# Patient Record
Sex: Female | Born: 1970
Health system: Southern US, Community
[De-identification: ages and names within clinical notes are randomized; demographics above are authoritative.]

## PROBLEM LIST (undated history)

## (undated) DIAGNOSIS — I509 Heart failure, unspecified: Secondary | ICD-10-CM

## (undated) DIAGNOSIS — I10 Essential (primary) hypertension: Secondary | ICD-10-CM

## (undated) DIAGNOSIS — G4733 Obstructive sleep apnea (adult) (pediatric): Secondary | ICD-10-CM

## (undated) DIAGNOSIS — E119 Type 2 diabetes mellitus without complications: Secondary | ICD-10-CM

## (undated) DIAGNOSIS — I2510 Atherosclerotic heart disease of native coronary artery without angina pectoris: Secondary | ICD-10-CM

## (undated) HISTORY — DX: Obstructive sleep apnea (adult) (pediatric): G47.33

---

## 2011-02-09 ENCOUNTER — Other Ambulatory Visit: Payer: Self-pay | Admitting: Orthopedic Surgery

## 2011-02-09 DIAGNOSIS — M545 Low back pain: Secondary | ICD-10-CM

## 2011-02-14 ENCOUNTER — Ambulatory Visit
Admission: RE | Admit: 2011-02-14 | Discharge: 2011-02-14 | Disposition: A | Discharge status: Home / Self Care | Payer: Worker's Compensation | Source: Ambulatory Visit | Attending: Orthopedic Surgery | Admitting: Orthopedic Surgery

## 2011-02-14 DIAGNOSIS — M545 Low back pain: Secondary | ICD-10-CM

## 2011-05-04 ENCOUNTER — Encounter (HOSPITAL_COMMUNITY)
Admission: RE | Admit: 2011-05-04 | Discharge: 2011-05-04 | Disposition: A | Discharge status: Home / Self Care | Payer: Worker's Compensation | Source: Ambulatory Visit | Attending: Orthopedic Surgery | Admitting: Orthopedic Surgery

## 2011-05-04 ENCOUNTER — Other Ambulatory Visit (HOSPITAL_COMMUNITY): Payer: Self-pay | Admitting: Orthopedic Surgery

## 2011-05-04 ENCOUNTER — Ambulatory Visit (HOSPITAL_COMMUNITY)
Admission: RE | Admit: 2011-05-04 | Discharge: 2011-05-04 | Disposition: A | Discharge status: Home / Self Care | Payer: Worker's Compensation | Source: Ambulatory Visit | Attending: Orthopedic Surgery | Admitting: Orthopedic Surgery

## 2011-05-04 DIAGNOSIS — Z01812 Encounter for preprocedural laboratory examination: Secondary | ICD-10-CM | POA: Insufficient documentation

## 2011-05-04 DIAGNOSIS — M5126 Other intervertebral disc displacement, lumbar region: Secondary | ICD-10-CM

## 2011-05-04 DIAGNOSIS — Z01818 Encounter for other preprocedural examination: Secondary | ICD-10-CM | POA: Insufficient documentation

## 2011-05-04 DIAGNOSIS — Z01810 Encounter for preprocedural cardiovascular examination: Secondary | ICD-10-CM | POA: Insufficient documentation

## 2011-05-04 LAB — DIFFERENTIAL
Basophils Relative: 0 % (ref 0–1)
Eosinophils Absolute: 0.2 10*3/uL (ref 0.0–0.7)
Eosinophils Relative: 2 % (ref 0–5)
Lymphs Abs: 2.5 10*3/uL (ref 0.7–4.0)
Monocytes Absolute: 0.6 10*3/uL (ref 0.1–1.0)
Monocytes Relative: 5 % (ref 3–12)
Neutrophils Relative %: 71 % (ref 43–77)

## 2011-05-04 LAB — CBC
MCH: 24.4 pg — ABNORMAL LOW (ref 26.0–34.0)
MCHC: 31.5 g/dL (ref 30.0–36.0)
MCV: 77.5 fL — ABNORMAL LOW (ref 78.0–100.0)
Platelets: 367 10*3/uL (ref 150–400)
RDW: 17.9 % — ABNORMAL HIGH (ref 11.5–15.5)

## 2011-05-04 LAB — URINALYSIS, ROUTINE W REFLEX MICROSCOPIC
Ketones, ur: NEGATIVE mg/dL
Leukocytes, UA: NEGATIVE
Nitrite: NEGATIVE
Protein, ur: NEGATIVE mg/dL

## 2011-05-04 LAB — COMPREHENSIVE METABOLIC PANEL
ALT: 30 U/L (ref 0–35)
CO2: 26 mEq/L (ref 19–32)
Calcium: 10 mg/dL (ref 8.4–10.5)
GFR calc Af Amer: >60 mL/min (ref >60)
GFR calc non Af Amer: >60 mL/min (ref >60)
Glucose, Bld: 264 mg/dL — ABNORMAL HIGH (ref 70–99)
Sodium: 135 mEq/L (ref 135–145)
Total Bilirubin: 0.2 mg/dL — ABNORMAL LOW (ref 0.3–1.2)

## 2011-05-04 LAB — TYPE AND SCREEN

## 2011-05-04 LAB — APTT: aPTT: 26 seconds (ref 24–37)

## 2011-05-04 LAB — ABO/RH: ABO/RH(D): A POS

## 2011-05-08 ENCOUNTER — Other Ambulatory Visit: Payer: Self-pay | Admitting: Orthopedic Surgery

## 2011-05-08 ENCOUNTER — Inpatient Hospital Stay (HOSPITAL_COMMUNITY)
Admission: RE | Admit: 2011-05-08 | Discharge: 2011-05-11 | DRG: 491 | Disposition: A | Discharge status: Home / Self Care | Payer: Worker's Compensation | Source: Ambulatory Visit | Attending: Orthopedic Surgery | Admitting: Orthopedic Surgery

## 2011-05-08 ENCOUNTER — Inpatient Hospital Stay (HOSPITAL_COMMUNITY): Payer: Worker's Compensation

## 2011-05-08 DIAGNOSIS — M5126 Other intervertebral disc displacement, lumbar region: Principal | ICD-10-CM | POA: Diagnosis present

## 2011-05-08 DIAGNOSIS — F341 Dysthymic disorder: Secondary | ICD-10-CM | POA: Diagnosis present

## 2011-05-08 DIAGNOSIS — Z794 Long term (current) use of insulin: Secondary | ICD-10-CM

## 2011-05-08 DIAGNOSIS — I209 Angina pectoris, unspecified: Secondary | ICD-10-CM | POA: Diagnosis present

## 2011-05-08 DIAGNOSIS — I10 Essential (primary) hypertension: Secondary | ICD-10-CM | POA: Diagnosis present

## 2011-05-08 DIAGNOSIS — Z7902 Long term (current) use of antithrombotics/antiplatelets: Secondary | ICD-10-CM

## 2011-05-08 DIAGNOSIS — I2510 Atherosclerotic heart disease of native coronary artery without angina pectoris: Secondary | ICD-10-CM | POA: Diagnosis present

## 2011-05-08 DIAGNOSIS — D649 Anemia, unspecified: Secondary | ICD-10-CM | POA: Diagnosis present

## 2011-05-08 DIAGNOSIS — E119 Type 2 diabetes mellitus without complications: Secondary | ICD-10-CM | POA: Diagnosis present

## 2011-05-08 DIAGNOSIS — Z7982 Long term (current) use of aspirin: Secondary | ICD-10-CM

## 2011-05-08 DIAGNOSIS — Z86718 Personal history of other venous thrombosis and embolism: Secondary | ICD-10-CM

## 2011-05-08 LAB — GLUCOSE, CAPILLARY
Glucose-Capillary: 201 mg/dL — ABNORMAL HIGH (ref 70–99)
Glucose-Capillary: 254 mg/dL — ABNORMAL HIGH (ref 70–99)

## 2011-05-09 LAB — GLUCOSE, CAPILLARY
Glucose-Capillary: 276 mg/dL — ABNORMAL HIGH (ref 70–99)
Glucose-Capillary: 252 mg/dL — ABNORMAL HIGH (ref 70–99)
Glucose-Capillary: 232 mg/dL — ABNORMAL HIGH (ref 70–99)

## 2011-05-10 DIAGNOSIS — M7989 Other specified soft tissue disorders: Secondary | ICD-10-CM

## 2011-05-10 LAB — GLUCOSE, CAPILLARY: Glucose-Capillary: 204 mg/dL — ABNORMAL HIGH (ref 70–99)

## 2011-05-11 LAB — GLUCOSE, CAPILLARY: Glucose-Capillary: 157 mg/dL — ABNORMAL HIGH (ref 70–99)

## 2011-05-16 NOTE — H&P (Signed)
  NAME:  Julie Hernandez, Julie Hernandez                ACCOUNT NO.:  [id number]  MEDICAL RECORD NO.:  LOCATION:                                 FACILITY:  PHYSICIAN:  Nelda Severe, MD      DATE OF BIRTH:  1971/01/29  DATE OF ADMISSION: DATE OF DISCHARGE:                             HISTORY & PHYSICAL   CHIEF COMPLAINT:  L5-S1 herniated disk, right side sciatic pain.  CURRENT MEDICATIONS: 1. Zetia 10 mg. 2. Robaxin 500 mg. 3. Ramipril 2.5 mg. 4. Plavix 75 mg. 5. NovoLog 100 units. 6. Nitroglycerin 0.4 p.r.n. 7. Mirapex 0.25 mg. 8. Maxzide 75 mg/50 mg. 9. Lexapro 20 mg. 10.Levemir insulin pen. 11.Glimepiride 4 mg. 12.Furosemide 20 mg. 13.Bystolic 5 mg. 14.Aspirin 325. 15.Alprazolam 0.5. 16.Aciphex 20 mg. 17.Neurontin 600 mg. 18.Norco 08/324.  MEDICAL HISTORY:  Coronary artery disease, angina, hypertension, diabetes, anemia, history of blood clots, depression, anxiety.  FAMILY HISTORY:  Coronary artery disease, hypertension, diabetes, congestive heart failure.  REVIEW OF SYSTEMS:  She reports no fever, no chills.  No shortness of breath.  No chest pain.  No hemoptysis.  No melena.  She does have migraine.  No chronic cough.  PHYSICAL EXAMINATION:  HEENT:  In general, she appears normocephalic. Pupils are equal, round, and reactive to light. NECK:  Stiff with full range of motion without pain. CHEST:  Clear to auscultation.  No wheezes were noted. HEART:  Regular rate and rhythm.  No murmur was noted. ABDOMEN:  Soft, nontender to palpation.  Positive bowel sounds were auscultated. EXTREMITIES:  She has right posterior sciatic pain with straight leg raise.  She walks with a limp on the right, and most of her pain is concentrated in the right buttock.  Skin on the lumbar area is clean, dry, and intact.  DIAGNOSIS:  L5-S1 right-sided disk herniation, compression of S1 right- sided nerve.  PLAN:  Diskectomy, L5-S1 right side by Dr. Nelda Severe.     Julie Hernandez,  P.A.   ______________________________ Nelda Severe, MD    MC/MEDQ  D:  05/04/2011  T:  05/05/2011  Job:  [id number]  Electronically Signed by Julie Hernandez P.A. on 05/09/2011 04:38:19 PM Electronically Signed by Nelda Severe MD on 05/16/2011 05:12:25 PM

## 2011-05-16 NOTE — Op Note (Signed)
NAME:  Julie Hernandez, Julie Hernandez                 ACCOUNT NO.:  [id number]  MEDICAL RECORD NO.:  [id number]  LOCATION:  3311                         FACILITY:  [facility]  PHYSICIAN:  Nelda Severe, MD      DATE OF BIRTH:  23-Mar-1971  DATE OF PROCEDURE:  05/08/2011 DATE OF DISCHARGE:                              OPERATIVE REPORT   SURGEON:  Nelda Severe, MD  ASSISTANT:  [name], PA-C  PREOPERATIVE DIAGNOSES:  Right L5-S1 disk protrusion, morbid obesity.  POSTOPERATIVE DIAGNOSES:  Right L5-S1 disk protrusion, morbid obesity.  OPERATIVE PROCEDURE:  Right L5-S1 laminotomy and disk excision.  OPERATIVE NOTE:  The patient was placed under general endotracheal anesthesia.  A gram of vancomycin was infused intravenously.  Later she was given a 400 mg dose of Cipro for a urinary tract infection.  She was positioned prone on a Jackson frame.  Care was taken to position the upper extremities so as to avoid hyperflexion and abduction of the shoulders so as to avoid hyperflexion of the elbows.  Upper extremities were padded with foam from axilla to hands.  Thighs, knees, shins, and ankles were supported on pillows.  The lumbosacral region was prepped with DuraPrep and draped in rectangular fashion.  The drapes secured with Ioban.  A time-out was held during which time usual parameters were confirmed/discussed.  A slightly right of midline incision was made what I perceived to be the lumbosacral level, although I could not palpate any spinous processes because of this woman's marked obesity.  The skin was initially scored, then the subcutaneous tissue was injected with 0.25% Marcaine/1% lidocaine with epinephrine.  Dissection was deepened down through a thick adipose layer using cutting current until the thoracolumbar fascia was encountered.  The incision in thoracolumbar fascia was made just to the right of midline and the paraspinal muscles mobilized laterally. What was perceived probably be  the spinous process of L5 was identified and a Kocher clamp placed.  Cross-table lateral radiograph confirmed the level.  Later radiograph was taken with a probe in the L5-S1 disk space to confirm that we were at the correct level and provide documentation.  Once the x-ray has been taken and the interlaminar space at L5-S1 have been cleared of soft tissue, a deep Taylor retractor was placed lateral to the apophyseal joint, right L5-S1.  A high-speed bur was used to perform a very conservative medial facetectomy and laminectomy extending into the trailing edge of the L5 lamina on the right side.  Ligamentum flavum was detached from the upper edge of the sacrum using curettes. This was the entry point for a 3-mm Kerrison rongeur was used to extend the laminectomy slightly distally into S1 and then worked our way proximally along the medial border of the superior articular process of S1, the overlying L5 inferior articular process having been removed with a high-speed bur.  We eventually exposed the proximal part of the neural foramen at L5-S1 and mobilized the dura in origins of the S1 nerve root medially off a hard knuckle of contained disk.  There were numerous dilated epidural veins which were bipolar coagulated and some controlled with Gelfoam which was removed subsequently.  Once  hemostasis had been achieved and the dura was adequately retracted using a D'Errico retractor, the disk was incised.  It was enucleated using combination of curettes and pituitary rongeurs.  The annulus was removed almost to the midline.  This resulted in good decompression of the S1 nerve root.  The disk space itself was probed and there were no loose fragments evident.  Cross-table lateral radiograph was taken with a probe in the disk space to document the correct level.  We then injected FloSeal into the disk space and into the laminectomy defect.  We then placed a 15-gauge Blake drain subfascially  brought out through the skin to the right side where it was secured with a 2-0 nylon suture.  Thoracolumbar fascia was closed using interrupted #1 Vicryl suture.  One-eighth inch Hemovac drain was placed in the subcutaneous layer and brought out through the skin to the right side and was secured with 2-0 nylon suture.  The subcutaneous layer was closed using interrupted inverted 0 Vicryl sutures.  The skin was closed using 2-0 nylon in vertical mattress fashion.  Blood loss estimated at less than 100 mL.  There was no intraoperative complications.  The sponge and needle counts were correct.  A nonadherent dressing was applied.  At the time of dictation, the patient is not yet returned to recovery room so no neurologic examination is reported here, however, there is no reason to expect that there would be any neurologic deficit and there were no intraoperative complications.     Nelda Severe, MD     MT/MEDQ  D:  05/08/2011  T:  05/09/2011  Job:  [id number]  Electronically Signed by Nelda Severe MD on 05/16/2011 05:12:29 PM

## 2011-06-21 NOTE — Discharge Summary (Signed)
  NAME:  Julie Hernandez, Julie Hernandez                 ACCOUNT NO.:  [id number]  MEDICAL RECORD NO.:  [id number]  LOCATION:                                 FACILITY:  PHYSICIAN:  Nelda Severe, MD      DATE OF BIRTH:  22-Oct-1971  DATE OF ADMISSION: DATE OF DISCHARGE:                              DISCHARGE SUMMARY   ADDENDUM  [name] was set for discharge yesterday when she suddenly had lower extremity pitting edema from the knees down.  She does have a significant cardiac history.  She has had cardiac catheterization with stent placement.  She is on multiple medications to include Plavix, which was restarted yesterday.  Due to her significant cardiac history and multiple medications, we ordered a Doppler study, which was negative, and did show a Baker cyst.  This morning, she was reexamined. She does have pitting edema anterior shin.  She is on Lasix and she has had increased fluid.  Due to her surgery, there is a possibility of a blood clot, so we chose to keep her here for close observation.  Today, her calves are nontender.  There is no erythema.  There is pitting edema anterior shins from the knees down to the ankle.  I told her to walk multiple times a day for very short distances, proper feed up when at rest, lay in the supine position.  Active range of motion of feet and ankle was encouraged.  She is going to wear a TED hose, which were placed yesterday back on and we were also recommending a hospital bed because of her stature and equipment she has at home.  She is 62 inches, weighs 226 pounds, and has a very tall bed.  She still has pain with movement.  She is postop day #2 now.  The hospital bed would significantly increase her functional ability and increase her recovery in a more fast pace due to her inability to get in her own bed at home and for comfort purposes.  This patient will be discharged in the care of her family.  She does have her other equipment, rolling walker and  a bedside toilet.  She will follow up in our office in approximately 3 weeks.  No bending, lifting, stooping is recommended.  Regular diet. Her disposition is stable at this point.  Negative Doppler studies for DVT.  Continue on her Plavix, this is day 2 with Plavix postoperatively, and sutures will come out in 3 weeks at her visit.  She can call with any questions or concerns.     [name], P.A.   ______________________________ Nelda Severe, MD    MC/MEDQ  D:  05/11/2011  T:  05/11/2011  Job:  [id number]  Electronically Signed by [name] P.A. on 05/18/2011 10:15:50 AM Electronically Signed by Nelda Severe MD on 06/21/2011 02:51:53 PM

## 2011-06-21 NOTE — Discharge Summary (Signed)
  NAME:  Julie Hernandez, Julie Hernandez                 ACCOUNT NO.:  [id number]  MEDICAL RECORD NO.:  [id number]  LOCATION:  5016                         FACILITY:  [facility]  PHYSICIAN:  Nelda Severe, MD      DATE OF BIRTH:  17-Apr-1971  DATE OF ADMISSION:  05/08/2011 DATE OF DISCHARGE:                              DISCHARGE SUMMARY   FINAL DIAGNOSIS:  L5-S1 right-sided disk herniation.  BRIEF HISTORY:  She was taken to the operating room by Dr. Nelda Severe on May 08, 2011, and underwent laminotomy and diskectomy right side at L5-S1.  Assistant, [name] PA-C.  She had less than 100 mL of blood loss.  Grossly neurovascularly motor intact.  Postoperatively, we did send her to a step-down unit overnight secondary to history of cardiac event.  She did well.  Postop day #1, afebrile.  Vital signs were stable.  No cardiac arrhythmias.  Distally neurovascularly motor intact.  She states her right leg felt significantly better.  Dressing is clean and dry.  Drains were intact.  She was transferred to 5000. Today, postop day #2, she is doing well.  She is moving about her room independently with a rolling walker.  Drains were discontinued. Incision is clean, dry, and intact.  No active drainage.  We will restart her Plavix 75 mg p.o. today.  She will take her regular medicines at home that she was taking prior to surgery.  Please see med rec list.  I am sending her home with hydrocodone 10/325 one to two q. 4 p.r.n. for pain, Robaxin 500 one to two q. 6 p.r.n. for muscle spasms.  DISPOSITION:  Stable.  DIET:  Regular.  She will follow up in our office in approximately 3 weeks for suture removal.  We encouraged walking for exercise.  No bending, stooping, or lifting.  FINAL DIAGNOSIS:  L5-S1 right-sided disk herniation.     [name], P.A.   ______________________________ Nelda Severe, MD    MC/MEDQ  D:  05/10/2011  T:  05/10/2011  Job:  [id number]  Electronically Signed by [name] P.A. on 05/18/2011 10:15:43 AM Electronically Signed by Nelda Severe MD on 06/21/2011 02:51:49 PM

## 2011-07-04 ENCOUNTER — Other Ambulatory Visit (HOSPITAL_COMMUNITY): Payer: Self-pay | Admitting: Orthopedic Surgery

## 2011-07-04 DIAGNOSIS — IMO0002 Reserved for concepts with insufficient information to code with codable children: Secondary | ICD-10-CM

## 2011-07-12 ENCOUNTER — Ambulatory Visit (HOSPITAL_COMMUNITY)
Admission: RE | Admit: 2011-07-12 | Discharge: 2011-07-12 | Disposition: A | Discharge status: Home / Self Care | Payer: Worker's Compensation | Source: Ambulatory Visit | Attending: Orthopedic Surgery | Admitting: Orthopedic Surgery

## 2011-07-12 DIAGNOSIS — M5124 Other intervertebral disc displacement, thoracic region: Secondary | ICD-10-CM | POA: Insufficient documentation

## 2011-07-12 DIAGNOSIS — M79609 Pain in unspecified limb: Secondary | ICD-10-CM | POA: Insufficient documentation

## 2011-07-12 DIAGNOSIS — IMO0002 Reserved for concepts with insufficient information to code with codable children: Secondary | ICD-10-CM

## 2011-07-12 DIAGNOSIS — M519 Unspecified thoracic, thoracolumbar and lumbosacral intervertebral disc disorder: Secondary | ICD-10-CM | POA: Insufficient documentation

## 2011-07-12 DIAGNOSIS — IMO0001 Reserved for inherently not codable concepts without codable children: Secondary | ICD-10-CM | POA: Insufficient documentation

## 2011-07-12 DIAGNOSIS — M549 Dorsalgia, unspecified: Secondary | ICD-10-CM | POA: Insufficient documentation

## 2011-07-12 DIAGNOSIS — M48061 Spinal stenosis, lumbar region without neurogenic claudication: Secondary | ICD-10-CM | POA: Insufficient documentation

## 2011-07-12 MED ORDER — IOHEXOL 180 MG/ML  SOLN
16.0000 mL | Freq: Once | INTRAMUSCULAR | Status: AC | PRN
  Administered 2011-07-12: 16 mL via INTRATHECAL

## 2012-04-30 ENCOUNTER — Other Ambulatory Visit: Payer: Self-pay | Admitting: Orthopedic Surgery

## 2012-04-30 DIAGNOSIS — M79604 Pain in right leg: Secondary | ICD-10-CM

## 2012-04-30 DIAGNOSIS — M545 Low back pain: Secondary | ICD-10-CM

## 2012-05-07 ENCOUNTER — Other Ambulatory Visit: Payer: Worker's Compensation

## 2017-03-19 DIAGNOSIS — F419 Anxiety disorder, unspecified: Secondary | ICD-10-CM | POA: Insufficient documentation

## 2017-03-19 DIAGNOSIS — F32A Depression, unspecified: Secondary | ICD-10-CM | POA: Insufficient documentation

## 2017-09-05 DIAGNOSIS — I2510 Atherosclerotic heart disease of native coronary artery without angina pectoris: Secondary | ICD-10-CM | POA: Diagnosis present

## 2017-09-17 DIAGNOSIS — I209 Angina pectoris, unspecified: Secondary | ICD-10-CM | POA: Insufficient documentation

## 2017-09-17 DIAGNOSIS — E785 Hyperlipidemia, unspecified: Secondary | ICD-10-CM | POA: Insufficient documentation

## 2018-01-08 DIAGNOSIS — Z0001 Encounter for general adult medical examination with abnormal findings: Secondary | ICD-10-CM | POA: Diagnosis not present

## 2018-01-08 DIAGNOSIS — E559 Vitamin D deficiency, unspecified: Secondary | ICD-10-CM | POA: Diagnosis not present

## 2018-01-08 DIAGNOSIS — Z1331 Encounter for screening for depression: Secondary | ICD-10-CM | POA: Diagnosis not present

## 2018-01-08 DIAGNOSIS — G894 Chronic pain syndrome: Secondary | ICD-10-CM | POA: Diagnosis not present

## 2018-01-08 DIAGNOSIS — Z1389 Encounter for screening for other disorder: Secondary | ICD-10-CM | POA: Diagnosis not present

## 2018-01-08 DIAGNOSIS — R609 Edema, unspecified: Secondary | ICD-10-CM | POA: Diagnosis not present

## 2018-01-08 DIAGNOSIS — D649 Anemia, unspecified: Secondary | ICD-10-CM | POA: Diagnosis not present

## 2018-01-08 DIAGNOSIS — I10 Essential (primary) hypertension: Secondary | ICD-10-CM | POA: Diagnosis not present

## 2018-01-08 DIAGNOSIS — F320 Major depressive disorder, single episode, mild: Secondary | ICD-10-CM | POA: Diagnosis not present

## 2018-01-08 DIAGNOSIS — E1165 Type 2 diabetes mellitus with hyperglycemia: Secondary | ICD-10-CM | POA: Diagnosis not present

## 2018-01-08 DIAGNOSIS — E782 Mixed hyperlipidemia: Secondary | ICD-10-CM | POA: Diagnosis not present

## 2018-01-08 DIAGNOSIS — K219 Gastro-esophageal reflux disease without esophagitis: Secondary | ICD-10-CM | POA: Diagnosis not present

## 2018-02-10 DIAGNOSIS — Z1231 Encounter for screening mammogram for malignant neoplasm of breast: Secondary | ICD-10-CM | POA: Diagnosis not present

## 2018-02-11 DIAGNOSIS — G43719 Chronic migraine without aura, intractable, without status migrainosus: Secondary | ICD-10-CM | POA: Diagnosis not present

## 2018-02-11 DIAGNOSIS — Z719 Counseling, unspecified: Secondary | ICD-10-CM | POA: Diagnosis not present

## 2018-05-06 DIAGNOSIS — Z719 Counseling, unspecified: Secondary | ICD-10-CM | POA: Diagnosis not present

## 2018-05-06 DIAGNOSIS — G43719 Chronic migraine without aura, intractable, without status migrainosus: Secondary | ICD-10-CM | POA: Diagnosis not present

## 2018-05-13 DIAGNOSIS — G894 Chronic pain syndrome: Secondary | ICD-10-CM | POA: Diagnosis not present

## 2018-05-13 DIAGNOSIS — E782 Mixed hyperlipidemia: Secondary | ICD-10-CM | POA: Diagnosis not present

## 2018-05-13 DIAGNOSIS — K219 Gastro-esophageal reflux disease without esophagitis: Secondary | ICD-10-CM | POA: Diagnosis not present

## 2018-05-13 DIAGNOSIS — F411 Generalized anxiety disorder: Secondary | ICD-10-CM | POA: Diagnosis not present

## 2018-05-13 DIAGNOSIS — F17290 Nicotine dependence, other tobacco product, uncomplicated: Secondary | ICD-10-CM | POA: Diagnosis not present

## 2018-05-13 DIAGNOSIS — E559 Vitamin D deficiency, unspecified: Secondary | ICD-10-CM | POA: Diagnosis not present

## 2018-05-13 DIAGNOSIS — R609 Edema, unspecified: Secondary | ICD-10-CM | POA: Diagnosis not present

## 2018-05-13 DIAGNOSIS — I10 Essential (primary) hypertension: Secondary | ICD-10-CM | POA: Diagnosis not present

## 2018-05-13 DIAGNOSIS — F320 Major depressive disorder, single episode, mild: Secondary | ICD-10-CM | POA: Diagnosis not present

## 2018-05-13 DIAGNOSIS — D649 Anemia, unspecified: Secondary | ICD-10-CM | POA: Diagnosis not present

## 2018-05-13 DIAGNOSIS — E118 Type 2 diabetes mellitus with unspecified complications: Secondary | ICD-10-CM | POA: Diagnosis not present

## 2018-05-13 DIAGNOSIS — E1159 Type 2 diabetes mellitus with other circulatory complications: Secondary | ICD-10-CM | POA: Diagnosis not present

## 2018-05-14 DIAGNOSIS — D649 Anemia, unspecified: Secondary | ICD-10-CM | POA: Diagnosis not present

## 2018-05-14 DIAGNOSIS — E1159 Type 2 diabetes mellitus with other circulatory complications: Secondary | ICD-10-CM | POA: Diagnosis not present

## 2018-05-14 DIAGNOSIS — E782 Mixed hyperlipidemia: Secondary | ICD-10-CM | POA: Diagnosis not present

## 2018-05-14 DIAGNOSIS — I10 Essential (primary) hypertension: Secondary | ICD-10-CM | POA: Diagnosis not present

## 2018-05-14 DIAGNOSIS — E118 Type 2 diabetes mellitus with unspecified complications: Secondary | ICD-10-CM | POA: Diagnosis not present

## 2018-05-14 DIAGNOSIS — E559 Vitamin D deficiency, unspecified: Secondary | ICD-10-CM | POA: Diagnosis not present

## 2018-08-12 DIAGNOSIS — K219 Gastro-esophageal reflux disease without esophagitis: Secondary | ICD-10-CM | POA: Diagnosis not present

## 2018-08-12 DIAGNOSIS — I10 Essential (primary) hypertension: Secondary | ICD-10-CM | POA: Diagnosis not present

## 2018-08-12 DIAGNOSIS — R609 Edema, unspecified: Secondary | ICD-10-CM | POA: Diagnosis not present

## 2018-08-12 DIAGNOSIS — G894 Chronic pain syndrome: Secondary | ICD-10-CM | POA: Diagnosis not present

## 2018-08-12 DIAGNOSIS — E782 Mixed hyperlipidemia: Secondary | ICD-10-CM | POA: Diagnosis not present

## 2018-08-12 DIAGNOSIS — F17290 Nicotine dependence, other tobacco product, uncomplicated: Secondary | ICD-10-CM | POA: Diagnosis not present

## 2018-08-12 DIAGNOSIS — F320 Major depressive disorder, single episode, mild: Secondary | ICD-10-CM | POA: Diagnosis not present

## 2018-08-12 DIAGNOSIS — D649 Anemia, unspecified: Secondary | ICD-10-CM | POA: Diagnosis not present

## 2018-08-12 DIAGNOSIS — E559 Vitamin D deficiency, unspecified: Secondary | ICD-10-CM | POA: Diagnosis not present

## 2018-08-12 DIAGNOSIS — F411 Generalized anxiety disorder: Secondary | ICD-10-CM | POA: Diagnosis not present

## 2018-08-12 DIAGNOSIS — E1169 Type 2 diabetes mellitus with other specified complication: Secondary | ICD-10-CM | POA: Diagnosis not present

## 2018-08-12 DIAGNOSIS — M25519 Pain in unspecified shoulder: Secondary | ICD-10-CM | POA: Diagnosis not present

## 2018-10-01 DIAGNOSIS — G43901 Migraine, unspecified, not intractable, with status migrainosus: Secondary | ICD-10-CM | POA: Diagnosis not present

## 2018-10-01 DIAGNOSIS — Z719 Counseling, unspecified: Secondary | ICD-10-CM | POA: Diagnosis not present

## 2018-10-01 DIAGNOSIS — R51 Headache: Secondary | ICD-10-CM | POA: Diagnosis not present

## 2018-10-01 DIAGNOSIS — G43719 Chronic migraine without aura, intractable, without status migrainosus: Secondary | ICD-10-CM | POA: Diagnosis not present

## 2019-03-03 DIAGNOSIS — Z719 Counseling, unspecified: Secondary | ICD-10-CM | POA: Diagnosis not present

## 2019-03-03 DIAGNOSIS — G43719 Chronic migraine without aura, intractable, without status migrainosus: Secondary | ICD-10-CM | POA: Diagnosis not present

## 2019-05-13 DIAGNOSIS — Z1389 Encounter for screening for other disorder: Secondary | ICD-10-CM | POA: Diagnosis not present

## 2019-05-13 DIAGNOSIS — E782 Mixed hyperlipidemia: Secondary | ICD-10-CM | POA: Diagnosis not present

## 2019-05-13 DIAGNOSIS — E663 Overweight: Secondary | ICD-10-CM | POA: Diagnosis not present

## 2019-05-13 DIAGNOSIS — Z0001 Encounter for general adult medical examination with abnormal findings: Secondary | ICD-10-CM | POA: Diagnosis not present

## 2019-05-13 DIAGNOSIS — Z6827 Body mass index (BMI) 27.0-27.9, adult: Secondary | ICD-10-CM | POA: Diagnosis not present

## 2019-05-13 DIAGNOSIS — D649 Anemia, unspecified: Secondary | ICD-10-CM | POA: Diagnosis not present

## 2019-05-13 DIAGNOSIS — R609 Edema, unspecified: Secondary | ICD-10-CM | POA: Diagnosis not present

## 2019-05-13 DIAGNOSIS — Z1231 Encounter for screening mammogram for malignant neoplasm of breast: Secondary | ICD-10-CM | POA: Diagnosis not present

## 2019-05-13 DIAGNOSIS — Z1331 Encounter for screening for depression: Secondary | ICD-10-CM | POA: Diagnosis not present

## 2019-05-13 DIAGNOSIS — E1169 Type 2 diabetes mellitus with other specified complication: Secondary | ICD-10-CM | POA: Diagnosis not present

## 2019-05-13 DIAGNOSIS — I10 Essential (primary) hypertension: Secondary | ICD-10-CM | POA: Diagnosis not present

## 2019-05-13 DIAGNOSIS — Z124 Encounter for screening for malignant neoplasm of cervix: Secondary | ICD-10-CM | POA: Diagnosis not present

## 2019-05-13 DIAGNOSIS — R5383 Other fatigue: Secondary | ICD-10-CM | POA: Diagnosis not present

## 2019-05-13 DIAGNOSIS — Z79899 Other long term (current) drug therapy: Secondary | ICD-10-CM | POA: Diagnosis not present

## 2019-05-13 DIAGNOSIS — F17290 Nicotine dependence, other tobacco product, uncomplicated: Secondary | ICD-10-CM | POA: Diagnosis not present

## 2019-05-13 DIAGNOSIS — E559 Vitamin D deficiency, unspecified: Secondary | ICD-10-CM | POA: Diagnosis not present

## 2019-08-25 DIAGNOSIS — Z719 Counseling, unspecified: Secondary | ICD-10-CM | POA: Diagnosis not present

## 2019-08-25 DIAGNOSIS — G43719 Chronic migraine without aura, intractable, without status migrainosus: Secondary | ICD-10-CM | POA: Diagnosis not present

## 2020-01-06 DIAGNOSIS — F320 Major depressive disorder, single episode, mild: Secondary | ICD-10-CM | POA: Diagnosis not present

## 2020-01-06 DIAGNOSIS — D509 Iron deficiency anemia, unspecified: Secondary | ICD-10-CM | POA: Diagnosis not present

## 2020-01-06 DIAGNOSIS — G629 Polyneuropathy, unspecified: Secondary | ICD-10-CM | POA: Diagnosis not present

## 2020-01-06 DIAGNOSIS — R609 Edema, unspecified: Secondary | ICD-10-CM | POA: Diagnosis not present

## 2020-01-06 DIAGNOSIS — E038 Other specified hypothyroidism: Secondary | ICD-10-CM | POA: Diagnosis not present

## 2020-01-06 DIAGNOSIS — I2510 Atherosclerotic heart disease of native coronary artery without angina pectoris: Secondary | ICD-10-CM | POA: Diagnosis not present

## 2020-01-06 DIAGNOSIS — I10 Essential (primary) hypertension: Secondary | ICD-10-CM | POA: Diagnosis not present

## 2020-01-06 DIAGNOSIS — Z6830 Body mass index (BMI) 30.0-30.9, adult: Secondary | ICD-10-CM | POA: Diagnosis not present

## 2020-01-06 DIAGNOSIS — E559 Vitamin D deficiency, unspecified: Secondary | ICD-10-CM | POA: Diagnosis not present

## 2020-01-06 DIAGNOSIS — E1169 Type 2 diabetes mellitus with other specified complication: Secondary | ICD-10-CM | POA: Diagnosis not present

## 2020-01-06 DIAGNOSIS — K219 Gastro-esophageal reflux disease without esophagitis: Secondary | ICD-10-CM | POA: Diagnosis not present

## 2020-01-06 DIAGNOSIS — G43909 Migraine, unspecified, not intractable, without status migrainosus: Secondary | ICD-10-CM | POA: Diagnosis not present

## 2020-01-06 DIAGNOSIS — E782 Mixed hyperlipidemia: Secondary | ICD-10-CM | POA: Diagnosis not present

## 2020-02-05 DIAGNOSIS — U071 COVID-19: Secondary | ICD-10-CM | POA: Diagnosis not present

## 2020-02-05 DIAGNOSIS — Z20828 Contact with and (suspected) exposure to other viral communicable diseases: Secondary | ICD-10-CM | POA: Diagnosis not present

## 2020-02-06 DIAGNOSIS — R0602 Shortness of breath: Secondary | ICD-10-CM | POA: Diagnosis not present

## 2020-02-06 DIAGNOSIS — F17200 Nicotine dependence, unspecified, uncomplicated: Secondary | ICD-10-CM | POA: Diagnosis not present

## 2020-02-06 DIAGNOSIS — I10 Essential (primary) hypertension: Secondary | ICD-10-CM | POA: Diagnosis not present

## 2020-02-06 DIAGNOSIS — I509 Heart failure, unspecified: Secondary | ICD-10-CM | POA: Diagnosis not present

## 2020-02-06 DIAGNOSIS — R079 Chest pain, unspecified: Secondary | ICD-10-CM | POA: Diagnosis not present

## 2020-02-06 DIAGNOSIS — I2510 Atherosclerotic heart disease of native coronary artery without angina pectoris: Secondary | ICD-10-CM | POA: Diagnosis not present

## 2020-02-06 DIAGNOSIS — G894 Chronic pain syndrome: Secondary | ICD-10-CM | POA: Diagnosis not present

## 2020-02-06 DIAGNOSIS — K219 Gastro-esophageal reflux disease without esophagitis: Secondary | ICD-10-CM | POA: Diagnosis not present

## 2020-02-06 DIAGNOSIS — F17210 Nicotine dependence, cigarettes, uncomplicated: Secondary | ICD-10-CM | POA: Diagnosis not present

## 2020-02-06 DIAGNOSIS — Z720 Tobacco use: Secondary | ICD-10-CM | POA: Diagnosis not present

## 2020-02-06 DIAGNOSIS — R5383 Other fatigue: Secondary | ICD-10-CM | POA: Diagnosis not present

## 2020-02-06 DIAGNOSIS — G2581 Restless legs syndrome: Secondary | ICD-10-CM | POA: Diagnosis not present

## 2020-02-06 DIAGNOSIS — Z20822 Contact with and (suspected) exposure to covid-19: Secondary | ICD-10-CM | POA: Diagnosis not present

## 2020-02-06 DIAGNOSIS — T82855A Stenosis of coronary artery stent, initial encounter: Secondary | ICD-10-CM | POA: Diagnosis not present

## 2020-02-06 DIAGNOSIS — Z8639 Personal history of other endocrine, nutritional and metabolic disease: Secondary | ICD-10-CM | POA: Diagnosis not present

## 2020-02-06 DIAGNOSIS — Z79899 Other long term (current) drug therapy: Secondary | ICD-10-CM | POA: Diagnosis not present

## 2020-02-06 DIAGNOSIS — I5189 Other ill-defined heart diseases: Secondary | ICD-10-CM | POA: Diagnosis not present

## 2020-02-06 DIAGNOSIS — E785 Hyperlipidemia, unspecified: Secondary | ICD-10-CM | POA: Diagnosis not present

## 2020-02-06 DIAGNOSIS — I110 Hypertensive heart disease with heart failure: Secondary | ICD-10-CM | POA: Diagnosis not present

## 2020-02-06 DIAGNOSIS — Z955 Presence of coronary angioplasty implant and graft: Secondary | ICD-10-CM | POA: Diagnosis not present

## 2020-02-06 DIAGNOSIS — R9431 Abnormal electrocardiogram [ECG] [EKG]: Secondary | ICD-10-CM | POA: Diagnosis not present

## 2020-02-06 DIAGNOSIS — I214 Non-ST elevation (NSTEMI) myocardial infarction: Secondary | ICD-10-CM | POA: Diagnosis not present

## 2020-02-06 DIAGNOSIS — I501 Left ventricular failure: Secondary | ICD-10-CM | POA: Diagnosis not present

## 2020-02-06 DIAGNOSIS — I252 Old myocardial infarction: Secondary | ICD-10-CM | POA: Diagnosis not present

## 2020-02-06 DIAGNOSIS — E119 Type 2 diabetes mellitus without complications: Secondary | ICD-10-CM | POA: Diagnosis not present

## 2020-02-06 DIAGNOSIS — Z7902 Long term (current) use of antithrombotics/antiplatelets: Secondary | ICD-10-CM | POA: Diagnosis not present

## 2020-02-09 DIAGNOSIS — I5189 Other ill-defined heart diseases: Secondary | ICD-10-CM | POA: Insufficient documentation

## 2020-03-09 DIAGNOSIS — I214 Non-ST elevation (NSTEMI) myocardial infarction: Secondary | ICD-10-CM | POA: Diagnosis not present

## 2020-03-16 DIAGNOSIS — I214 Non-ST elevation (NSTEMI) myocardial infarction: Secondary | ICD-10-CM | POA: Diagnosis not present

## 2020-03-22 ENCOUNTER — Ambulatory Visit
Admission: RE | Admit: 2020-03-22 | Discharge: 2020-03-22 | Disposition: A | Discharge status: Home / Self Care | Payer: Self-pay | Source: Ambulatory Visit | Attending: Internal Medicine | Admitting: Internal Medicine

## 2020-03-22 ENCOUNTER — Encounter (HOSPITAL_COMMUNITY): Payer: Self-pay | Admitting: *Deleted

## 2020-03-22 ENCOUNTER — Telehealth (HOSPITAL_COMMUNITY): Payer: Self-pay | Admitting: *Deleted

## 2020-03-22 ENCOUNTER — Other Ambulatory Visit: Payer: Self-pay

## 2020-03-22 ENCOUNTER — Other Ambulatory Visit (HOSPITAL_COMMUNITY): Payer: Self-pay | Admitting: *Deleted

## 2020-03-22 DIAGNOSIS — R079 Chest pain, unspecified: Secondary | ICD-10-CM

## 2020-03-22 DIAGNOSIS — I2510 Atherosclerotic heart disease of native coronary artery without angina pectoris: Secondary | ICD-10-CM

## 2020-03-22 NOTE — Telephone Encounter (Signed)
Cardiac MRI scheduled as requested by Dr.Bensimhon

## 2020-03-22 NOTE — Progress Notes (Signed)
car

## 2020-03-24 ENCOUNTER — Other Ambulatory Visit (HOSPITAL_COMMUNITY): Payer: Self-pay | Admitting: *Deleted

## 2020-03-24 MED ORDER — DIAZEPAM 5 MG PO TABS: ORAL_TABLET | ORAL | 0 refills | Status: DC

## 2020-03-24 NOTE — Telephone Encounter (Signed)
Spoke with pt she is aware of cardiac mri appt, directions, and pre medication instructions. Pt thanked me for the call.

## 2020-03-28 ENCOUNTER — Telehealth (HOSPITAL_COMMUNITY): Payer: Self-pay | Admitting: *Deleted

## 2020-03-28 NOTE — Telephone Encounter (Signed)
Reaching out to patient to offer assistance regarding upcoming cardiac imaging study; pt verbalizes understanding of appt date/time, parking situation and where to check in, medications ordered, and verified current allergies; name and call back number provided for further questions should they arise Rockwell Alexandria RN Navigator Cardiac Imaging [facility] [phone] office [phone] cell

## 2020-03-29 ENCOUNTER — Other Ambulatory Visit: Payer: Self-pay

## 2020-03-29 ENCOUNTER — Ambulatory Visit (HOSPITAL_COMMUNITY)
Admission: RE | Admit: 2020-03-29 | Discharge: 2020-03-29 | Disposition: A | Discharge status: Home / Self Care | Payer: PPO | Source: Ambulatory Visit | Attending: Internal Medicine | Admitting: Internal Medicine

## 2020-03-29 DIAGNOSIS — R079 Chest pain, unspecified: Secondary | ICD-10-CM | POA: Insufficient documentation

## 2020-03-29 MED ORDER — GADOBUTROL 1 MMOL/ML IV SOLN
10.0000 mL | Freq: Once | INTRAVENOUS | Status: AC | PRN
  Administered 2020-03-29: 10 mL via INTRAVENOUS

## 2020-04-01 ENCOUNTER — Other Ambulatory Visit: Payer: Self-pay

## 2020-04-01 ENCOUNTER — Ambulatory Visit (HOSPITAL_COMMUNITY)
Admission: RE | Admit: 2020-04-01 | Discharge: 2020-04-01 | Disposition: A | Discharge status: Home / Self Care | Payer: PPO | Source: Ambulatory Visit | Attending: Internal Medicine | Admitting: Internal Medicine

## 2020-04-01 VITALS — BP 118/78 | HR 75 | Wt 169.4 lb

## 2020-04-01 DIAGNOSIS — Z91040 Latex allergy status: Secondary | ICD-10-CM | POA: Diagnosis not present

## 2020-04-01 DIAGNOSIS — Z7982 Long term (current) use of aspirin: Secondary | ICD-10-CM | POA: Diagnosis not present

## 2020-04-01 DIAGNOSIS — Z87891 Personal history of nicotine dependence: Secondary | ICD-10-CM | POA: Diagnosis not present

## 2020-04-01 DIAGNOSIS — Z888 Allergy status to other drugs, medicaments and biological substances status: Secondary | ICD-10-CM | POA: Diagnosis not present

## 2020-04-01 DIAGNOSIS — E785 Hyperlipidemia, unspecified: Secondary | ICD-10-CM | POA: Insufficient documentation

## 2020-04-01 DIAGNOSIS — E119 Type 2 diabetes mellitus without complications: Secondary | ICD-10-CM | POA: Insufficient documentation

## 2020-04-01 DIAGNOSIS — Z79899 Other long term (current) drug therapy: Secondary | ICD-10-CM | POA: Diagnosis not present

## 2020-04-01 DIAGNOSIS — N181 Chronic kidney disease, stage 1: Secondary | ICD-10-CM

## 2020-04-01 DIAGNOSIS — I25118 Atherosclerotic heart disease of native coronary artery with other forms of angina pectoris: Secondary | ICD-10-CM

## 2020-04-01 DIAGNOSIS — Z955 Presence of coronary angioplasty implant and graft: Secondary | ICD-10-CM | POA: Diagnosis not present

## 2020-04-01 DIAGNOSIS — I513 Intracardiac thrombosis, not elsewhere classified: Secondary | ICD-10-CM | POA: Diagnosis not present

## 2020-04-01 DIAGNOSIS — I5022 Chronic systolic (congestive) heart failure: Secondary | ICD-10-CM

## 2020-04-01 DIAGNOSIS — G8929 Other chronic pain: Secondary | ICD-10-CM | POA: Diagnosis not present

## 2020-04-01 DIAGNOSIS — Z8249 Family history of ischemic heart disease and other diseases of the circulatory system: Secondary | ICD-10-CM | POA: Diagnosis not present

## 2020-04-01 DIAGNOSIS — E782 Mixed hyperlipidemia: Secondary | ICD-10-CM

## 2020-04-01 DIAGNOSIS — I255 Ischemic cardiomyopathy: Secondary | ICD-10-CM | POA: Insufficient documentation

## 2020-04-01 DIAGNOSIS — Z7902 Long term (current) use of antithrombotics/antiplatelets: Secondary | ICD-10-CM | POA: Diagnosis not present

## 2020-04-01 DIAGNOSIS — E1122 Type 2 diabetes mellitus with diabetic chronic kidney disease: Secondary | ICD-10-CM | POA: Diagnosis not present

## 2020-04-01 DIAGNOSIS — I252 Old myocardial infarction: Secondary | ICD-10-CM | POA: Diagnosis not present

## 2020-04-01 DIAGNOSIS — M549 Dorsalgia, unspecified: Secondary | ICD-10-CM | POA: Insufficient documentation

## 2020-04-01 DIAGNOSIS — Z881 Allergy status to other antibiotic agents status: Secondary | ICD-10-CM | POA: Insufficient documentation

## 2020-04-01 LAB — LIPID PANEL
Cholesterol: 227 mg/dL — ABNORMAL HIGH (ref 0–200)
HDL: 45 mg/dL (ref >40)
LDL Cholesterol: 145 mg/dL — ABNORMAL HIGH (ref 0–99)
Total CHOL/HDL Ratio: 5 RATIO
Triglycerides: 186 mg/dL — ABNORMAL HIGH (ref <150)
VLDL: 37 mg/dL (ref 0–40)

## 2020-04-01 LAB — COMPREHENSIVE METABOLIC PANEL
ALT: 18 U/L (ref 0–44)
AST: 17 U/L (ref 15–41)
Albumin: 4 g/dL (ref 3.5–5.0)
Alkaline Phosphatase: 87 U/L (ref 38–126)
Anion gap: 15 (ref 5–15)
BUN: 18 mg/dL (ref 6–20)
CO2: 22 mmol/L (ref 22–32)
Calcium: 9.7 mg/dL (ref 8.9–10.3)
Chloride: 101 mmol/L (ref 98–111)
Creatinine, Ser: 0.84 mg/dL (ref 0.44–1.00)
GFR calc Af Amer: >60 mL/min (ref >60)
GFR calc non Af Amer: >60 mL/min (ref >60)
Glucose, Bld: 276 mg/dL — ABNORMAL HIGH (ref 70–99)
Potassium: 4.5 mmol/L (ref 3.5–5.1)
Sodium: 138 mmol/L (ref 135–145)
Total Bilirubin: 0.6 mg/dL (ref 0.3–1.2)
Total Protein: 7 g/dL (ref 6.5–8.1)

## 2020-04-01 LAB — CBC
HCT: 49.7 % — ABNORMAL HIGH (ref 36.0–46.0)
Hemoglobin: 15.5 g/dL — ABNORMAL HIGH (ref 12.0–15.0)
MCH: 28.1 pg (ref 26.0–34.0)
MCHC: 31.2 g/dL (ref 30.0–36.0)
MCV: 90 fL (ref 80.0–100.0)
Platelets: 303 10*3/uL (ref 150–400)
RBC: 5.52 MIL/uL — ABNORMAL HIGH (ref 3.87–5.11)
RDW: 13.4 % (ref 11.5–15.5)
WBC: 7.8 10*3/uL (ref 4.0–10.5)
nRBC: 0 % (ref 0.0–0.2)

## 2020-04-01 LAB — PROTIME-INR
INR: 1 (ref 0.8–1.2)
Prothrombin Time: 12.3 seconds (ref 11.4–15.2)

## 2020-04-01 NOTE — Progress Notes (Signed)
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  Had anterior MI in 2010 taken emergently to [facility]. Stent was placed emergently. Post stenting she continued to have CP. Went to [facility] that same year and had a a second stent placed. Did well from a cardiac perspective since that time. Reports she has failed all statins due to intolerance. Has been on Zetia. Also has DM2 and says she previously was on insulin but now controls it with diet. Quit smoking 1 month ago,.   In March had NSTEMI. Had cath 02/08/20 at [facility]. 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 Was told she may need repeat stenting vs CABG. Referred here for second opinion.   She reports daily angina with mild to moderate activity which is mildly progressive. No severe angina or resting symptoms. I reviewed cath films with IC team prior to visit and cMRI ordered to assess anterior wall viability.    Has been requiring lasix to manage volume. Currently no orthopnea or PND. NYHA II-early III DOE.   cMRI 03/29/20 1. Subendocardial late gadolinium enhancement consistent with prior infarct in the LAD territory. There is >50% transmural LGE suggesting nonviability in the basal to apical anterior wall and apex. There is <50% transmural LGE suggesting viability in the basal to mid anteroseptum and apical septum  2.  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 Normal LV size with severe systolic dysfunction (EF 02%). Akinesis of basal to apical  anterior/anteroseptal walls and apex  4.  Small RV size with normal systolic function (EF 72%)    Review of Systems: [y] = yes, [ ]  = no   General: Weight gain [ ] ; Weight loss [ ] ; Anorexia [ ] ; Fatigue [ y]; Fever [ ] ; Chills [ ] ; Weakness [ ]   Cardiac: Chest pain/pressure Blue.Reese ]; Resting SOB [ ] ; Exertional SOB Blue.Reese ]; Orthopnea [ ] ; Pedal Edema [ y]; Palpitations [ ] ; Syncope [ ] ; Presyncope [ ] ; Paroxysmal nocturnal dyspnea[ ]   Pulmonary: Cough [ ] ; Wheezing[ ] ; Hemoptysis[ ] ; Sputum [ ] ; Snoring [ ]   GI: Vomiting[ ] ; Dysphagia[ ] ; Melena[ ] ; Hematochezia [ ] ; Heartburn[ ] ; Abdominal pain [ ] ; Constipation [ ] ; Diarrhea [ ] ; BRBPR [ ]   GU: Hematuria[ ] ; Dysuria [ ] ; Nocturia[ ]   Vascular: Pain in legs with walking [ ] ; Pain in feet with lying flat [ ] ; Non-healing sores [ ] ; Stroke [ ] ; TIA [ ] ; Slurred speech [ ] ;  Neuro: Headaches[ ] ; Vertigo[ ] ; Seizures[ ] ; Paresthesias[ ] ;Blurred vision [ ] ; Diplopia [ ] ; Vision changes [ ]   Ortho/Skin: Arthritis Blue.Reese ]; Joint pain Blue.Reese ]; Muscle pain [ ] ; Joint swelling [ ] ; Back Pain [ y]; Rash [ ]   Psych: Depression[ ] ; Anxiety[ ]   Heme: Bleeding problems [ ] ; Clotting disorders [ ] ; Anemia [ ]   Endocrine: Diabetes Blue.Reese ]; Thyroid dysfunction[ ]    No past medical history on file.  Current Outpatient Medications  Medication Sig Dispense Refill  . ALBUTEROL  IN Inhale into the lungs. Prn    . aspirin EC 81 MG tablet Take 81 mg by mouth daily.    . carvedilol (COREG) 3.125 MG tablet Take 3.125 mg by mouth 2 (two) times daily with a meal.    . clopidogrel (PLAVIX) 75 MG tablet Take 75 mg by mouth daily.    Marland Kitchen ezetimibe (ZETIA) 10 MG tablet Take 10 mg by mouth daily.    . famotidine (PEPCID) 20 MG tablet Take 20 mg by mouth 2 (two) times daily.    . furosemide (LASIX) 20 MG tablet Take 40 mg by mouth daily.    Marland Kitchen lisinopril (ZESTRIL) 5 MG tablet Take 5 mg by mouth daily.    . Melatonin 10 MG TABS Take by mouth.    . nitroGLYCERIN (NITROSTAT) 0.4 MG SL  tablet Place 0.4 mg under the tongue every 5 (five) minutes as needed for chest pain.    Marland Kitchen OVER THE COUNTER MEDICATION Anxiety and stress relief 2 tablets at night     No current facility-administered medications for this encounter.    Allergies  Allergen Reactions  . Atorvastatin   . Ciprofibrate Itching  . Ciprofloxacin Itching  . Esomeprazole Magnesium   . Gabapentin   . Lubiprostone Diarrhea  . Statins     Flu like symptoms  . Amlodipine Rash  . Latex Rash and Swelling  . Other Rash and Swelling      Social History   Socioeconomic History  . Marital status: Divorced    Spouse name: Not on file  . Number of children: Not on file  . Years of education: Not on file  . Highest education level: Not on file  Occupational History  . Not on file  Tobacco Use  . Smoking status: Not on file  Substance and Sexual Activity  . Alcohol use: Not on file  . Drug use: Not on file  . Sexual activity: Not on file  Other Topics Concern  . Not on file  Social History Narrative  . Not on file   Social Determinants of Health   Financial Resource Strain:   . Difficulty of Paying Living Expenses:   Food Insecurity:   . Worried About Programme researcher, broadcasting/film/video in the Last Year:   . Barista in the Last Year:   Transportation Needs:   . Freight forwarder (Medical):   Marland Kitchen Lack of Transportation (Non-Medical):   Physical Activity:   . Days of Exercise per Week:   . Minutes of Exercise per Session:   Stress:   . Feeling of Stress :   Social Connections:   . Frequency of Communication with Friends and Family:   . Frequency of Social Gatherings with Friends and Family:   . Attends Religious Services:   . Active Member of Clubs or Organizations:   . Attends Banker Meetings:   Marland Kitchen Marital Status:   Intimate Partner Violence:   . Fear of Current or Ex-Partner:   . Emotionally Abused:   Marland Kitchen Physically Abused:   . Sexually Abused:     FHx:  M died at 19 from MI  and HF D had stroke No brother and sisters  Vitals:   04/01/20 1008  BP: 118/78  Pulse: 75  SpO2: 100%  Weight: 76.8 kg (169 lb 6 oz)    PHYSICAL EXAM: General:  Well appearing. No respiratory difficulty HEENT: normal Neck: supple. no JVD. Carotids 2+ bilat; no bruits. No lymphadenopathy or thryomegaly  appreciated. Cor: PMI nondisplaced. Regular rate & rhythm. No rubs, gallops or murmurs. Lungs: clear Abdomen: soft, nontender, nondistended. No hepatosplenomegaly. No bruits or masses. Good bowel sounds. Extremities: no cyanosis, clubbing, rash, edema Neuro: alert & oriented x 3, cranial nerves grossly intact. moves all 4 extremities w/o difficulty. Affect pleasant.  ECG: NSR 77 LVH anteroseptal Qs with lateral TWI. Narrow QRS. Personally reviewed   ASSESSMENT & PLAN:  1. CAD with progressive exertional angina - I have reviewed her cath films with Dr. Excell Seltzer she has high grade ISR in proximal LAD and a high-grade lesion in [facility] with borderline disease (likely non-obstructive) in the mRCA. Given DM2, ISR and low EF, CABG would typically be best option for her but cMRI shows minimal viability in LAD distribution so would likely not get predicted benefit from LIMA so I suspect PCI is best option. I have reviewed with Dr. Excell Seltzer who agrees - will schedule for PCI of LCX and probable LAD (to protect diagonal which territory may be viable) +/- FFR of RCA for next week - Continue ASA/Plavix  - Post PCI will need to add Eliquis for LV clot and can likely drop ASA - She has been intolerant of all statins. LDL quite high on Zetia. Will need referral to Lipid clinic for PCSK-9 initiation sooner rather than later - Will need to add SGLT2i soon  2. 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 Discussed use of sliding scale lasix - NYHA II-early III - Will need f/u with [place] in HF Clinic for further titration of GDMT  with ARNI, SGLT2i and spiro after PCI  3. LV apical clot - as above. Start Eliquis pos PCI  4. Hyperlipidemia - She has been intolerant of all statins. LDL quite high on Zetia. Will need referral to Lipid clinic for PCSK-9 initiation sooner rather than later  5. DM2 - Glucose elevated on labs - need to check HGbA1c - will need SGLT2i +/- metfrmin soon - f/u with PCP as well  6. Tobacco use - recently quit - encourage sustained cessation  Total time spent 45 minutes. Over half that time spent discussing above.    Arvilla Meres, MD  9:30 PM

## 2020-04-01 NOTE — H&P (View-Only) (Signed)
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  Had anterior MI in 2010 taken emergently to [facility]. Stent was placed emergently. Post stenting she continued to have CP. Went to [facility] that same year and had a a second stent placed. Did well from a cardiac perspective since that time. Reports she has failed all statins due to intolerance. Has been on Zetia. Also has DM2 and says she previously was on insulin but now controls it with diet. Quit smoking 1 month ago,.   In March had NSTEMI. Had cath 02/08/20 at [facility]. 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 Was told she may need repeat stenting vs CABG. Referred here for second opinion.   She reports daily angina with mild to moderate activity which is mildly progressive. No severe angina or resting symptoms. I reviewed cath films with IC team prior to visit and cMRI ordered to assess anterior wall viability.    Has been requiring lasix to manage volume. Currently no orthopnea or PND. NYHA II-early III DOE.   cMRI 03/29/20 1. Subendocardial late gadolinium enhancement consistent with prior infarct in the LAD territory. There is >50% transmural LGE suggesting nonviability in the basal to apical anterior wall and apex. There is <50% transmural LGE suggesting viability in the basal to mid anteroseptum and apical septum  2.  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 Normal LV size with severe systolic dysfunction (EF 02%). Akinesis of basal to apical  anterior/anteroseptal walls and apex  4.  Small RV size with normal systolic function (EF 72%)    Review of Systems: [y] = yes, [ ]  = no   General: Weight gain [ ] ; Weight loss [ ] ; Anorexia [ ] ; Fatigue [ y]; Fever [ ] ; Chills [ ] ; Weakness [ ]   Cardiac: Chest pain/pressure Blue.Reese ]; Resting SOB [ ] ; Exertional SOB Blue.Reese ]; Orthopnea [ ] ; Pedal Edema [ y]; Palpitations [ ] ; Syncope [ ] ; Presyncope [ ] ; Paroxysmal nocturnal dyspnea[ ]   Pulmonary: Cough [ ] ; Wheezing[ ] ; Hemoptysis[ ] ; Sputum [ ] ; Snoring [ ]   GI: Vomiting[ ] ; Dysphagia[ ] ; Melena[ ] ; Hematochezia [ ] ; Heartburn[ ] ; Abdominal pain [ ] ; Constipation [ ] ; Diarrhea [ ] ; BRBPR [ ]   GU: Hematuria[ ] ; Dysuria [ ] ; Nocturia[ ]   Vascular: Pain in legs with walking [ ] ; Pain in feet with lying flat [ ] ; Non-healing sores [ ] ; Stroke [ ] ; TIA [ ] ; Slurred speech [ ] ;  Neuro: Headaches[ ] ; Vertigo[ ] ; Seizures[ ] ; Paresthesias[ ] ;Blurred vision [ ] ; Diplopia [ ] ; Vision changes [ ]   Ortho/Skin: Arthritis Blue.Reese ]; Joint pain Blue.Reese ]; Muscle pain [ ] ; Joint swelling [ ] ; Back Pain [ y]; Rash [ ]   Psych: Depression[ ] ; Anxiety[ ]   Heme: Bleeding problems [ ] ; Clotting disorders [ ] ; Anemia [ ]   Endocrine: Diabetes Blue.Reese ]; Thyroid dysfunction[ ]    No past medical history on file.  Current Outpatient Medications  Medication Sig Dispense Refill  . ALBUTEROL  IN Inhale into the lungs. Prn    . aspirin EC 81 MG tablet Take 81 mg by mouth daily.    . carvedilol (COREG) 3.125 MG tablet Take 3.125 mg by mouth 2 (two) times daily with a meal.    . clopidogrel (PLAVIX) 75 MG tablet Take 75 mg by mouth daily.    Marland Kitchen ezetimibe (ZETIA) 10 MG tablet Take 10 mg by mouth daily.    . famotidine (PEPCID) 20 MG tablet Take 20 mg by mouth 2 (two) times daily.    . furosemide (LASIX) 20 MG tablet Take 40 mg by mouth daily.    Marland Kitchen lisinopril (ZESTRIL) 5 MG tablet Take 5 mg by mouth daily.    . Melatonin 10 MG TABS Take by mouth.    . nitroGLYCERIN (NITROSTAT) 0.4 MG SL  tablet Place 0.4 mg under the tongue every 5 (five) minutes as needed for chest pain.    Marland Kitchen OVER THE COUNTER MEDICATION Anxiety and stress relief 2 tablets at night     No current facility-administered medications for this encounter.    Allergies  Allergen Reactions  . Atorvastatin   . Ciprofibrate Itching  . Ciprofloxacin Itching  . Esomeprazole Magnesium   . Gabapentin   . Lubiprostone Diarrhea  . Statins     Flu like symptoms  . Amlodipine Rash  . Latex Rash and Swelling  . Other Rash and Swelling      Social History   Socioeconomic History  . Marital status: Divorced    Spouse name: Not on file  . Number of children: Not on file  . Years of education: Not on file  . Highest education level: Not on file  Occupational History  . Not on file  Tobacco Use  . Smoking status: Not on file  Substance and Sexual Activity  . Alcohol use: Not on file  . Drug use: Not on file  . Sexual activity: Not on file  Other Topics Concern  . Not on file  Social History Narrative  . Not on file   Social Determinants of Health   Financial Resource Strain:   . Difficulty of Paying Living Expenses:   Food Insecurity:   . Worried About Programme researcher, broadcasting/film/video in the Last Year:   . Barista in the Last Year:   Transportation Needs:   . Freight forwarder (Medical):   Marland Kitchen Lack of Transportation (Non-Medical):   Physical Activity:   . Days of Exercise per Week:   . Minutes of Exercise per Session:   Stress:   . Feeling of Stress :   Social Connections:   . Frequency of Communication with Friends and Family:   . Frequency of Social Gatherings with Friends and Family:   . Attends Religious Services:   . Active Member of Clubs or Organizations:   . Attends Banker Meetings:   Marland Kitchen Marital Status:   Intimate Partner Violence:   . Fear of Current or Ex-Partner:   . Emotionally Abused:   Marland Kitchen Physically Abused:   . Sexually Abused:     FHx:  M died at 19 from MI  and HF D had stroke No brother and sisters  Vitals:   04/01/20 1008  BP: 118/78  Pulse: 75  SpO2: 100%  Weight: 76.8 kg (169 lb 6 oz)    PHYSICAL EXAM: General:  Well appearing. No respiratory difficulty HEENT: normal Neck: supple. no JVD. Carotids 2+ bilat; no bruits. No lymphadenopathy or thryomegaly  appreciated. Cor: PMI nondisplaced. Regular rate & rhythm. No rubs, gallops or murmurs. Lungs: clear Abdomen: soft, nontender, nondistended. No hepatosplenomegaly. No bruits or masses. Good bowel sounds. Extremities: no cyanosis, clubbing, rash, edema Neuro: alert & oriented x 3, cranial nerves grossly intact. moves all 4 extremities w/o difficulty. Affect pleasant.  ECG: NSR 77 LVH anteroseptal Qs with lateral TWI. Narrow QRS. Personally reviewed   ASSESSMENT & PLAN:  1. CAD with progressive exertional angina - I have reviewed her cath films with Dr. Cooper she has high grade ISR in proximal LAD and a high-grade lesion in mLCX with borderline disease (likely non-obstructive) in the mRCA. Given DM2, ISR and low EF, CABG would typically be best option for her but cMRI shows minimal viability in LAD distribution so would likely not get predicted benefit from LIMA so I suspect PCI is best option. I have reviewed with Dr. Cooper who agrees - will schedule for PCI of LCX and probable LAD (to protect diagonal which territory may be viable) +/- FFR of RCA for next week - Continue ASA/Plavix  - Post PCI will need to add Eliquis for LV clot and can likely drop ASA - She has been intolerant of all statins. LDL quite high on Zetia. Will need referral to Lipid clinic for PCSK-9 initiation sooner rather than later - Will need to add SGLT2i soon  2. 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 Discussed use of sliding scale lasix - NYHA II-early III - Will need f/u with us in HF Clinic for further titration of GDMT  with ARNI, SGLT2i and spiro after PCI  3. LV apical clot - as above. Start Eliquis pos PCI  4. Hyperlipidemia - She has been intolerant of all statins. LDL quite high on Zetia. Will need referral to Lipid clinic for PCSK-9 initiation sooner rather than later  5. DM2 - Glucose elevated on labs - need to check HGbA1c - will need SGLT2i +/- metfrmin soon - f/u with PCP as well  6. Tobacco use - recently quit - encourage sustained cessation  Total time spent 45 minutes. Over half that time spent discussing above.    Genesis Paget, MD  9:30 PM    

## 2020-04-01 NOTE — Patient Instructions (Addendum)
Routine lab work today. Will notify you of abnormal results   You have been referred the lipid clinic. (they will contact you to schedule your appointment)  Follow up in 3-4 weeks.

## 2020-04-04 ENCOUNTER — Other Ambulatory Visit (HOSPITAL_COMMUNITY)
Admission: RE | Admit: 2020-04-04 | Discharge: 2020-04-04 | Disposition: A | Discharge status: Home / Self Care | Payer: PPO | Source: Ambulatory Visit | Attending: Cardiovascular Disease | Admitting: Cardiovascular Disease

## 2020-04-04 DIAGNOSIS — Z01812 Encounter for preprocedural laboratory examination: Secondary | ICD-10-CM | POA: Insufficient documentation

## 2020-04-04 DIAGNOSIS — Z20822 Contact with and (suspected) exposure to covid-19: Secondary | ICD-10-CM | POA: Diagnosis not present

## 2020-04-04 LAB — SARS CORONAVIRUS 2 (TAT 6-24 HRS): SARS Coronavirus 2: NEGATIVE

## 2020-04-05 ENCOUNTER — Telehealth (HOSPITAL_COMMUNITY): Payer: Self-pay | Admitting: *Deleted

## 2020-04-05 ENCOUNTER — Telehealth: Payer: Self-pay | Admitting: *Deleted

## 2020-04-05 NOTE — Telephone Encounter (Signed)
Pt contacted pre coronary stent intervention  scheduled at [facility] for: Thursday Apr 07, 2020 1:30 PM Verified arrival time and place: [facility] Main Entrance [facility]) at: 11:30 AM   No solid food after midnight prior to cath, clear liquids until 5 AM day of procedure.  Hold: Lasix-AM of procedure  Except hold medications AM meds can be  taken pre-cath with sip of water including: ASA 81 mg Plavix 75 mg  Confirmed patient has responsible adult to drive home post procedure and observe 24 hours after arriving home: yes  You are allowed ONE visitor in the waiting room during your procedure. Both you and your visitor must wear masks.      COVID-19 Pre-Screening Questions:  . In the past 7 to 10 days have you had a cough,  shortness of breath, headache, congestion, fever (100 or greater) body aches, chills, sore throat, or sudden loss of taste or sense of smell? no . Have you been around anyone with known Covid 19 in the past 7 to 10 days? no . Have you been around anyone who is awaiting Covid 19 test results in the past 7 to 10 days? no . Have you been around anyone who has mentioned symptoms of Covid 19 within the past 7 to 10 days? no  Reviewed procedure/mask/visitor instructions, COVID-19 screening questions with patient.

## 2020-04-05 NOTE — Telephone Encounter (Signed)
Pt called stating she had not heard from the lipid clinic yet to scheduled appt. She was concerned and wanted to know if she was supposed to see them and start medication before he procedure Thursday. I told pt I will reach out to the lipid clinic to see about her appt but this is not something she needs before her procedure. Pt verbalized understanding and thanked me for the call.

## 2020-04-07 ENCOUNTER — Other Ambulatory Visit: Payer: Self-pay

## 2020-04-07 ENCOUNTER — Encounter (HOSPITAL_COMMUNITY)
Admission: RE | Disposition: A | Discharge status: Home / Self Care | Payer: Self-pay | Source: Home / Self Care | Attending: Cardiovascular Disease

## 2020-04-07 ENCOUNTER — Ambulatory Visit (HOSPITAL_COMMUNITY)
Admission: RE | Admit: 2020-04-07 | Discharge: 2020-04-08 | Disposition: A | Discharge status: Home / Self Care | Payer: PPO | Attending: Cardiovascular Disease | Admitting: Cardiovascular Disease

## 2020-04-07 DIAGNOSIS — Z794 Long term (current) use of insulin: Secondary | ICD-10-CM | POA: Diagnosis not present

## 2020-04-07 DIAGNOSIS — Z87891 Personal history of nicotine dependence: Secondary | ICD-10-CM | POA: Diagnosis not present

## 2020-04-07 DIAGNOSIS — E782 Mixed hyperlipidemia: Secondary | ICD-10-CM

## 2020-04-07 DIAGNOSIS — E785 Hyperlipidemia, unspecified: Secondary | ICD-10-CM | POA: Insufficient documentation

## 2020-04-07 DIAGNOSIS — I25110 Atherosclerotic heart disease of native coronary artery with unstable angina pectoris: Secondary | ICD-10-CM

## 2020-04-07 DIAGNOSIS — I255 Ischemic cardiomyopathy: Secondary | ICD-10-CM | POA: Diagnosis not present

## 2020-04-07 DIAGNOSIS — I25119 Atherosclerotic heart disease of native coronary artery with unspecified angina pectoris: Secondary | ICD-10-CM | POA: Insufficient documentation

## 2020-04-07 DIAGNOSIS — M549 Dorsalgia, unspecified: Secondary | ICD-10-CM | POA: Insufficient documentation

## 2020-04-07 DIAGNOSIS — I5022 Chronic systolic (congestive) heart failure: Secondary | ICD-10-CM

## 2020-04-07 DIAGNOSIS — Z8249 Family history of ischemic heart disease and other diseases of the circulatory system: Secondary | ICD-10-CM | POA: Insufficient documentation

## 2020-04-07 DIAGNOSIS — I2510 Atherosclerotic heart disease of native coronary artery without angina pectoris: Secondary | ICD-10-CM | POA: Diagnosis present

## 2020-04-07 DIAGNOSIS — E1165 Type 2 diabetes mellitus with hyperglycemia: Secondary | ICD-10-CM | POA: Diagnosis not present

## 2020-04-07 DIAGNOSIS — Z79899 Other long term (current) drug therapy: Secondary | ICD-10-CM | POA: Diagnosis not present

## 2020-04-07 DIAGNOSIS — Z888 Allergy status to other drugs, medicaments and biological substances status: Secondary | ICD-10-CM | POA: Insufficient documentation

## 2020-04-07 DIAGNOSIS — G8929 Other chronic pain: Secondary | ICD-10-CM | POA: Diagnosis not present

## 2020-04-07 DIAGNOSIS — I513 Intracardiac thrombosis, not elsewhere classified: Secondary | ICD-10-CM | POA: Insufficient documentation

## 2020-04-07 DIAGNOSIS — Z881 Allergy status to other antibiotic agents status: Secondary | ICD-10-CM | POA: Insufficient documentation

## 2020-04-07 DIAGNOSIS — Z7982 Long term (current) use of aspirin: Secondary | ICD-10-CM | POA: Insufficient documentation

## 2020-04-07 DIAGNOSIS — I252 Old myocardial infarction: Secondary | ICD-10-CM | POA: Diagnosis not present

## 2020-04-07 DIAGNOSIS — Z955 Presence of coronary angioplasty implant and graft: Secondary | ICD-10-CM | POA: Insufficient documentation

## 2020-04-07 DIAGNOSIS — I209 Angina pectoris, unspecified: Secondary | ICD-10-CM | POA: Diagnosis present

## 2020-04-07 HISTORY — PX: CORONARY PRESSURE/FFR STUDY: CATH118243

## 2020-04-07 HISTORY — PX: CORONARY ULTRASOUND/IVUS: CATH118244

## 2020-04-07 HISTORY — PX: CORONARY STENT INTERVENTION: CATH118234

## 2020-04-07 HISTORY — PX: LEFT HEART CATH AND CORONARY ANGIOGRAPHY: CATH118249

## 2020-04-07 HISTORY — DX: Type 2 diabetes mellitus without complications: E11.9

## 2020-04-07 HISTORY — DX: Atherosclerotic heart disease of native coronary artery without angina pectoris: I25.10

## 2020-04-07 HISTORY — PX: CORONARY BALLOON ANGIOPLASTY: CATH118233

## 2020-04-07 HISTORY — DX: Essential (primary) hypertension: I10

## 2020-04-07 LAB — PREGNANCY, URINE: Preg Test, Ur: NEGATIVE

## 2020-04-07 LAB — GLUCOSE, CAPILLARY
Glucose-Capillary: 338 mg/dL — ABNORMAL HIGH (ref 70–99)
Glucose-Capillary: 254 mg/dL — ABNORMAL HIGH (ref 70–99)
Glucose-Capillary: 291 mg/dL — ABNORMAL HIGH (ref 70–99)

## 2020-04-07 LAB — POCT ACTIVATED CLOTTING TIME: Activated Clotting Time: 423 seconds

## 2020-04-07 SURGERY — CORONARY STENT INTERVENTION
Anesthesia: LOCAL

## 2020-04-07 MED ORDER — SODIUM CHLORIDE 0.9 % IV SOLN: 250.0000 mL | INTRAVENOUS | Status: DC | PRN

## 2020-04-07 MED ORDER — ONDANSETRON HCL 4 MG/2ML IJ SOLN
INTRAMUSCULAR | Status: AC
  Filled 2020-04-07: qty 2

## 2020-04-07 MED ORDER — LISINOPRIL 5 MG PO TABS
5.0000 mg | ORAL_TABLET | Freq: Every day | ORAL | Status: DC
  Administered 2020-04-08: 5 mg via ORAL
  Filled 2020-04-07: qty 1

## 2020-04-07 MED ORDER — MORPHINE BOLUS VIA INFUSION: 2.0000 mg | INTRAVENOUS | Status: DC | PRN

## 2020-04-07 MED ORDER — NITROGLYCERIN 1 MG/10 ML FOR IR/CATH LAB
INTRA_ARTERIAL | Status: DC | PRN
  Administered 2020-04-07: 100 ug via INTRACORONARY
  Administered 2020-04-07: 200 ug via INTRACORONARY
  Administered 2020-04-07: 100 ug via INTRACORONARY

## 2020-04-07 MED ORDER — MELATONIN 5 MG PO TABS
30.0000 mg | ORAL_TABLET | Freq: Every day | ORAL | Status: DC
  Administered 2020-04-08: 30 mg via ORAL
  Filled 2020-04-07: qty 6

## 2020-04-07 MED ORDER — CARVEDILOL 3.125 MG PO TABS
3.1250 mg | ORAL_TABLET | Freq: Two times a day (BID) | ORAL | Status: DC
  Administered 2020-04-08: 3.125 mg via ORAL
  Filled 2020-04-07: qty 1

## 2020-04-07 MED ORDER — MORPHINE SULFATE (PF) 2 MG/ML IV SOLN
1.0000 mg | INTRAVENOUS | Status: DC | PRN
  Administered 2020-04-07 – 2020-04-08 (×2): 1 mg via INTRAVENOUS
  Filled 2020-04-07 (×2): qty 1

## 2020-04-07 MED ORDER — HYDRALAZINE HCL 20 MG/ML IJ SOLN: 10.0000 mg | INTRAMUSCULAR | Status: AC | PRN

## 2020-04-07 MED ORDER — ASPIRIN 81 MG PO CHEW: 81.0000 mg | CHEWABLE_TABLET | ORAL | Status: DC

## 2020-04-07 MED ORDER — SODIUM CHLORIDE 0.9 % IV SOLN: INTRAVENOUS | Status: DC

## 2020-04-07 MED ORDER — HEPARIN (PORCINE) IN NACL 1000-0.9 UT/500ML-% IV SOLN
INTRAVENOUS | Status: DC | PRN
  Administered 2020-04-07 (×2): 500 mL

## 2020-04-07 MED ORDER — MIDAZOLAM HCL 2 MG/2ML IJ SOLN
INTRAMUSCULAR | Status: AC
  Filled 2020-04-07: qty 2

## 2020-04-07 MED ORDER — SODIUM CHLORIDE 0.9% FLUSH
3.0000 mL | Freq: Two times a day (BID) | INTRAVENOUS | Status: DC
  Administered 2020-04-08 (×2): 3 mL via INTRAVENOUS

## 2020-04-07 MED ORDER — ASPIRIN EC 81 MG PO TBEC
81.0000 mg | DELAYED_RELEASE_TABLET | Freq: Every day | ORAL | Status: DC
  Administered 2020-04-08: 81 mg via ORAL
  Filled 2020-04-07: qty 1

## 2020-04-07 MED ORDER — LIDOCAINE HCL (PF) 1 % IJ SOLN
INTRAMUSCULAR | Status: DC | PRN
  Administered 2020-04-07: 15 mL

## 2020-04-07 MED ORDER — SODIUM CHLORIDE 0.9 % IV SOLN
INTRAVENOUS | Status: AC | PRN
  Administered 2020-04-07: 500 mL via INTRAVENOUS

## 2020-04-07 MED ORDER — CLOPIDOGREL BISULFATE 75 MG PO TABS
75.0000 mg | ORAL_TABLET | Freq: Every day | ORAL | Status: DC
  Administered 2020-04-08: 75 mg via ORAL
  Filled 2020-04-07: qty 1

## 2020-04-07 MED ORDER — SODIUM CHLORIDE 0.9 % WEIGHT BASED INFUSION
1.0000 mL/kg/h | INTRAVENOUS | Status: AC
  Administered 2020-04-07: 1 mL/kg/h via INTRAVENOUS

## 2020-04-07 MED ORDER — NITROGLYCERIN 0.4 MG SL SUBL: 0.4000 mg | SUBLINGUAL_TABLET | SUBLINGUAL | Status: DC | PRN

## 2020-04-07 MED ORDER — FENTANYL CITRATE (PF) 100 MCG/2ML IJ SOLN
INTRAMUSCULAR | Status: DC | PRN
  Administered 2020-04-07: 50 ug via INTRAVENOUS
  Administered 2020-04-07 (×3): 25 ug via INTRAVENOUS

## 2020-04-07 MED ORDER — LIDOCAINE HCL (PF) 1 % IJ SOLN
INTRAMUSCULAR | Status: AC
  Filled 2020-04-07: qty 30

## 2020-04-07 MED ORDER — BIVALIRUDIN TRIFLUOROACETATE 250 MG IV SOLR
INTRAVENOUS | Status: AC
  Filled 2020-04-07: qty 250

## 2020-04-07 MED ORDER — ACETAMINOPHEN 325 MG PO TABS
650.0000 mg | ORAL_TABLET | ORAL | Status: DC | PRN
  Administered 2020-04-07: 650 mg via ORAL
  Filled 2020-04-07: qty 2

## 2020-04-07 MED ORDER — EZETIMIBE 10 MG PO TABS
10.0000 mg | ORAL_TABLET | Freq: Every day | ORAL | Status: DC
  Administered 2020-04-08: 10 mg via ORAL
  Filled 2020-04-07: qty 1

## 2020-04-07 MED ORDER — LABETALOL HCL 5 MG/ML IV SOLN: 10.0000 mg | INTRAVENOUS | Status: AC | PRN

## 2020-04-07 MED ORDER — SODIUM CHLORIDE 0.9% FLUSH
3.0000 mL | Freq: Two times a day (BID) | INTRAVENOUS | Status: DC
  Administered 2020-04-07 – 2020-04-08 (×2): 3 mL via INTRAVENOUS

## 2020-04-07 MED ORDER — INSULIN ASPART 100 UNIT/ML ~~LOC~~ SOLN
0.0000 [IU] | Freq: Three times a day (TID) | SUBCUTANEOUS | Status: DC
  Administered 2020-04-08 (×2): 5 [IU] via SUBCUTANEOUS

## 2020-04-07 MED ORDER — MORPHINE SULFATE (PF) 2 MG/ML IV SOLN: 2.0000 mg | INTRAVENOUS | Status: DC | PRN

## 2020-04-07 MED ORDER — ALBUTEROL SULFATE (2.5 MG/3ML) 0.083% IN NEBU: 2.5000 mg | INHALATION_SOLUTION | RESPIRATORY_TRACT | Status: DC | PRN

## 2020-04-07 MED ORDER — SODIUM CHLORIDE 0.9 % IV SOLN
INTRAVENOUS | Status: DC | PRN
  Administered 2020-04-07 (×2): 1.75 mg/kg/h via INTRAVENOUS

## 2020-04-07 MED ORDER — FENTANYL CITRATE (PF) 100 MCG/2ML IJ SOLN
INTRAMUSCULAR | Status: AC
  Filled 2020-04-07: qty 2

## 2020-04-07 MED ORDER — MIDAZOLAM HCL 2 MG/2ML IJ SOLN
INTRAMUSCULAR | Status: DC | PRN
  Administered 2020-04-07 (×4): 1 mg via INTRAVENOUS

## 2020-04-07 MED ORDER — NITROGLYCERIN 0.4 MG SL SUBL
SUBLINGUAL_TABLET | SUBLINGUAL | Status: AC
  Administered 2020-04-07: 0.4 mg via SUBLINGUAL
  Filled 2020-04-07: qty 1

## 2020-04-07 MED ORDER — ALBUTEROL SULFATE HFA 108 (90 BASE) MCG/ACT IN AERS: 2.0000 | INHALATION_SPRAY | RESPIRATORY_TRACT | Status: DC | PRN

## 2020-04-07 MED ORDER — ONDANSETRON HCL 4 MG/2ML IJ SOLN
INTRAMUSCULAR | Status: DC | PRN
  Administered 2020-04-07: 4 mg via INTRAVENOUS

## 2020-04-07 MED ORDER — IOHEXOL 350 MG/ML SOLN
INTRAVENOUS | Status: DC | PRN
  Administered 2020-04-07: 145 mL

## 2020-04-07 MED ORDER — HEPARIN (PORCINE) IN NACL 1000-0.9 UT/500ML-% IV SOLN
INTRAVENOUS | Status: AC
  Filled 2020-04-07: qty 1000

## 2020-04-07 MED ORDER — ONDANSETRON HCL 4 MG/2ML IJ SOLN: 4.0000 mg | Freq: Four times a day (QID) | INTRAMUSCULAR | Status: DC | PRN

## 2020-04-07 MED ORDER — NITROGLYCERIN 1 MG/10 ML FOR IR/CATH LAB
INTRA_ARTERIAL | Status: AC
  Filled 2020-04-07: qty 10

## 2020-04-07 MED ORDER — BIVALIRUDIN BOLUS VIA INFUSION - CUPID
INTRAVENOUS | Status: DC | PRN
  Administered 2020-04-07: 57.525 mg via INTRAVENOUS

## 2020-04-07 MED ORDER — CLOPIDOGREL BISULFATE 75 MG PO TABS: 75.0000 mg | ORAL_TABLET | ORAL | Status: DC

## 2020-04-07 MED ORDER — FUROSEMIDE 40 MG PO TABS
40.0000 mg | ORAL_TABLET | Freq: Every day | ORAL | Status: DC
  Administered 2020-04-08: 40 mg via ORAL
  Filled 2020-04-07: qty 1

## 2020-04-07 MED ORDER — SODIUM CHLORIDE 0.9% FLUSH: 3.0000 mL | INTRAVENOUS | Status: DC | PRN

## 2020-04-07 MED ORDER — FAMOTIDINE 20 MG PO TABS
20.0000 mg | ORAL_TABLET | Freq: Two times a day (BID) | ORAL | Status: DC
  Administered 2020-04-07 – 2020-04-08 (×2): 20 mg via ORAL
  Filled 2020-04-07 (×2): qty 1

## 2020-04-07 SURGICAL SUPPLY — 28 items
BALLN SAPPHIRE 2.0X12 (BALLOONS) ×2
BALLN SAPPHIRE 2.5X15 (BALLOONS) ×2
BALLN SAPPHIRE ~~LOC~~ 3.0X15 (BALLOONS) ×4 IMPLANT
BALLN WOLVERINE 3.00X15 (BALLOONS) ×2
BALLOON SAPPHIRE 2.0X12 (BALLOONS) ×1 IMPLANT
BALLOON SAPPHIRE 2.5X15 (BALLOONS) ×1 IMPLANT
BALLOON WOLVERINE 3.00X15 (BALLOONS) ×1 IMPLANT
CATH LAUNCHER 6FR EBU 3 (CATHETERS) ×2 IMPLANT
CATH LAUNCHER 6FR JR4 (CATHETERS) ×2 IMPLANT
CATH OPTICROSS 40MHZ (CATHETERS) ×2 IMPLANT
DEVICE CLOSURE PERCLS PRGLD 6F (VASCULAR PRODUCTS) ×1 IMPLANT
GUIDEWIRE PRESSURE X 175 (WIRE) ×2 IMPLANT
KIT ENCORE 26 ADVANTAGE (KITS) ×2 IMPLANT
KIT HEART LEFT (KITS) ×2 IMPLANT
KIT MICROPUNCTURE NIT STIFF (SHEATH) ×2 IMPLANT
PACK CARDIAC CATHETERIZATION (CUSTOM PROCEDURE TRAY) ×2 IMPLANT
PERCLOSE PROGLIDE 6F (VASCULAR PRODUCTS) ×2
SHEATH PINNACLE 6F 10CM (SHEATH) ×2 IMPLANT
SHEATH PROBE COVER 6X72 (BAG) ×2 IMPLANT
SLED PULL BACK IVUS (MISCELLANEOUS) ×2 IMPLANT
STENT SYNERGY XD 2.50X38 (Permanent Stent) ×1 IMPLANT
STENT SYNERGY XD 2.75X28 (Permanent Stent) ×1 IMPLANT
SYNERGY XD 2.50X38 (Permanent Stent) ×2 IMPLANT
SYNERGY XD 2.75X28 (Permanent Stent) ×2 IMPLANT
TRANSDUCER W/STOPCOCK (MISCELLANEOUS) ×2 IMPLANT
TUBING CIL FLEX 10 FLL-RA (TUBING) ×2 IMPLANT
WIRE COUGAR XT STRL 190CM (WIRE) ×4 IMPLANT
WIRE EMERALD 3MM-J .035X150CM (WIRE) ×2 IMPLANT

## 2020-04-07 NOTE — Interval H&P Note (Signed)
Cath Lab Visit (complete for each Cath Lab visit)  Clinical Evaluation Leading to the Procedure:   ACS: No.  Non-ACS:    Anginal Classification: CCS III  Anti-ischemic medical therapy: Minimal Therapy (1 class of medications)  Non-Invasive Test Results: No non-invasive testing performed  Prior CABG: No previous CABG      History and Physical Interval Note:  04/07/2020 1:26 PM  Julie Hernandez  has presented today for surgery, with the diagnosis of cad.  The various methods of treatment have been discussed with the patient and family. After consideration of risks, benefits and other options for treatment, the patient has consented to  Procedure(s): CORONARY STENT INTERVENTION (N/A) as a surgical intervention.  The patient's history has been reviewed, patient examined, no change in status, stable for surgery.  I have reviewed the patient's chart and labs.  Questions were answered to the patient's satisfaction.     Tonny Bollman

## 2020-04-08 DIAGNOSIS — E785 Hyperlipidemia, unspecified: Secondary | ICD-10-CM | POA: Diagnosis not present

## 2020-04-08 DIAGNOSIS — M549 Dorsalgia, unspecified: Secondary | ICD-10-CM | POA: Diagnosis not present

## 2020-04-08 DIAGNOSIS — Z87891 Personal history of nicotine dependence: Secondary | ICD-10-CM | POA: Diagnosis not present

## 2020-04-08 DIAGNOSIS — Z881 Allergy status to other antibiotic agents status: Secondary | ICD-10-CM | POA: Diagnosis not present

## 2020-04-08 DIAGNOSIS — I5022 Chronic systolic (congestive) heart failure: Secondary | ICD-10-CM | POA: Diagnosis not present

## 2020-04-08 DIAGNOSIS — I513 Intracardiac thrombosis, not elsewhere classified: Secondary | ICD-10-CM | POA: Diagnosis not present

## 2020-04-08 DIAGNOSIS — I255 Ischemic cardiomyopathy: Secondary | ICD-10-CM | POA: Diagnosis not present

## 2020-04-08 DIAGNOSIS — I25119 Atherosclerotic heart disease of native coronary artery with unspecified angina pectoris: Secondary | ICD-10-CM | POA: Diagnosis not present

## 2020-04-08 DIAGNOSIS — G8929 Other chronic pain: Secondary | ICD-10-CM | POA: Diagnosis not present

## 2020-04-08 DIAGNOSIS — I25118 Atherosclerotic heart disease of native coronary artery with other forms of angina pectoris: Secondary | ICD-10-CM | POA: Diagnosis not present

## 2020-04-08 DIAGNOSIS — I252 Old myocardial infarction: Secondary | ICD-10-CM | POA: Diagnosis not present

## 2020-04-08 DIAGNOSIS — E1165 Type 2 diabetes mellitus with hyperglycemia: Secondary | ICD-10-CM | POA: Diagnosis not present

## 2020-04-08 DIAGNOSIS — Z888 Allergy status to other drugs, medicaments and biological substances status: Secondary | ICD-10-CM | POA: Diagnosis not present

## 2020-04-08 LAB — CBC
HCT: 41.5 % (ref 36.0–46.0)
Hemoglobin: 13 g/dL (ref 12.0–15.0)
MCH: 28.1 pg (ref 26.0–34.0)
MCHC: 31.3 g/dL (ref 30.0–36.0)
MCV: 89.8 fL (ref 80.0–100.0)
Platelets: 242 10*3/uL (ref 150–400)
RBC: 4.62 MIL/uL (ref 3.87–5.11)
RDW: 13.4 % (ref 11.5–15.5)
WBC: 7.9 10*3/uL (ref 4.0–10.5)
nRBC: 0 % (ref 0.0–0.2)

## 2020-04-08 LAB — BASIC METABOLIC PANEL
Anion gap: 7 (ref 5–15)
BUN: 19 mg/dL (ref 6–20)
CO2: 24 mmol/L (ref 22–32)
Calcium: 8.7 mg/dL — ABNORMAL LOW (ref 8.9–10.3)
Chloride: 107 mmol/L (ref 98–111)
Creatinine, Ser: 1.04 mg/dL — ABNORMAL HIGH (ref 0.44–1.00)
GFR calc Af Amer: >60 mL/min (ref >60)
GFR calc non Af Amer: >60 mL/min (ref >60)
Glucose, Bld: 193 mg/dL — ABNORMAL HIGH (ref 70–99)
Potassium: 4.2 mmol/L (ref 3.5–5.1)
Sodium: 138 mmol/L (ref 135–145)

## 2020-04-08 LAB — GLUCOSE, CAPILLARY
Glucose-Capillary: 201 mg/dL — ABNORMAL HIGH (ref 70–99)
Glucose-Capillary: 233 mg/dL — ABNORMAL HIGH (ref 70–99)

## 2020-04-08 LAB — HEMOGLOBIN A1C
Hgb A1c MFr Bld: 11 % — ABNORMAL HIGH (ref 4.8–5.6)
Mean Plasma Glucose: 269 mg/dL

## 2020-04-08 MED ORDER — ANGIOPLASTY BOOK
Freq: Once | Status: AC
  Filled 2020-04-08: qty 1

## 2020-04-08 MED ORDER — "PEN NEEDLES 3/16"" 31G X 5 MM MISC": 1.0000 | 0 refills | Status: AC

## 2020-04-08 MED ORDER — BLOOD GLUCOSE METER KIT: PACK | 1 refills | Status: AC

## 2020-04-08 MED ORDER — INSULIN GLARGINE 100 UNIT/ML SOLOSTAR PEN: 8.0000 [IU] | PEN_INJECTOR | Freq: Every day | SUBCUTANEOUS | 0 refills | Status: DC

## 2020-04-08 MED ORDER — JARDIANCE 10 MG PO TABS
10.0000 mg | ORAL_TABLET | Freq: Every day | ORAL | 11 refills | Status: DC
Start: 2020-04-08 — End: 2020-12-02

## 2020-04-08 MED ORDER — FUROSEMIDE 20 MG PO TABS: 40.0000 mg | ORAL_TABLET | Freq: Every day | ORAL | 11 refills | Status: DC

## 2020-04-08 MED ORDER — THE SENSUOUS HEART BOOK
Freq: Once | Status: AC
  Filled 2020-04-08: qty 1

## 2020-04-08 MED FILL — FUROSEMIDE 20 MG TAB: 20 | 30 days supply | Qty: 60 | Fill #0

## 2020-04-08 MED FILL — JARDIANCE 10 MG TABLET: 10 | 30 days supply | Qty: 30 | Fill #0

## 2020-04-08 NOTE — Progress Notes (Signed)
CARDIAC REHAB PHASE I   PRE:  Rate/Rhythm: 85 NSR  BP:  Sitting: 100/68  Standing:       SaO2: 98 RA  MODE:  Ambulation: 400 ft   POST:  Rate/Rhythm: 107 ST  BP:  Sitting: 96/66  Standing:       SaO2: 97 RA  0925-1107 Pt received in bed, agrees to ambulate. Pt ambulated with gaured but steady gait due to right groin incision from cath. Pt had  no c/o SOB, or dizziness. Pt did c/o of upper back pain at end of walk. Pt has chronic back issues. Pt placed back to chair and pain resolved. Call bell at side. Then provided education to patient patient regarding her stents. Pt voices having stent card. Encouraged to carry it/make copy. Stressed to pt to take ASA/Plavix and all meds per MD instructions. Pt provided education on MI which occurred 01/2020. Due to low EF, discussed S/S of CHF and reviewed daily weights, low NA diet. Dicussed heart healthy diet/diabetes with pat. Due to elevated HGBA1c pt encouraged to see her primary MD. Discussed walking at home and given activity guidelines. Reviewed use of NTG.   Reviewed activity restrictions S/P cath as well as S/S to report to MD. Pt verbalized understanding. Pt was referred to CRP2 in [place].   Lorin Picket, MS, ACSM EP-C, [facility] 04/08/2020  10:54 AM

## 2020-04-08 NOTE — Progress Notes (Signed)
Spoke with patient about her diabetes. She was diagnosed with gestational diabetes in 1992 and was on insulin 3-4 times per day. She was diagnosed with Type 2 diabetes in 2012 and was on Lantus and Novolog. Then the last 2 years, she has been off insulin completely and following a diet and exercising to keep her A1C down. The heart attack in March, 2021 has caused her to have more problems with control, thus accounting for her current HgbA1C of 11%. Stated that she has been craving carbohydrates and not able to exercise in the last 2 months.  At discharge, recommend SGLT 2 (Jardiance) and may want to consider low dose of Lantus 10 units daily. She will need a prescription for home blood glucose meter, strips,and lancets and will need to check blood sugars at least twice a day. Will need to follow up with her PCP for glucose control.   For discharge: Lantus Solostar insulin pen  (Order # [phone]) Insulin pen needles  31 gauge X 5 mm (order #[id number]) CBG meter and supplies (order #[id number])  Smith Mince RN BSN CDE Diabetes Coordinator Pager: [phone]  8am-5pm

## 2020-04-08 NOTE — Discharge Summary (Addendum)
The patient has been seen in conjunction with Vin Bhagat, PAC. All aspects of care have been considered and discussed. The patient has been personally interviewed, examined, and all clinical data has been reviewed.  Education and awareness concerning secondary risk prevention was discussed in detail: LDL less than 70, hemoglobin A1c less than 7 and benefit of SGLT-2 I, blood pressure target less than 130/80 mmHg, >150 minutes of moderate aerobic activity per week, avoidance of smoking, weight control (via diet and exercise), and continued surveillance/management of healthy sleep.  May need to rule out sleep apnea if appropriate. LVEF 26% with apical thrombus (likely old) by MRI.  The plan is to start chronic anticoagulation therapy to prevent embolic stroke, after 1 month of aspirin and Plavix.  Once Eliquis started, aspirin will be dropped.  Total duration of Plavix should be at least 6 months but preferably longer due to restenting of the LAD over a long segment. Guideline directed therapy for systolic heart failure include lisinopril, carvedilol, and furosemide.  Perhaps as an outpatient furosemide can be substituted out for a mineralocorticoid receptor antagonist. Elevated hemoglobin A1c has led to the addition of SGL T2 inhibition therapy and she will likely need to have Metformin or other agent started as an outpatient Right femoral cath site is tender but no hematoma and excellent pulses. Discharge this morning after cardiac rehab.  I have recommended that she have outpatient rehab. Follow-up with Dr. Haroldine Laws. In addition to SGLT2 therapy, Lantus 8 units subcu every morning is added to improve diabetes control.     Discharge Summary    Patient ID: Julie Hernandez MRN: [id number]; DOB: Aug 09, 1971  Admit date: 04/07/2020 Discharge date: 04/08/2020  Primary Care Provider: Truett Mainland, FNP  Primary Cardiologist: Glori Bickers, MD  Discharge Diagnoses    Active Problems:  Angina pectoris [facility])   CAD (coronary artery disease)   Chronic systolic heart failure (HCC)   LV thrombus   HLD with statin intolerance   Uncontrolled diabetes mellitus  CAD  Prior smoker  Diagnostic Studies/Procedures     CORONARY BALLOON ANGIOPLASTY 04/07/20  CORONARY STENT INTERVENTION  INTRAVASCULAR PRESSURE WIRE/FFR STUDY  Intravascular Ultrasound/IVUS  LEFT HEART CATH AND CORONARY ANGIOGRAPHY  Conclusion    Prox Cx to Dist Cx lesion is 95% stenosed. Prox RCA lesion is 50% stenosed with 50% stenosed side branch in RV Branch. A drug-eluting stent was successfully placed using a SYNERGY XD 2.75X28. Post intervention, there is a 0% residual stenosis. Prox LAD to Mid LAD lesion is 95% stenosed. Post intervention, there is a 0% residual stenosis. A drug-eluting stent was successfully placed using a SYNERGY XD 2.50X38. 1st Diag lesion is 90% stenosed. Balloon angioplasty was performed using a BALLOON SAPPHIRE 2.0X12. Post intervention, there is a 80% residual stenosis. LV end diastolic pressure is normal.   1.  Severe mid circumflex stenosis, treated successfully with a 2.75 x 28 mm Synergy DES 2.  Severe diffuse proximal LAD in-stent restenosis, treated successfully with a 2.5 x 38 mm Synergy DES using intravascular ultrasound guidance 3.  Moderate proximal RCA stenosis, demonstrated to be nonobstructive by RFR analysis 4.  Normal LVEDP   Recommendations: Overnight observation with plans for discharge tomorrow morning.  The patient was found to have LV apical thrombus by cardiac MRI.  This is likely chronic as her anterior MI was many years ago.  Recommend 1 month of aspirin and clopidogrel, then stop aspirin and start apixaban.  Continue clopidogrel indefinitely as tolerated.    Diagnostic  Dominance: Right  Intervention      History of Present Illness     Julie Hernandez is a 49 y.o. female with s/p anterior MI, CAD, ischemic cardiomyopathy, chronic systolic heart  failure, diet-controlled diabetes mellitus and chronic back pain presented for outpatient cardiac catheterization.  History of anterior MI in 2010 at [facility].  She had emergent stent placement but continued to have chest pain.  Went to [facility] same ER requiring second stent placement.  Statin intolerance.  Prior smoker which she quit April 2021.  Patient had non-STEMI 02/08/2020 at [facility].Cath showed high grade (99%) ISR of proximal LAD stent, 95% lesion in mid LCX and 50-60% lesion in mRCA. 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 Was told she may need repeat stenting vs CABG.   Establish care with Dr. Haroldine Laws as 2nd opinion.  Daily angina with mild to moderate activity.  Cardiac MRI showed LV apical thrombus measuring 9 mm across 7 mm and minimal viability in LAD distribution.  Patient was scheduled for cardiac catheterization.  Hospital Course     Consultants: None  Cardiac cath showed severely diffuse proximal LAD in-stent stenosis s/p PCI with DES with uses of intravascular ultrasound.  She also has severe mid circumflex stenosis which treated with DES.  She had a moderate proximal RCA stenosis which was nonobstructive by RFR analysis.. Observe overnight without any complication.  Ambulated well.  Recommended 1 month of dual antiplatelet therapy with aspirin and Plavix, then stop aspirin and start apixaban as her LV thrombus is chronic.  Continue Plavix in definitively.  She ambulated well.  Plan to follow-up with Dr. Haroldine Laws as outpatient and up titration of heart failure medical regimen.  History of statin intolerance>> referred to lipid clinic.  Patient carries diagnosis of diet-controlled diabetes mellitus however hemoglobin A1c come back as 11. Started on Jardiance and Lantus.    Did the patient have an acute coronary syndrome (MI, NSTEMI, STEMI, etc) this admission?:  Yes                               AHA/ACC  Clinical Performance & Quality Measures: Aspirin prescribed? - Yes ADP Receptor Inhibitor (Plavix/Clopidogrel, Brilinta/Ticagrelor or Effient/Prasugrel) prescribed (includes medically managed patients)? - Yes Beta Blocker prescribed? - Yes High Intensity Statin (Lipitor 40-'80mg'$  or Crestor 20-'40mg'$ ) prescribed? - No - Statin intolerance EF assessed during THIS hospitalization? - Yes For EF <40%, was ACEI/ARB prescribed? - YES For EF <40%, Aldosterone Antagonist (Spironolactone or Eplerenone) prescribed? - No - Reason:  Plan to start as outpatient Cardiac Rehab Phase II ordered (including medically managed patients)? - Yes   _____________  Discharge Vitals Blood pressure (!) 84/53, pulse 74, temperature (!) 97.3 F (36.3 C), temperature source Oral, resp. rate 20, height '5\' 2"'$  (1.575 m), weight 77.1 kg, SpO2 94 %.  Filed Weights   04/07/20 1144 04/08/20 0436  Weight: 76.7 kg 77.1 kg    Labs & Radiologic Studies    CBC Recent Labs    04/08/20 0330  WBC 7.9  HGB 13.0  HCT 41.5  MCV 89.8  PLT 530   Basic Metabolic Panel Recent Labs    04/08/20 0330  NA 138  K 4.2  CL 107  CO2 24  GLUCOSE 193*  BUN 19  CREATININE 1.04*  CALCIUM 8.7*   Hemoglobin A1C Recent Labs    04/08/20 0330  HGBA1C 11.0*  _____________  CARDIAC CATHETERIZATION  Result Date: 04/07/2020  Prox Cx to Dist Cx lesion is 95% stenosed.  Prox RCA lesion is 50% stenosed with 50% stenosed side branch in RV Branch.  A drug-eluting stent was successfully placed using a SYNERGY XD 2.75X28.  Post intervention, there is a 0% residual stenosis.  Prox LAD to Mid LAD lesion is 95% stenosed.  Post intervention, there is a 0% residual stenosis.  A drug-eluting stent was successfully placed using a SYNERGY XD 2.50X38.  1st Diag lesion is 90% stenosed.  Balloon angioplasty was performed using a BALLOON SAPPHIRE 2.0X12.  Post intervention, there is a 80% residual stenosis.  LV end diastolic pressure is normal.   1.  Severe mid circumflex stenosis, treated successfully with a 2.75 x 28 mm Synergy DES 2.  Severe diffuse proximal LAD in-stent restenosis, treated successfully with a 2.5 x 38 mm Synergy DES using intravascular ultrasound guidance 3.  Moderate proximal RCA stenosis, demonstrated to be nonobstructive by RFR analysis 4.  Normal LVEDP Recommendations: Overnight observation with plans for discharge tomorrow morning.  The patient was found to have LV apical thrombus by cardiac MRI.  This is likely chronic as her anterior MI was many years ago.  Recommend 1 month of aspirin and clopidogrel, then stop aspirin and start apixaban.  Continue clopidogrel indefinitely as tolerated.   MR CARDIAC MORPHOLOGY W WO CONTRAST  Result Date: 03/29/2020 CLINICAL DATA:  Evaluate for viability EXAM: CARDIAC MRI TECHNIQUE: The patient was scanned on a 1.5 Tesla Siemens magnet. A dedicated cardiac coil was used. Functional imaging was done using Fiesta sequences. 2,3, and 4 chamber views were done to assess for RWMA's. Modified Simpson's rule using a short axis stack was used to calculate an ejection fraction on a dedicated work Conservation officer, nature. The patient received 10 cc of Gadavist. After 10 minutes inversion recovery sequences were used to assess for infiltration and scar tissue. CONTRAST:  10 cc  of Gadavist FINDINGS: Left ventricle: -Normal size -Severe systolic dysfunction. Basal to apical anterior/anteroseptal and apex akinesis -ECV elevated (34%) -Apical thrombus measuring 57m x 762m-Subendocardial LGE in following distribution: Basal anterior: 50-75% transmural Basal anteroseptal: 25-50% Mid anterior: 50-75% Mid anteroseptal: 25-50% Apical anterior: 50-75% Apical septal: 25-50% Apex: 75-100% LV EF:  26% (Normal 56-78%) Absolute volumes: LV EDV: 15842mNormal 52-141 mL) LV ESV: 118m35mormal 13-51 mL) LV SV: 41mL72mrmal 33-97 mL) CO: 2.7L/min (Normal 2.7-6.0 L/min) Indexed volumes: LV EDV: 79mL/49m (Normal  41-81 mL/sq-m) LV ESV: 59mL/s38m(Normal 12-21 mL/sq-m) LV SV: 20mL/sq52mNormal 26-56 mL/sq-m) CI: 1.3L/min/sq-m (Normal 1.8-3.8 L/min/sq-m) Right ventricle: Small size.  Normal systolic function. RV EF: 62% (Normal 47-80%) Absolute volumes: RV EDV: 63mL (No70m 58-154 mL) RV ESV: 24mL (Nor40m12-68 mL) RV SV: 39mL (Norm24m5-98 mL) CO: 2.6L/min (Normal 2.7-6 L/min) Indexed volumes: RV EDV: 31mL/sq-m (74mal 48-87 mL/sq-m) RV ESV: 12mL/sq-m (N84ml 11-28 mL/sq-m) RV SV: 20mL/sq-m (No63m 27-57 mL/sq-m) CI: 1.3L/min/sq-m (Normal 1.8-3.8 L/min/sq-m) Left atrium: Mild enlargement Right atrium: Normal size Mitral valve: No regurgitation Aortic valve: No regurgitation Tricuspid valve: No regurgitation Pulmonic valve: No regurgitation Pericardium: Small effusion IMPRESSION: 1. Subendocardial late gadolinium enhancement consistent with prior infarct in the LAD territory. There is >50% transmural LGE suggesting nonviability in the basal to apical anterior wall and apex. There is <50% transmural LGE suggesting viability in the basal to mid anteroseptum and apical septum 2.  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 N41mal L20mize with severe systolic dysfunction (EF 26%).35%  Akinesis of basal to apical anterior/anteroseptal walls and apex 4.  Small RV size with normal systolic function (EF 44%) Electronically Signed   By: Oswaldo Milian MD   On: 03/29/2020 21:26   Disposition   Pt is being discharged home today in good condition.  Follow-up Plans & Appointments    Follow-up Information     Bensimhon, Shaune Pascal, MD. Go on 05/05/2020.   Specialty: Cardiology Why: '@10'$ :20am for cath follow up  Contact information: [place] [address] [phone]           Discharge Instructions     AMB Referral to Advanced Lipid Disorders Clinic   Complete by: As directed    P CV DIV Weaver [[id number]]  Please refer to pharmacist Lipid clinc   Reason for referral:  PCSK9 inhibitor education and prior authorization approvals   Internal Lipid Clinic Referral Scheduling  Internal lipid clinic referrals are providers within [facility], who wish to refer established patients for routine management (help in starting PCSK9 inhibitor therapy) or advanced therapies.  Internal MD referral criteria:              1. All patients with LDL>190 mg/dL  2. All patients with Triglycerides >500 mg/dL  3. Patients with suspected or confirmed heterozygous familial hyperlipidemia (HeFH) or homozygous familial hyperlipidemia (HoFH)  4. Patients with family history of suspicious for genetic dyslipidemia desiring genetic testing  5. Patients refractory to standard guideline based therapy  6. Patients with statin intolerance (failed 2 statins, one of which must be a high potency statin)  7. Patients who the provider desires to be seen by MD   Internal [name] referral criteria:   1. Follow-up patients for medication management  2. Follow-up for compliance monitoring  3. Patients for drug education  4. Patients with statin intolerance  5. PCSK9 inhibitor education and prior authorization approvals  6. Patients with triglycerides <500 mg/dL  External Lipid Clinic Referral  External lipid clinic referrals are for providers outside of [facility], considered new clinic patients - automatically routed to MD schedule   Amb Referral to Cardiac Rehabilitation   Complete by: As directed    To [place]   Diagnosis: Coronary Stents   After initial evaluation and assessments completed: Virtual Based Care may be provided alone or in conjunction with Phase 2 Cardiac Rehab based on patient barriers.: Yes   Diet - low sodium heart healthy   Complete by: As directed    Diet - low sodium heart healthy   Complete by: As directed    Discharge instructions   Complete by: As directed    No driving for 48 hours. No lifting over 5 lbs for 1 week. No sexual activity for 1 week.  Keep  procedure site clean & dry. If you notice increased pain, swelling, bleeding or pus, call/return!  You may shower, but no soaking baths/hot tubs/pools for 1 week.   Discharge instructions   Complete by: As directed    No driving for 48 hours. No lifting over 5 lbs for 1 week. No sexual activity for 1 week.  Keep procedure site clean & dry. If you notice increased pain, swelling, bleeding or pus, call/return!  You may shower, but no soaking baths/hot tubs/pools for 1 week.   Increase activity slowly   Complete by: As directed    Increase activity slowly   Complete by: As directed        Discharge Medications   Allergies  as of 04/08/2020       Reactions   Esomeprazole Magnesium Anaphylaxis   unkn   Atorvastatin    Ciprofibrate Itching   Ciprofloxacin Itching   Gabapentin    Achy   Lubiprostone Diarrhea   Statins    Flu like symptoms   Amlodipine Rash   Latex Rash, Swelling   Other Rash, Swelling        Medication List     TAKE these medications    albuterol 108 (90 Base) MCG/ACT inhaler Commonly known as: VENTOLIN HFA Inhale 2 puffs into the lungs every 4 (four) hours as needed for wheezing or shortness of breath.   aspirin EC 81 MG tablet Take 81 mg by mouth daily.   blood glucose meter kit and supplies Dispense based on patient and insurance preference. Use up to four times daily as directed. (FOR ICD-10 E10.9, E11.9).   carvedilol 3.125 MG tablet Commonly known as: COREG Take 3.125 mg by mouth 2 (two) times daily with a meal.   clopidogrel 75 MG tablet Commonly known as: PLAVIX Take 75 mg by mouth daily.   ezetimibe 10 MG tablet Commonly known as: ZETIA Take 10 mg by mouth daily.   famotidine 20 MG tablet Commonly known as: PEPCID Take 20 mg by mouth 2 (two) times daily.   furosemide 20 MG tablet Commonly known as: LASIX Take 2 tablets (40 mg total) by mouth daily.   insulin glargine 100 UNIT/ML Solostar Pen Commonly known as: LANTUS Inject 8  Units into the skin daily. Further refills by PCP   Jardiance 10 MG Tabs tablet Generic drug: empagliflozin Take 10 mg by mouth daily.   lisinopril 5 MG tablet Commonly known as: ZESTRIL Take 5 mg by mouth daily.   Melatonin 10 MG Tabs Take 30 mg by mouth at bedtime.   nitroGLYCERIN 0.4 MG SL tablet Commonly known as: NITROSTAT Place 0.4 mg under the tongue every 5 (five) minutes as needed for chest pain.   OVER THE COUNTER MEDICATION Take 2 tablets by mouth at bedtime. Anxiety and stress relief 2 tablets at night   Pen Needles 3/16" 31G X 5 MM Misc 1 each by Does not apply route 1 day or 1 dose for 1 dose. Further refills by PCP   RABEprazole 20 MG tablet Commonly known as: ACIPHEX Take 40 mg by mouth daily.        Outstanding Labs/Studies   None  Duration of Discharge Encounter   Greater than 30 minutes including physician time.  Mahalia Longest [place], PA 04/08/2020, 1:23 PM

## 2020-04-08 NOTE — Care Management (Signed)
1018 04-08-20 Case Manager received consult for benefits check for Julie Hernandez, and Farxiga . Case Manager will follow for cost. Graves-Bigelow, Lamar Laundry, RN,BSN Case Manager

## 2020-04-08 NOTE — Plan of Care (Signed)
Pt d/c to home, printed and verbal  education and procedure and site care complete, reinforced need to keep scheduled follow up appointments, prescriptions for glucometer and lantus given, printed diabetes education given, encouraged to follow up with pcp and follow a diabetic diet, pt verbalized understanding of all instruction.

## 2020-04-08 NOTE — TOC Benefit Eligibility Note (Signed)
Transition of Care [facility]) Benefit Eligibility Note    Patient Details  Name: Julie Hernandez MRN: [id number] Date of Birth: December 13, 1970   Medication/Dose: JARDIANCE 10 MG DAILY   CO-PAY- $ 45.00   INVOKANA 100 MG OR 300 MG  DAILY  CO-PAY-$ 45.00  ( INVOKANA  200 MG DAILY -NON-FORMULARY )  Covered?: Yes  Tier: 3 Drug  Prescription Coverage Preferred Pharmacy: [facility] with Person/Company/Phone Number:: PORTIA  @ [employer]  QJ #  [phone]  Co-Pay: $45.00  Prior Approval: Yes  Deductible: Met  Additional Notes: FARXIGA  5 MG  DAILY : NON-FORMULARY  P/A-YES # [phone]    Memory Argue Phone Number: 04/08/2020, 12:41 PM

## 2020-04-08 NOTE — Discharge Instructions (Signed)
Coronary Artery Disease, Female Coronary artery disease (CAD) is a condition in which the arteries that lead to the heart (coronary arteries) become narrow or blocked. The narrowing or blockage can lead to decreased blood flow to the heart. Prolonged reduced blood flow can cause a heart attack (myocardial infarction or MI). This condition may also be called coronary heart disease. Because CAD is the leading cause of death in women, it is important to understand what causes this condition and how it is treated. What are the causes? CAD is most often caused by atherosclerosis. This is the buildup of fat and cholesterol (plaque) on the inside of the arteries. Over time, the plaque may narrow or block the artery, reducing blood flow to the heart. Plaque can also become weak and break off within a coronary artery and cause a sudden blockage. Other less common causes of CAD include:  A blood clot or a piece of a blood clot or other substance that blocks the flow of blood in a coronary artery (embolism).  A tearing of the artery (spontaneous coronary artery dissection).  An enlargement of an artery (aneurysm).  Inflammation (vasculitis) in the artery wall. What increases the risk? The following factors may make you more likely to develop this condition:  Age. Women over age 55 are at a greater risk of CAD.  Family history of CAD.  High blood pressure (hypertension).  Diabetes.  High cholesterol levels.  Tobacco use.  Lack of exercise.  Menopause. ? All postmenopausal women are at greater risk of CAD. ? Women who have experienced menopause between the ages of 40-45 (early menopause) are at a higher risk of CAD. ? Women who have experienced menopause before age 40 (premature menopause) are at a very high risk of CAD.  Excessive alcohol use.  A diet high in saturated and trans fats, such as fried food and processed meat. Other possible risk factors include:  High stress  levels.  Depression.  Obesity.  Sleep apnea. What are the signs or symptoms? Many people do not have any symptoms during the early stages of CAD. As the condition progresses, symptoms may include:  Chest pain (angina). The pain can: ? Feel like crushing or squeezing, or like a tightness, pressure, fullness, or heaviness in the chest. ? Last more than a few minutes or can stop and recur. The pain tends to get worse with exercise or stress and to fade with rest.  Pain in the arms, neck, jaw, ear, or back.  Unexplained heartburn or indigestion.  Shortness of breath.  Nausea.  Sudden cold sweats.  Sudden light-headedness.  Fluttering or fast heartbeat (palpitations). Many women have chest discomfort and the other symptoms. However, women often have unusual (atypical) symptoms, such as:  Fatigue.  Vomiting.  Unexplained feelings of nervousness or anxiety.  Unexplained weakness.  Dizziness or fainting. How is this diagnosed? This condition is diagnosed based on:  Your family and medical history.  A physical exam.  Tests, including: ? A test to check the electrical signals in your heart (electrocardiogram). ? Exercise stress test. This looks for signs of blockage when the heart is stressed with exercise, such as running on a treadmill. ? Pharmacologic stress test. This test looks for signs of blockage when the heart is being stressed with a medicine. ? Blood tests. ? Coronary angiogram. This is a procedure to look at the coronary arteries to see if there is any blockage. During this test, a dye is injected into your arteries so they   appear on an X-ray. ? Coronary artery CT scan. This CT scan helps detect calcium deposits in your coronary arteries. Calcium deposits are an indicator of CAD. ? A test that uses sound waves to take a picture of your heart (echocardiogram). ? Chest X-ray. How is this treated? This condition may be treated by:  Healthy lifestyle changes to  reduce risk factors.  Medicines such as: ? Antiplatelet medicines and blood-thinning medicines, such as aspirin. These help to prevent blood clots. ? Nitroglycerin. ? Blood pressure medicines. ? Cholesterol-lowering medicine.  Coronary angioplasty and stenting. During this procedure, a thin, flexible tube is inserted through a blood vessel and into a blocked artery. A balloon or similar device on the end of the tube is inflated to open up the artery. In some cases, a small, mesh tube (stent) is inserted into the artery to keep it open.  Coronary artery bypass surgery. During this surgery, veins or arteries from other parts of the body are used to create a bypass around the blockage and allow blood to reach your heart. Follow these instructions at home: Medicines  Take over-the-counter and prescription medicines only as told by your health care provider.  Do not take the following medicines unless your health care provider approves: ? NSAIDs, such as ibuprofen, naproxen, or celecoxib. ? Vitamin supplements that contain vitamin A, vitamin E, or both. ? Hormone replacement therapy that contains estrogen with or without progestin. Lifestyle  Follow an exercise program approved by your health care provider. Aim for 150 minutes of moderate exercise or 75 minutes of vigorous exercise each week.  Maintain a healthy weight or lose weight as approved by your health care provider.  Learn to manage stress or try to limit your stress. Ask your health care provider for suggestions if you need help.  Get screened for depression and seek treatment, if needed.  Do not use any products that contain nicotine or tobacco, such as cigarettes, e-cigarettes, and chewing tobacco. If you need help quitting, ask your health care provider.  Do not use illegal drugs. Eating and drinking   Follow a heart-healthy diet. A dietitian can help educate you about healthy food options and changes. In general, eat  plenty of fruits and vegetables, lean meats, and whole grains.  Avoid foods high in: ? Sugar. ? Salt (sodium). ? Saturated fats, such as processed or fatty meat. ? Trans fats, such as fried food.  Use healthy cooking methods such as roasting, grilling, broiling, baking, poaching, steaming, or stir-frying.  Do not drink alcohol if: ? Your health care provider tells you not to drink. ? You are pregnant, may be pregnant, or are planning to become pregnant.  If you drink alcohol: ? Limit how much you have to 0-1 drink a day. ? Be aware of how much alcohol is in your drink. In the [place] one drink equals one 12 oz bottle of beer (355 mL), one 5 oz glass of wine (148 mL), or one 1 oz glass of hard liquor (44 mL). General instructions  Manage any other health conditions, such as hypertension and diabetes. These conditions affect your heart.  Your health care provider may ask you to monitor your blood pressure. Ideally, your blood pressure should be below 130/80.  Keep all follow-up visits as told by your health care provider. This is important. Get help right away if:  You have pain in your chest, neck, ear, arm, jaw, stomach, or back that: ? Lasts more than a few minutes. ?   Is recurring. ? Is not relieved by taking medicine under your tongue (sublingual nitroglycerin).  You have profuse sweating without cause.  You have unexplained: ? Heartburn or indigestion. ? Shortness of breath or difficulty breathing. ? Fluttering or fast heartbeat (palpitations). ? Nausea or vomiting. ? Fatigue. ? Feelings of nervousness or anxiety. ? Weakness. ? Diarrhea.  You have sudden light-headedness or dizziness.  You faint.  You feel like hurting yourself or think about taking your own life. These symptoms may represent a serious problem that is an emergency. Do not wait to see if the symptoms will go away. Get medical help right away. Call your local emergency services (911 in the [place] Do  not drive yourself to the hospital. Summary  Coronary artery disease (CAD) is a condition in which the arteries that lead to the heart (coronary arteries) become narrow or blocked. The narrowing or blockage can lead to a heart attack.  Many women have chest discomfort and other common symptoms of CAD. However, women often have unusual (atypical) symptoms, such as fatigue, vomiting, weakness, or dizziness.  CAD can be treated with lifestyle changes, medicines, surgery, or a combination of these treatments. This information is not intended to replace advice given to you by your health care provider. Make sure you discuss any questions you have with your health care provider. Document Revised: 07/25/2018 Document Reviewed: 07/15/2018 Elsevier Patient Education  2020 Elsevier Inc.  

## 2020-04-13 ENCOUNTER — Telehealth (HOSPITAL_COMMUNITY): Payer: Self-pay

## 2020-04-13 ENCOUNTER — Ambulatory Visit: Payer: PPO

## 2020-04-13 NOTE — Telephone Encounter (Signed)
Faxed referral to [facility]. 

## 2020-04-20 ENCOUNTER — Ambulatory Visit (HOSPITAL_COMMUNITY)
Admission: RE | Admit: 2020-04-20 | Discharge: 2020-04-20 | Disposition: A | Discharge status: Home / Self Care | Payer: PPO | Source: Ambulatory Visit | Attending: Internal Medicine | Admitting: Internal Medicine

## 2020-04-20 ENCOUNTER — Other Ambulatory Visit: Payer: Self-pay

## 2020-04-20 ENCOUNTER — Encounter (HOSPITAL_COMMUNITY): Payer: Self-pay | Admitting: Internal Medicine

## 2020-04-20 VITALS — BP 130/80 | HR 68 | Wt 169.2 lb

## 2020-04-20 DIAGNOSIS — I252 Old myocardial infarction: Secondary | ICD-10-CM | POA: Insufficient documentation

## 2020-04-20 DIAGNOSIS — Z79899 Other long term (current) drug therapy: Secondary | ICD-10-CM | POA: Diagnosis not present

## 2020-04-20 DIAGNOSIS — I255 Ischemic cardiomyopathy: Secondary | ICD-10-CM | POA: Diagnosis not present

## 2020-04-20 DIAGNOSIS — I10 Essential (primary) hypertension: Secondary | ICD-10-CM | POA: Diagnosis not present

## 2020-04-20 DIAGNOSIS — Z7984 Long term (current) use of oral hypoglycemic drugs: Secondary | ICD-10-CM | POA: Insufficient documentation

## 2020-04-20 DIAGNOSIS — I25118 Atherosclerotic heart disease of native coronary artery with other forms of angina pectoris: Secondary | ICD-10-CM | POA: Insufficient documentation

## 2020-04-20 DIAGNOSIS — Z87891 Personal history of nicotine dependence: Secondary | ICD-10-CM | POA: Diagnosis not present

## 2020-04-20 DIAGNOSIS — Z8241 Family history of sudden cardiac death: Secondary | ICD-10-CM | POA: Insufficient documentation

## 2020-04-20 DIAGNOSIS — I236 Thrombosis of atrium, auricular appendage, and ventricle as current complications following acute myocardial infarction: Secondary | ICD-10-CM | POA: Insufficient documentation

## 2020-04-20 DIAGNOSIS — Z7902 Long term (current) use of antithrombotics/antiplatelets: Secondary | ICD-10-CM | POA: Diagnosis not present

## 2020-04-20 DIAGNOSIS — I493 Ventricular premature depolarization: Secondary | ICD-10-CM

## 2020-04-20 DIAGNOSIS — I5022 Chronic systolic (congestive) heart failure: Secondary | ICD-10-CM | POA: Insufficient documentation

## 2020-04-20 DIAGNOSIS — Z7982 Long term (current) use of aspirin: Secondary | ICD-10-CM | POA: Diagnosis not present

## 2020-04-20 DIAGNOSIS — I209 Angina pectoris, unspecified: Secondary | ICD-10-CM | POA: Diagnosis not present

## 2020-04-20 DIAGNOSIS — Z823 Family history of stroke: Secondary | ICD-10-CM | POA: Diagnosis not present

## 2020-04-20 DIAGNOSIS — I219 Acute myocardial infarction, unspecified: Secondary | ICD-10-CM | POA: Diagnosis not present

## 2020-04-20 DIAGNOSIS — Z955 Presence of coronary angioplasty implant and graft: Secondary | ICD-10-CM | POA: Insufficient documentation

## 2020-04-20 DIAGNOSIS — E1165 Type 2 diabetes mellitus with hyperglycemia: Secondary | ICD-10-CM | POA: Diagnosis not present

## 2020-04-20 DIAGNOSIS — I110 Hypertensive heart disease with heart failure: Secondary | ICD-10-CM | POA: Insufficient documentation

## 2020-04-20 DIAGNOSIS — I2510 Atherosclerotic heart disease of native coronary artery without angina pectoris: Secondary | ICD-10-CM | POA: Diagnosis not present

## 2020-04-20 DIAGNOSIS — I248 Other forms of acute ischemic heart disease: Secondary | ICD-10-CM | POA: Diagnosis not present

## 2020-04-20 DIAGNOSIS — E119 Type 2 diabetes mellitus without complications: Secondary | ICD-10-CM | POA: Diagnosis not present

## 2020-04-20 DIAGNOSIS — E785 Hyperlipidemia, unspecified: Secondary | ICD-10-CM | POA: Insufficient documentation

## 2020-04-20 DIAGNOSIS — G4709 Other insomnia: Secondary | ICD-10-CM | POA: Diagnosis not present

## 2020-04-20 DIAGNOSIS — E782 Mixed hyperlipidemia: Secondary | ICD-10-CM | POA: Diagnosis not present

## 2020-04-20 DIAGNOSIS — I719 Aortic aneurysm of unspecified site, without rupture: Secondary | ICD-10-CM | POA: Insufficient documentation

## 2020-04-20 DIAGNOSIS — I513 Intracardiac thrombosis, not elsewhere classified: Secondary | ICD-10-CM

## 2020-04-20 DIAGNOSIS — M5136 Other intervertebral disc degeneration, lumbar region: Secondary | ICD-10-CM | POA: Diagnosis not present

## 2020-04-20 DIAGNOSIS — I429 Cardiomyopathy, unspecified: Secondary | ICD-10-CM | POA: Diagnosis not present

## 2020-04-20 LAB — BASIC METABOLIC PANEL
Anion gap: 9 (ref 5–15)
BUN: 26 mg/dL — ABNORMAL HIGH (ref 6–20)
CO2: 22 mmol/L (ref 22–32)
Calcium: 9.3 mg/dL (ref 8.9–10.3)
Chloride: 108 mmol/L (ref 98–111)
Creatinine, Ser: 1.18 mg/dL — ABNORMAL HIGH (ref 0.44–1.00)
GFR calc Af Amer: >60 mL/min (ref >60)
GFR calc non Af Amer: 54 mL/min — ABNORMAL LOW (ref >60)
Glucose, Bld: 229 mg/dL — ABNORMAL HIGH (ref 70–99)
Potassium: 5.3 mmol/L — ABNORMAL HIGH (ref 3.5–5.1)
Sodium: 139 mmol/L (ref 135–145)

## 2020-04-20 LAB — MAGNESIUM: Magnesium: 2.1 mg/dL (ref 1.7–2.4)

## 2020-04-20 LAB — BRAIN NATRIURETIC PEPTIDE: B Natriuretic Peptide: 166.2 pg/mL — ABNORMAL HIGH (ref 0.0–100.0)

## 2020-04-20 MED ORDER — SPIRONOLACTONE 25 MG PO TABS
12.5000 mg | ORAL_TABLET | Freq: Every day | ORAL | 3 refills | Status: DC
Start: 2020-04-20 — End: 2020-10-25

## 2020-04-20 NOTE — Patient Instructions (Addendum)
Start Spironolactone 12.5 mg (1/2 tab) daily  Labs done today, your results will be available in MyChart, we will contact you for abnormal readings.  Labs needed in 1 week, we have provided you a prescription to have this done in [place]  You have been referred to Dr Marylouise Stacks for Diabetes Management, her office will call you for an appointment  Your provider has recommended that  you wear a Zio Patch for 14 days.  This monitor will record your heart rhythm for our review.  IF you have any symptoms while wearing the monitor please press the button.  If you have any issues with the patch or you notice a red or orange light on it please call the company at [phone].  Once you remove the patch please mail it back to the company as soon as possible so we can get the results.  Your physician recommends that you schedule a follow-up appointment in: 6-8 weeks  If you have any questions or concerns before your next appointment please send [place] a message through [place] or call our office at [phone].    TO LEAVE A MESSAGE FOR THE NURSE SELECT OPTION 2, PLEASE LEAVE A MESSAGE INCLUDING: . YOUR NAME . DATE OF BIRTH . CALL BACK NUMBER . REASON FOR CALL**this is important as we prioritize the call backs  YOU WILL RECEIVE A CALL BACK THE SAME DAY AS LONG AS YOU CALL BEFORE 4:00 PM  At the [facility], you and your health needs are our priority. As part of our continuing mission to provide you with exceptional heart care, we have created designated Provider Care Teams. These Care Teams include your primary Cardiologist (physician) and Advanced Practice Providers (APPs- Physician Assistants and Nurse Practitioners) who all work together to provide you with the care you need, when you need it.   You may see any of the following providers on your designated Care Team at your next follow up: Marland Kitchen Dr Arvilla Meres . Dr Marca Ancona . Tonye Becket, NP . Robbie Lis, PA . Karle Plumber, [name]   Please be sure to bring in all your medications bottles to every appointment.

## 2020-04-20 NOTE — Progress Notes (Signed)
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  Had anterior MI in 2010 taken emergently to [facility]. Stent was placed emergently. Post stenting she continued to have CP. Went to [facility] that same year and had a a second stent placed. Did well from a cardiac perspective since that time. Reports she has failed all statins due to intolerance. Has been on Zetia. Also has DM2 and says she previously was on insulin but now controls it with diet. Quit smoking 1 month ago,.   In March had NSTEMI. Had cath 02/08/20 at [facility]. 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 Was told she may need repeat stenting vs CABG. Referred here for second opinion.   We saw her in May with daily angina. Underwent MRI which showed minimal viability in anterior wall. Decision to proceed with PCI of LCX and LAD (ISR). Had successful PCI of LCX and LAD with Dr. Burt Knack on 5/20.   Returns for follow-up still with occasional episodes of CP with exertion and at rest but significantly improved. Not bad enough to take to take NTG. Having some atypical sharp pain radiating into belly but that has not recurred. No edema, orthopnea or PND. BP at home has been 90-100 range. + palpitations  Going to see Lipid clinic tomorrow for possible PCVSK-9 (intoelrant of all statins)  HgbA1c in hospital 11%   cMRI 03/29/20 1. Subendocardial late gadolinium enhancement consistent with prior infarct in the LAD territory. There is >50% transmural LGE suggesting nonviability  in the basal to apical anterior wall and apex. There is <50% transmural LGE suggesting viability in the basal to mid anteroseptum and apical septum  2.  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 Normal LV size with severe systolic dysfunction (EF 2667% Akinesis of basal to apical anterior/anteroseptal walls and apex  4.  Small RV size with normal systolic function (EF 6261%     Past Medical History:  Diagnosis Date  . Coronary artery disease   . Diabetes mellitus without complication [place]  . Hypertension     Current Outpatient Medications  Medication Sig Dispense Refill  . albuterol (VENTOLIN HFA) 108 (90 Base) MCG/ACT inhaler Inhale 2 puffs into the lungs every 4 (four) hours as needed for wheezing or shortness of breath.    . Marland Kitchenspirin EC 81 MG tablet Take 81 mg by mouth daily.    . blood glucose meter kit and supplies Dispense based on patient and insurance preference. Use up to four times daily as directed. (FOR ICD-10 E10.9, E11.9). 1 each 1  . carvedilol (COREG) 3.125 MG tablet Take 3.125 mg by mouth 2 (two) times daily with a meal.    . clopidogrel (PLAVIX) 75 MG tablet Take 75 mg by mouth daily.    . empagliflozin (JARDIANCE) 10 MG TABS tablet Take 10 mg by mouth daily. 30 tablet 11  . ezetimibe (ZETIA) 10 MG tablet Take 10 mg by mouth daily.    . famotidine (PEPCID) 20 MG tablet  Take 20 mg by mouth 2 (two) times daily.    . furosemide (LASIX) 20 MG tablet Take 2 tablets (40 mg total) by mouth daily. 60 tablet 11  . insulin glargine (LANTUS) 100 UNIT/ML Solostar Pen Inject 8 Units into the skin daily. Further refills by PCP 15 mL 0  . lisinopril (ZESTRIL) 5 MG tablet Take 5 mg by mouth daily.    . Melatonin 10 MG TABS Take 30 mg by mouth at bedtime.     . nitroGLYCERIN (NITROSTAT) 0.4 MG SL tablet Place 0.4 mg under the tongue every 5 (five) minutes as needed for chest pain.    Marland Kitchen OVER THE COUNTER MEDICATION Take 2 tablets by mouth at bedtime. Anxiety and stress relief  2 tablets at night     . RABEprazole (ACIPHEX) 20 MG tablet Take 40 mg by mouth daily.     No current facility-administered medications for this encounter.    Allergies  Allergen Reactions  . Esomeprazole Magnesium Anaphylaxis    unkn  . Atorvastatin   . Ciprofibrate Itching  . Ciprofloxacin Itching  . Gabapentin     Achy  . Lubiprostone Diarrhea  . Statins     Flu like symptoms  . Amlodipine Rash  . Latex Rash and Swelling  . Other Rash and Swelling      Social History   Socioeconomic History  . Marital status: Divorced    Spouse name: Not on file  . Number of children: Not on file  . Years of education: Not on file  . Highest education level: Not on file  Occupational History  . Not on file  Tobacco Use  . Smoking status: Former Smoker    Quit date: 02/18/2020    Years since quitting: 0.1  . Smokeless tobacco: Never Used  Substance and Sexual Activity  . Alcohol use: Yes    Comment: SOCIAL  . Drug use: Never  . Sexual activity: Not on file  Other Topics Concern  . Not on file  Social History Narrative  . Not on file   Social Determinants of Health   Financial Resource Strain:   . Difficulty of Paying Living Expenses:   Food Insecurity:   . Worried About Charity fundraiser in the Last Year:   . Arboriculturist in the Last Year:   Transportation Needs:   . Film/video editor (Medical):   Marland Kitchen Lack of Transportation (Non-Medical):   Physical Activity:   . Days of Exercise per Week:   . Minutes of Exercise per Session:   Stress:   . Feeling of Stress :   Social Connections:   . Frequency of Communication with Friends and Family:   . Frequency of Social Gatherings with Friends and Family:   . Attends Religious Services:   . Active Member of Clubs or Organizations:   . Attends Archivist Meetings:   Marland Kitchen Marital Status:   Intimate Partner Violence:   . Fear of Current or Ex-Partner:   . Emotionally Abused:   Marland Kitchen Physically Abused:   .  Sexually Abused:     FHx:  M died at 71 from MI and HF D had stroke No brother and sisters  Vitals:   04/20/20 1017  BP: 130/80  Pulse: 68  SpO2: 100%  Weight: 76.7 kg (169 lb 3.2 oz)    PHYSICAL EXAM: General:  Well appearing. No resp difficulty HEENT: normal Neck: supple. no JVD. Carotids 2+ bilat; no bruits. No lymphadenopathy or  thryomegaly appreciated. Cor: PMI nondisplaced. Regularly irregular  No rubs, gallops or murmurs. Lungs: clear Abdomen: obese soft, nontender, nondistended. No hepatosplenomegaly. No bruits or masses. Good bowel sounds. Extremities: no cyanosis, clubbing, rash, edema Neuro: alert & orientedx3, cranial nerves grossly intact. moves all 4 extremities w/o difficulty. Affect pleasant   ECG: NSR 82  Anteroseptal Q. 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 Personally reviewed   ASSESSMENT & PLAN:  1. CAD - s/p PCI of LCX and LAD (ISR) in 5/21. 50% mRCA lesion Still with some residual angina possible due to chronically-jailed diagonal. Scheduled for CR. Can add Imdur as needed - Continue ASA/Plavix for 30 days - At f/u can consider adding Eliquis for LV clot and drop ASA - She has been intolerant of all statins. LDL quite high on Zetia. Seeing Lipid Clinic tomorrow for PCSK-9  2. 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  - NYHA II-early III - Continue carvedilol, Jardiance and low-dose lisinopril. (BP too low for Entresto) - Add spiro 12.5 carefully. Labs today and 1 week   3. LV apical clot - as above. Switch ASA to Eliquis in a few weeks  - Clot likely laminated at this point so less urgent  4. Hyperlipidemia - She has been intolerant of all statins. LDL quite high on Zetia.  Seeing Lipid Clinic tomorrow for PCSK-9  5. DM2 - HgBa1c 11.0 - On Jardiance.  - Refer to Dr. Monna Fam. - Stressed need for better control   6. Tobacco use - recently quit -  encourage sustained cessation  7. Frequent PVCs - check Zio - if > 10% will need to consider suppression and sleep study   Glori Bickers, MD  10:49 AM

## 2020-04-21 ENCOUNTER — Telehealth (HOSPITAL_COMMUNITY): Payer: Self-pay | Admitting: Pharmacy Technician

## 2020-04-21 ENCOUNTER — Ambulatory Visit (INDEPENDENT_AMBULATORY_CARE_PROVIDER_SITE_OTHER): Payer: PPO | Admitting: Pharmacist

## 2020-04-21 ENCOUNTER — Ambulatory Visit: Payer: PPO

## 2020-04-21 DIAGNOSIS — E782 Mixed hyperlipidemia: Secondary | ICD-10-CM | POA: Diagnosis not present

## 2020-04-21 MED ORDER — REPATHA SURECLICK 140 MG/ML ~~LOC~~ SOAJ: 1.0000 "pen " | SUBCUTANEOUS | 11 refills | Status: DC

## 2020-04-21 NOTE — Telephone Encounter (Signed)
Patient Advocate Encounter  Spoke with patient regarding co-pay assistance for Jardiance.  Current co-pay is $45. Patient is not eligible for LIS, due to income. Will have application at check in desk for signature.  Will continue to follow.

## 2020-04-21 NOTE — Progress Notes (Deleted)
Patient ID: Julie Hernandez                 DOB: 1971-11-13                    MRN: [id number]     HPI: Julie Hernandez is a 49 y.o. female patient referred to lipid clinic by ***. PMH is significant for premature CAD s/p anterior MI, uncontrolled DM2 with A1c was 11% in the hospital last month, systolic CHF, and chronic back pain on disability. She first had an MI in 2010 in [facility] and had a stent placed. She continued to have chest pain and had a second stent placed that same year. Pt had an NSTEMI March of 2021. Cath showed 99% in stent restenosis of prox LAD stent, 95% lesion in mid LCx and 50-60% lesion in mRCA. She had PCI to LCx and LAD in May. cMRI 03/29/20 showed LV apical thrombus and systolic dysfunction with LVEF of 26%.  Quit smoking in April - still keeping this up? Seeing anyone for her DM? Not diet controlled, A1c was 11  What statins has she tried? We have no records but chf visit says all statins Need insurance - [id number]  Current Medications: ezetimibe '10mg'$  daily Intolerances: atorvastatin - flu like symptoms Risk Factors: CAD s/p multiple MIs beginning in her 88s, DM, CHF LDL goal: '55mg'$ /dL  Diet:   Exercise:   Family History: Mother died at 63 from MI and HF. Dad had stroke. Only child.  Social History: Former tobacco abuse, quit April 2021. Social alcohol use, denies illicit drug use.  Labs: 04/01/20: TC 227, TG 186, HDL 45, LDL 145 (ezetimibe '10mg'$  daily)  Past Medical History:  Diagnosis Date  . Coronary artery disease   . Diabetes mellitus without complication ([place])   . Hypertension     Current Outpatient Medications on File Prior to Visit  Medication Sig Dispense Refill  . albuterol (VENTOLIN HFA) 108 (90 Base) MCG/ACT inhaler Inhale 2 puffs into the lungs every 4 (four) hours as needed for wheezing or shortness of breath.    Marland Kitchen aspirin EC 81 MG tablet Take 81 mg by mouth daily.    . blood glucose meter kit and supplies Dispense based on patient and insurance  preference. Use up to four times daily as directed. (FOR ICD-10 E10.9, E11.9). 1 each 1  . carvedilol (COREG) 3.125 MG tablet Take 3.125 mg by mouth 2 (two) times daily with a meal.    . clopidogrel (PLAVIX) 75 MG tablet Take 75 mg by mouth daily.    . empagliflozin (JARDIANCE) 10 MG TABS tablet Take 10 mg by mouth daily. 30 tablet 11  . ezetimibe (ZETIA) 10 MG tablet Take 10 mg by mouth daily.    . famotidine (PEPCID) 20 MG tablet Take 20 mg by mouth 2 (two) times daily.    . furosemide (LASIX) 20 MG tablet Take 2 tablets (40 mg total) by mouth daily. 60 tablet 11  . insulin glargine (LANTUS) 100 UNIT/ML Solostar Pen Inject 8 Units into the skin daily. Further refills by PCP 15 mL 0  . lisinopril (ZESTRIL) 5 MG tablet Take 5 mg by mouth daily.    . Melatonin 10 MG TABS Take 30 mg by mouth at bedtime.     . nitroGLYCERIN (NITROSTAT) 0.4 MG SL tablet Place 0.4 mg under the tongue every 5 (five) minutes as needed for chest pain.    Marland Kitchen OVER THE COUNTER MEDICATION Take 2 tablets by  mouth at bedtime. Anxiety and stress relief 2 tablets at night     . RABEprazole (ACIPHEX) 20 MG tablet Take 40 mg by mouth daily.    Marland Kitchen spironolactone (ALDACTONE) 25 MG tablet Take 0.5 tablets (12.5 mg total) by mouth daily. 15 tablet 3   No current facility-administered medications on file prior to visit.    Allergies  Allergen Reactions  . Esomeprazole Magnesium Anaphylaxis    unkn  . Atorvastatin   . Ciprofibrate Itching  . Ciprofloxacin Itching  . Gabapentin     Achy  . Lubiprostone Diarrhea  . Statins     Flu like symptoms  . Amlodipine Rash  . Latex Rash and Swelling  . Other Rash and Swelling    Assessment/Plan:  1. Hyperlipidemia -

## 2020-04-21 NOTE — Progress Notes (Signed)
Patient ID: Julie Hernandez                 DOB: 26-Dec-1970                    MRN: [id number]     HPI: Julie Hernandez is a 49 y.o. female patient referred to lipid clinic by Dr Burt Knack. PMH is significant for premature CAD s/p anterior MI, uncontrolled DM2 with A1c was 11% in the hospital last month, systolic CHF, and chronic back pain on disability. She first had an MI in 2010 in [facility] and had a stent placed. She continued to have chest pain and had a second stent placed that same year. Pt had an NSTEMI March of 2021. Cath showed 99% in stent restenosis of prox LAD stent, 95% lesion in mid LCx and 50-60% lesion in mRCA. She had PCI to LCx and LAD in May. cMRI 03/29/20 showed LV apical thrombus and systolic dysfunction with LVEF of 26%.  Pt presents today in good spirits. She previously tried atorvastatin and rosuvastatin after her first MI in 2010. Both statins caused head to toe body aches that felt like the flu. Symptoms appeared within a few weeks of starting each statin and resolved after statin discontinuation. She is being referred to an endocrinologist for her diabetes. Still does not have her normal level of strength back since her recent MI in March. Likely multi-factorial due to uncontrolled DM and HF.  Current Medications: ezetimibe '10mg'$  daily Intolerances: atorvastatin, rosuvastatin - flu like symptoms Risk Factors: CAD s/p multiple MIs beginning in her 76s, DM, CHF, family history of premature CAD LDL goal: '55mg'$ /dL  Diet: Craving more carbs lately. Does not eat breakfast. Healthy Choice meals, salads, chicken, snacking on muffins and crackers. Drinks water or diet mountain dew.  Exercise: Walks, housework. Still fatigued-  Taking frequent breaks. Signed up for cardiac rehab 3 days a week  Family History: Mother died at 77 from MI and HF. Dad had stroke. Only child.  Social History: Former tobacco abuse, quit April 2021. Social alcohol use, denies illicit drug use.  Labs: 04/01/20:  TC 227, TG 186, HDL 45, LDL 145 (ezetimibe '10mg'$  daily)  Past Medical History:  Diagnosis Date  . Coronary artery disease   . Diabetes mellitus without complication ([place])   . Hypertension     Current Outpatient Medications on File Prior to Visit  Medication Sig Dispense Refill  . albuterol (VENTOLIN HFA) 108 (90 Base) MCG/ACT inhaler Inhale 2 puffs into the lungs every 4 (four) hours as needed for wheezing or shortness of breath.    Marland Kitchen aspirin EC 81 MG tablet Take 81 mg by mouth daily.    . blood glucose meter kit and supplies Dispense based on patient and insurance preference. Use up to four times daily as directed. (FOR ICD-10 E10.9, E11.9). 1 each 1  . carvedilol (COREG) 3.125 MG tablet Take 3.125 mg by mouth 2 (two) times daily with a meal.    . clopidogrel (PLAVIX) 75 MG tablet Take 75 mg by mouth daily.    . empagliflozin (JARDIANCE) 10 MG TABS tablet Take 10 mg by mouth daily. 30 tablet 11  . ezetimibe (ZETIA) 10 MG tablet Take 10 mg by mouth daily.    . famotidine (PEPCID) 20 MG tablet Take 20 mg by mouth 2 (two) times daily.    . furosemide (LASIX) 20 MG tablet Take 2 tablets (40 mg total) by mouth daily. 60 tablet 11  . insulin  glargine (LANTUS) 100 UNIT/ML Solostar Pen Inject 8 Units into the skin daily. Further refills by PCP 15 mL 0  . lisinopril (ZESTRIL) 5 MG tablet Take 5 mg by mouth daily.    . Melatonin 10 MG TABS Take 30 mg by mouth at bedtime.     . nitroGLYCERIN (NITROSTAT) 0.4 MG SL tablet Place 0.4 mg under the tongue every 5 (five) minutes as needed for chest pain.    Marland Kitchen OVER THE COUNTER MEDICATION Take 2 tablets by mouth at bedtime. Anxiety and stress relief 2 tablets at night     . RABEprazole (ACIPHEX) 20 MG tablet Take 40 mg by mouth daily.    Marland Kitchen spironolactone (ALDACTONE) 25 MG tablet Take 0.5 tablets (12.5 mg total) by mouth daily. 15 tablet 3   No current facility-administered medications on file prior to visit.    Allergies  Allergen Reactions  .  Esomeprazole Magnesium Anaphylaxis    unkn  . Atorvastatin   . Ciprofibrate Itching  . Ciprofloxacin Itching  . Gabapentin     Achy  . Lubiprostone Diarrhea  . Statins     Flu like symptoms  . Amlodipine Rash  . Latex Rash and Swelling  . Other Rash and Swelling    Assessment/Plan:  1. Hyperlipidemia - LDL 145 above goal < 55 due to recurrent ASCVD. Discussed PCSK9i therapy including expected benefits, side effects, and injection techniques. Will start Repatha 157m SQ every 2 weeks. Pt will continue on ezetimibe 157mdaily. Can consider changing to Nexlizet if LDL remains above goal after addition of PCSK9i therapy. Pt has been approved for [employer] to restrictive copay of $100/month without it. Prior auth submitted and approved through 06/20/20. Will schedule follow up labs ~6-8 weeks after starting Repatha.  2. Medication affordability - Julie Hernandez expensive for pt. I have reached out to pharmacy team at CHF clinic to follow up with pt regarding potential patient assistance.  Julie Hernandez E. Asa Fath, [name], BCACP, [id number]. [email or web address] [id number]: [phone]: ([phone]/01/2020 12:23 PM

## 2020-04-21 NOTE — Patient Instructions (Addendum)
It was nice to meet you today!  Your LDL is 145 and your goal is < 55  I'll send information to your insurance for Repatha injections. They are stored in the fridge, injected into the fatty tissue in your stomach or upper outer thigh ever 2 weeks, and lower your LDL cholesterol by 60%  I'll also apply for a grant through the [facility] which would cover the remainder of your copay that your insurance does not cover  We'll recheck your cholesterol in ~2 months

## 2020-04-27 ENCOUNTER — Telehealth (HOSPITAL_COMMUNITY): Payer: Self-pay | Admitting: Cardiology

## 2020-04-27 NOTE — Telephone Encounter (Signed)
Patient called to report increase in chest pressure/discomfort during cardiac rehab intake. B/p 90/47 sitting 86/52 Patient was advised to not take NTG if chest pains returned with her bp that low.  If chest pains returned she should call 911 for immediate assistance  Patient reports she has been taking it easy this afternoon and chest pains have not returned- aware to contact 911/ems if they return Advised I would be sure to have provider review note from cardiac rehab and follow up with he first thing in the morning

## 2020-04-27 NOTE — Telephone Encounter (Signed)
Per VO Dr Gala Romney Hold lasix  Patient reports she has already taken for today but will be sure to hold until further notice

## 2020-04-28 ENCOUNTER — Other Ambulatory Visit (HOSPITAL_COMMUNITY): Payer: Self-pay | Admitting: *Deleted

## 2020-04-28 ENCOUNTER — Other Ambulatory Visit: Payer: Self-pay

## 2020-04-28 ENCOUNTER — Ambulatory Visit (HOSPITAL_COMMUNITY)
Admission: RE | Admit: 2020-04-28 | Discharge: 2020-04-28 | Disposition: A | Discharge status: Home / Self Care | Payer: PPO | Source: Ambulatory Visit | Attending: Cardiology | Admitting: Cardiology

## 2020-04-28 DIAGNOSIS — I5022 Chronic systolic (congestive) heart failure: Secondary | ICD-10-CM | POA: Insufficient documentation

## 2020-04-28 LAB — BASIC METABOLIC PANEL
Anion gap: 9 (ref 5–15)
BUN: 30 mg/dL — ABNORMAL HIGH (ref 6–20)
CO2: 20 mmol/L — ABNORMAL LOW (ref 22–32)
Calcium: 9 mg/dL (ref 8.9–10.3)
Chloride: 108 mmol/L (ref 98–111)
Creatinine, Ser: 0.96 mg/dL (ref 0.44–1.00)
GFR calc Af Amer: >60 mL/min (ref >60)
GFR calc non Af Amer: >60 mL/min (ref >60)
Glucose, Bld: 186 mg/dL — ABNORMAL HIGH (ref 70–99)
Potassium: 4.6 mmol/L (ref 3.5–5.1)
Sodium: 137 mmol/L (ref 135–145)

## 2020-04-28 NOTE — Telephone Encounter (Signed)
Returned call to patient to revisit symptoms Reports she is doing some what better today just very fatigued. Is currently visiting  grandmother in SNF. Cp episodes x 2 overnight but they went away without intervention.  B/p readings 112/64, 120/60, and 97/64 Hr ranges 81-47

## 2020-04-28 NOTE — Telephone Encounter (Signed)
Take lasix MWF and as needed

## 2020-04-28 NOTE — Telephone Encounter (Signed)
Labs done today, should pt restart Furosemide? Does she need to start Imdur per last OV Note? Please advise.

## 2020-05-04 NOTE — Telephone Encounter (Signed)
Sent application in via fax 06/10.  Will follow up.

## 2020-05-05 ENCOUNTER — Encounter (HOSPITAL_COMMUNITY): Payer: HMO | Admitting: Internal Medicine

## 2020-05-06 DIAGNOSIS — I10 Essential (primary) hypertension: Secondary | ICD-10-CM | POA: Diagnosis not present

## 2020-05-06 DIAGNOSIS — F419 Anxiety disorder, unspecified: Secondary | ICD-10-CM | POA: Diagnosis not present

## 2020-05-06 DIAGNOSIS — E1165 Type 2 diabetes mellitus with hyperglycemia: Secondary | ICD-10-CM | POA: Diagnosis not present

## 2020-05-06 DIAGNOSIS — E782 Mixed hyperlipidemia: Secondary | ICD-10-CM | POA: Diagnosis not present

## 2020-05-12 ENCOUNTER — Encounter: Payer: Self-pay | Admitting: Internal Medicine

## 2020-05-12 ENCOUNTER — Ambulatory Visit: Payer: PPO | Admitting: Internal Medicine

## 2020-05-12 ENCOUNTER — Other Ambulatory Visit: Payer: Self-pay

## 2020-05-12 ENCOUNTER — Telehealth (HOSPITAL_COMMUNITY): Payer: Self-pay | Admitting: *Deleted

## 2020-05-12 VITALS — BP 118/78 | HR 75 | Ht 62.0 in | Wt 169.0 lb

## 2020-05-12 DIAGNOSIS — E1165 Type 2 diabetes mellitus with hyperglycemia: Secondary | ICD-10-CM | POA: Diagnosis not present

## 2020-05-12 DIAGNOSIS — B373 Candidiasis of vulva and vagina: Secondary | ICD-10-CM | POA: Diagnosis not present

## 2020-05-12 DIAGNOSIS — E1159 Type 2 diabetes mellitus with other circulatory complications: Secondary | ICD-10-CM

## 2020-05-12 DIAGNOSIS — B3731 Acute candidiasis of vulva and vagina: Secondary | ICD-10-CM

## 2020-05-12 MED ORDER — FLUCONAZOLE 150 MG PO TABS
150.0000 mg | ORAL_TABLET | Freq: Every day | ORAL | 1 refills | Status: DC
Start: 2020-05-12 — End: 2020-07-26

## 2020-05-12 MED ORDER — TERCONAZOLE 0.4 % VA CREA: 1.0000 | TOPICAL_CREAM | Freq: Every day | VAGINAL | 0 refills | Status: DC

## 2020-05-12 MED ORDER — METFORMIN HCL 500 MG PO TABS
1000.0000 mg | ORAL_TABLET | Freq: Two times a day (BID) | ORAL | 3 refills | Status: DC
Start: 2020-05-12 — End: 2022-09-20

## 2020-05-12 NOTE — Telephone Encounter (Signed)
Pt called stating her bp and heart rate are normal she feels good now with no chest pain. Pt is ready to return to cardiac rehab but needs cardiac clearance. Cardiac rehab called also requesting clearance. Fax clearance to [phone]  Routed to Dr.Bensimhon

## 2020-05-12 NOTE — Patient Instructions (Addendum)
Please chnage: - Jardiance 10 mg before the first meal of the day - Lantus 8 units at bedtime  Please start Metformin 500 mg with dinner x 4 days. If you tolerate this well, add another Metformin tablet (500 mg) with breakfast x 4 days. If you tolerate this well, add another metformin tablet with dinner (total 1000 mg) x 4 days. If you tolerate this well, add another metformin tablet with breakfast (total 1000 mg). Continue with 1000 mg of metformin 2x a day with breakfast and dinner.  Please use Diflucan x 1 tablet for the yeast infection.  Please start Terconazole Cream 0.9%: - insert 1 applicatorful vaginally at bedtime every night for 1 week, then - insert 1/2 applicatorful at bedtime once weekly indefinitely to maintain effect  Please let me know if the sugars are consistently <80 or >200.  Read the following books: Dr. Alyssa Grove - Program for Reversing Diabetes Dr. Karl Luke - Prevent and Reverse Heart Disease Rip Cyd Silence - The Engine 2 diet  Please return in 2 months with your sugar log.   PATIENT INSTRUCTIONS FOR TYPE 2 DIABETES:  DIET AND EXERCISE Diet and exercise is an important part of diabetic treatment.  We recommended aerobic exercise in the form of brisk walking (working between 40-60% of maximal aerobic capacity, similar to brisk walking) for 150 minutes per week (such as 30 minutes five days per week) along with 3 times per week performing 'resistance' training (using various gauge rubber tubes with handles) 5-10 exercises involving the major muscle groups (upper body, lower body and core) performing 10-15 repetitions (or near fatigue) each exercise. Start at half the above goal but build slowly to reach the above goals. If limited by weight, joint pain, or disability, we recommend daily walking in a swimming pool with water up to waist to reduce pressure from joints while allow for adequate exercise.    BLOOD GLUCOSES Monitoring your blood glucoses is  important for continued management of your diabetes. Please check your blood glucoses 2-4 times a day: fasting, before meals and at bedtime (you can rotate these measurements - e.g. one day check before the 3 meals, the next day check before 2 of the meals and before bedtime, etc.).   HYPOGLYCEMIA (low blood sugar) Hypoglycemia is usually a reaction to not eating, exercising, or taking too much insulin/ other diabetes drugs.  Symptoms include tremors, sweating, hunger, confusion, headache, etc. Treat IMMEDIATELY with 15 grams of Carbs: . 4 glucose tablets .  cup regular juice/soda . 2 tablespoons raisins . 4 teaspoons sugar . 1 tablespoon honey Recheck blood glucose in 15 mins and repeat above if still symptomatic/blood glucose <100.  RECOMMENDATIONS TO REDUCE YOUR RISK OF DIABETIC COMPLICATIONS: * Take your prescribed MEDICATION(S) * Follow a DIABETIC diet: Complex carbs, fiber rich foods, (monounsaturated and polyunsaturated) fats * AVOID saturated/trans fats, high fat foods, >2,300 mg salt per day. * EXERCISE at least 5 times a week for 30 minutes or preferably daily.  * DO NOT SMOKE OR DRINK more than 1 drink a day. * Check your FEET every day. Do not wear tightfitting shoes. Contact us if you develop an ulcer * See your EYE doctor once a year or more if needed * Get a FLU shot once a year * Get a PNEUMONIA vaccine once before and once after age 60 years  GOALS:  * Your Hemoglobin A1c of <7%  * fasting sugars need to be <130 * after meals sugars need to be <180 (  2h after you start eating) * Your Systolic BP should be 140 or lower  * Your Diastolic BP should be 80 or lower  * Your HDL (Good Cholesterol) should be 40 or higher  * Your LDL (Bad Cholesterol) should be 100 or lower. * Your Triglycerides should be 150 or lower  * Your Urine microalbumin (kidney function) should be <30 * Your Body Mass Index should be 25 or lower    Please consider the following ways to cut down  carbs and fat and increase fiber and micronutrients in your diet: - substitute whole grain for white bread or pasta - substitute brown rice for white rice - substitute 90-calorie flat bread pieces for slices of bread when possible - substitute sweet potatoes or yams for white potatoes - substitute humus for margarine - substitute tofu for cheese when possible - substitute almond or rice milk for regular milk (would not drink soy milk daily due to concern for soy estrogen influence on breast cancer risk) - substitute dark chocolate for other sweets when possible - substitute water - can add lemon or orange slices for taste - for diet sodas (artificial sweeteners will trick your body that you can eat sweets without getting calories and will lead you to overeating and weight gain in the long run) - do not skip breakfast or other meals (this will slow down the metabolism and will result in more weight gain over time)  - can try smoothies made from fruit and almond/rice milk in am instead of regular breakfast - can also try old-fashioned (not instant) oatmeal made with almond/rice milk in am - order the dressing on the side when eating salad at a restaurant (pour less than half of the dressing on the salad) - eat as little meat as possible - can try juicing, but should not forget that juicing will get rid of the fiber, so would alternate with eating raw veg./fruits or drinking smoothies - use as little oil as possible, even when using olive oil - can dress a salad with a mix of balsamic vinegar and lemon juice, for e.g. - use agave nectar, stevia sugar, or regular sugar rather than artificial sweateners - steam or broil/roast veggies  - snack on veggies/fruit/nuts (unsalted, preferably) when possible, rather than processed foods - reduce or eliminate aspartame in diet (it is in diet sodas, chewing gum, etc) Read the labels!

## 2020-05-12 NOTE — Progress Notes (Signed)
Patient ID: Tassie Pollett, female   DOB: 1971/06/22, 49 y.o.   MRN: [id number]   This visit occurred during the SARS-CoV-2 public health emergency.  Safety protocols were in place, including screening questions prior to the visit, additional usage of staff PPE, and extensive cleaning of exam room while observing appropriate contact time as indicated for disinfecting solutions.   HPI: Tarica Harl is a 49 y.o.-year-old female, referred by her cardiologist, Dr. Haroldine Laws, for management of DM2, dx in 2011 (initially GDM), insulin-dependent, uncontrolled, with long-term complications (CAD-h/o NSTEMI at 49 y/o and 49 y/o, h/o stent; CHF).  Reviewed HbA1c: Lab Results  Component Value Date   HGBA1C 11.0 (H) 04/08/2020   HGBA1C 9.4 (H) 05/08/2011  05/14/2018: 6.4% 02/20/20219: 6.3% 02/06/2016: 12.6%  Pt is on a regimen of: - Jardiance 10 mg daily before dinner - Lantus 8 units before dinner She has not tried Metformin before.  Pt checks her sugars 0-2x a day and they are: - am: 118-153 - 2h after b'fast: n/c - before lunch: 159 - 2h after lunch: 299 - before dinner: n/c - 2h after dinner: n/c - bedtime: n/c - nighttime: n/c Lowest sugar was 118; she has hypoglycemia awareness at 70.  Highest sugar was 299.  Glucometer: One T Ultra 2  Pt's meals are: - Breakfast: skips  - Lunch: salads, soups/broths + crackers, seldom fast foods - Dinner:  Same as lunch; turkey/chicken - Snacks: no; nuts, pretzel She is not eating bread.  -No history of CKD, last BUN/creatinine:  Lab Results  Component Value Date   BUN 30 (H) 04/28/2020   BUN 26 (H) 04/20/2020   CREATININE 0.96 04/28/2020   CREATININE 1.18 (H) 04/20/2020  On lisinopril 5 mg daily  -+ HL; last set of lipids: Lab Results  Component Value Date   CHOL 227 (H) 04/01/2020   HDL 45 04/01/2020   LDLCALC 145 (H) 04/01/2020   TRIG 186 (H) 04/01/2020   CHOLHDL 5.0 04/01/2020  On Zetia 10, Repatha 140 mg every 2 weeks.  - last  eye exam was in 2019. No DR reportedly.   - no numbness and tingling in her feet.  On ASA 81.  Pt has FH of DM in mother -died of heart disease at 4.  ROS: Constitutional: + weight gain, no weight loss, + fatigue, no subjective hyperthermia, no subjective hypothermia, no nocturia, + excessive urination, + poor sleep Eyes: no blurry vision, no xerophthalmia ENT: no sore throat, no nodules palpated in neck, no dysphagia, no odynophagia, no hoarseness, no tinnitus, no hypoacusis Cardiovascular: + CP, + SOB, no palpitations, + leg swelling Respiratory: no cough, + SOB, no wheezing Gastrointestinal: no N, no V, no D, + C, + acid reflux Musculoskeletal: + Muscle, + joint aches Skin: no rash, no hair loss Neurological: no tremors, no numbness or tingling/no dizziness/+ HAs Psychiatric: no depression, no anxiety  Past Medical History:  Diagnosis Date  . Coronary artery disease   . Diabetes mellitus without complication ([place])   . Hypertension    Past Surgical History:  Procedure Laterality Date  . CORONARY BALLOON ANGIOPLASTY N/A 04/07/2020   Procedure: CORONARY BALLOON ANGIOPLASTY;  Surgeon: Sherren Mocha, MD;  Location: [facility];  Service: Cardiovascular;  Laterality: N/A;  . CORONARY STENT INTERVENTION N/A 04/07/2020   Procedure: CORONARY STENT INTERVENTION;  Surgeon: Sherren Mocha, MD;  Location: [facility];  Service: Cardiovascular;  Laterality: N/A;  . INTRAVASCULAR PRESSURE WIRE/FFR STUDY N/A 04/07/2020   Procedure: INTRAVASCULAR PRESSURE WIRE/FFR  STUDY;  Surgeon: Sherren Mocha, MD;  Location: [facility];  Service: Cardiovascular;  Laterality: N/A;  . INTRAVASCULAR ULTRASOUND/IVUS N/A 04/07/2020   Procedure: Intravascular Ultrasound/IVUS;  Surgeon: Sherren Mocha, MD;  Location: [facility];  Service: Cardiovascular;  Laterality: N/A;  . LEFT HEART CATH AND CORONARY ANGIOGRAPHY N/A 04/07/2020   Procedure: LEFT HEART CATH AND CORONARY ANGIOGRAPHY;   Surgeon: Sherren Mocha, MD;  Location: [facility];  Service: Cardiovascular;  Laterality: N/A;   Social History   Socioeconomic History  . Marital status: Divorced    Spouse name: Not on file  . Number of children: 2  . Years of education: Not on file  . Highest education level: Not on file  Occupational History  . Occupation: On disability  Tobacco Use  . Smoking status: Former Smoker    Quit date: 02/18/2020    Years since quitting: 0.2  . Smokeless tobacco: Never Used  Vaping Use  . Vaping Use: Some days  Substance and Sexual Activity  . Alcohol use: Yes    Comment: SOCIAL  . Drug use: Never  . Sexual activity: Not on file  Other Topics Concern  . Not on file  Social History Narrative  . Not on file   Social Determinants of Health   Financial Resource Strain:   . Difficulty of Paying Living Expenses:   Food Insecurity:   . Worried About Charity fundraiser in the Last Year:   . Arboriculturist in the Last Year:   Transportation Needs:   . Film/video editor (Medical):   Marland Kitchen Lack of Transportation (Non-Medical):   Physical Activity:   . Days of Exercise per Week:   . Minutes of Exercise per Session:   Stress:   . Feeling of Stress :   Social Connections:   . Frequency of Communication with Friends and Family:   . Frequency of Social Gatherings with Friends and Family:   . Attends Religious Services:   . Active Member of Clubs or Organizations:   . Attends Archivist Meetings:   Marland Kitchen Marital Status:   Intimate Partner Violence:   . Fear of Current or Ex-Partner:   . Emotionally Abused:   Marland Kitchen Physically Abused:   . Sexually Abused:    Current Outpatient Medications on File Prior to Visit  Medication Sig Dispense Refill  . albuterol (VENTOLIN HFA) 108 (90 Base) MCG/ACT inhaler Inhale 2 puffs into the lungs every 4 (four) hours as needed for wheezing or shortness of breath.    Marland Kitchen aspirin EC 81 MG tablet Take 81 mg by mouth daily.    . blood  glucose meter kit and supplies Dispense based on patient and insurance preference. Use up to four times daily as directed. (FOR ICD-10 E10.9, E11.9). 1 each 1  . carvedilol (COREG) 3.125 MG tablet Take 3.125 mg by mouth 2 (two) times daily with a meal.    . clopidogrel (PLAVIX) 75 MG tablet Take 75 mg by mouth daily.    . empagliflozin (JARDIANCE) 10 MG TABS tablet Take 10 mg by mouth daily. 30 tablet 11  . Evolocumab (REPATHA SURECLICK) 161 MG/ML SOAJ Inject 1 pen into the skin every 14 (fourteen) days. 2 pen 11  . ezetimibe (ZETIA) 10 MG tablet Take 10 mg by mouth daily.    . famotidine (PEPCID) 20 MG tablet Take 20 mg by mouth 2 (two) times daily.    . furosemide (LASIX) 20 MG tablet Take 2  tablets (40 mg total) by mouth daily. 60 tablet 11  . insulin glargine (LANTUS) 100 UNIT/ML Solostar Pen Inject 8 Units into the skin daily. Further refills by PCP 15 mL 0  . lisinopril (ZESTRIL) 5 MG tablet Take 5 mg by mouth daily.    . Melatonin 10 MG TABS Take 30 mg by mouth at bedtime.     . nitroGLYCERIN (NITROSTAT) 0.4 MG SL tablet Place 0.4 mg under the tongue every 5 (five) minutes as needed for chest pain.    Marland Kitchen OVER THE COUNTER MEDICATION Take 2 tablets by mouth at bedtime. Anxiety and stress relief 2 tablets at night     . RABEprazole (ACIPHEX) 20 MG tablet Take 40 mg by mouth daily.    Marland Kitchen spironolactone (ALDACTONE) 25 MG tablet Take 0.5 tablets (12.5 mg total) by mouth daily. 15 tablet 3   No current facility-administered medications on file prior to visit.   + she has an IUD.  Allergies  Allergen Reactions  . Esomeprazole Magnesium Anaphylaxis    unkn  . Atorvastatin   . Ciprofibrate Itching  . Ciprofloxacin Itching  . Gabapentin     Achy  . Lubiprostone Diarrhea  . Statins     Flu like symptoms  . Amlodipine Rash  . Latex Rash and Swelling  . Other Rash and Swelling   Family History  Problem Relation Age of Onset  . Diabetes Mother   . Hypertension Mother   . Heart disease  Mother   . Hyperlipidemia Mother     PE: BP 118/78   Pulse 75   Ht '5\' 2"'$  (1.575 m)   Wt 169 lb (76.7 kg)   SpO2 99%   BMI 30.91 kg/m  Wt Readings from Last 3 Encounters:  04/20/20 169 lb 3.2 oz (76.7 kg)  04/08/20 170 lb (77.1 kg)  04/01/20 169 lb 6 oz (76.8 kg)   Constitutional: overweight, in NAD Eyes: PERRLA, EOMI, no exophthalmos ENT: moist mucous membranes, no thyromegaly, no cervical lymphadenopathy Cardiovascular: RRR, No MRG Respiratory: CTA B Gastrointestinal: abdomen soft, NT, ND, BS+ Musculoskeletal: no deformities, strength intact in all 4 Skin: moist, warm, no rashes Neurological: no tremor with outstretched hands, DTR normal in all 4  ASSESSMENT: 1. DM2, insulin-dependent, uncontrolled, with complications - CAD-h/o NSTEMI, h/o stent - CHF  2.  Yeast vaginitis  PLAN:  1. Patient with long-standing, uncontrolled diabetes, on oral antidiabetic regimen with SGLT2 inhibitor, recently added, and also low-dose long-acting insulin, which became insufficient.  Latest HbA1c was very high, at 11.0% 1 month ago. -Per review of her previous HbA1c levels, in 2019, by working on her diet and exercise, she was able to reduce her HbA1c to 6% without medications.  She is interested to return to this state.  We discussed that for now, we will need to optimize her diabetes control while she is working on improving her diet and then started decreasing and maybe even stopping her medicines if possible.  We also need to check her insulin production before stopping insulin.  At today's visit, her CBG was 230 so we cannot check a C-peptide.  We will do so at next visit. -We discussed at length about the concept of insulin resistance and I recommended to try to cut down fat out of her diet to improve this and also her cardiovascular status.  She is very young to have had 2 MIs so she definitely need to start making some radical changes to avoid a third.  Discussed about healthier  meals,  given examples, and given written references. -In the meantime, we discussed about optimizing her current regimen by moving Jardiance in the morning (she is now taking it before dinner) and moving Lantus at bedtime.  She is taking a low-dose of Lantus, but for now, we will continue this.  She is also interested in Metformin and we decided to start this today in an effort to transition her off insulin in the near future.  Based on her cardiovascular history, she would be a good candidate for a GLP-1 receptor agonist.  I am planning of adding this at next visits, especially if we are able to stop insulin. - I suggested to:  Patient Instructions  Please chnage: - Jardiance 10 mg before the first meal of the day - Lantus 8 units at bedtime  Please start Metformin 500 mg with dinner x 4 days. If you tolerate this well, add another Metformin tablet (500 mg) with breakfast x 4 days. If you tolerate this well, add another metformin tablet with dinner (total 1000 mg) x 4 days. If you tolerate this well, add another metformin tablet with breakfast (total 1000 mg). Continue with 1000 mg of metformin 2x a day with breakfast and dinner.  Please use Diflucan x 1 tablet for the yeast infection.  Please start Terconazole Cream 3.8%: - insert 1 applicatorful vaginally at bedtime every night for 1 week, then - insert 1/2 applicatorful at bedtime once weekly indefinitely to maintain effect  Please let me know if the sugars are consistently <80 or >200.  Read the following books: Dr. Alyssa Grove - Program for Reversing Diabetes Dr. Karl Luke - Prevent and Reverse Heart Disease Rip Cyd Silence - The Engine 2 diet  Please return in 2 months with your sugar log.   - Strongly advised her to start checking sugars at different times of the day - check 1-2x a day, rotating checks - discussed about CBG targets for treatment: 80-130 mg/dL before meals and <180 mg/dL after meals; target HbA1c <7%. - given sugar  log and advised how to fill it and to bring it at next appt  - given foot care handout and explained the principles  - given instructions for hypoglycemia management "15-15 rule"  - advised for yearly eye exams  - Return to clinic in 2 mo with sugar log   2.  Yeast vaginitis -Likely in the setting of the SGLT2 you return -We will prescribe Diflucan but she also prefers to have something to apply topically so I sent the prescription for terconazole (please see above)  Philemon Kingdom, MD PhD [facility]

## 2020-05-12 NOTE — Telephone Encounter (Signed)
Agreed. She is cleared.

## 2020-05-17 DIAGNOSIS — M549 Dorsalgia, unspecified: Secondary | ICD-10-CM | POA: Diagnosis not present

## 2020-05-17 DIAGNOSIS — I252 Old myocardial infarction: Secondary | ICD-10-CM | POA: Diagnosis not present

## 2020-05-17 DIAGNOSIS — I509 Heart failure, unspecified: Secondary | ICD-10-CM | POA: Diagnosis not present

## 2020-05-17 DIAGNOSIS — Z955 Presence of coronary angioplasty implant and graft: Secondary | ICD-10-CM | POA: Diagnosis not present

## 2020-05-17 DIAGNOSIS — G8929 Other chronic pain: Secondary | ICD-10-CM | POA: Diagnosis not present

## 2020-05-17 DIAGNOSIS — I110 Hypertensive heart disease with heart failure: Secondary | ICD-10-CM | POA: Diagnosis not present

## 2020-05-17 DIAGNOSIS — E119 Type 2 diabetes mellitus without complications: Secondary | ICD-10-CM | POA: Diagnosis not present

## 2020-05-17 NOTE — Telephone Encounter (Signed)
Clearance faxed

## 2020-05-17 NOTE — Telephone Encounter (Signed)
Spoke with [facility]. Representative stated that on 6/16 the foundation sent the patient a one time 90 day fill of Jardiance. The foundation requires that patients apply for and be denied LIS before approval.  Called patient, no way to leave message at the time. Will follow up.

## 2020-05-18 ENCOUNTER — Telehealth (HOSPITAL_COMMUNITY): Payer: Self-pay | Admitting: Licensed Clinical Social Worker

## 2020-05-18 NOTE — Telephone Encounter (Signed)
CSW consulted to assist with LIS/Extra Help application- [employer] will not approve pt for assistance with Jardiance until she receives a denial from LIS.  CSW assisted pt in applying over the phone and informed her she should get a letter from [facility] in 3-4 weeks with her eligibility.  Informed pt she should call us when she receives this letter so we can proceed from there  Pt also inquired about potential help with her outstanding hospital bills from [facility] and [facility].  CSW explained that there are no hospital programs that can assist patients with insurance but encouraged her to call billing to establish a payment plan that is reasonable so her bills don't go to collections.  Also encouraged pt to call [place] DSS to speak with representative regarding eligibility for high deductible Medicaid- provided pt with number to call.  Will continue to follow and assist as needed  Burna Sis, LCSW Clinical Social Worker [facility] Desk#: [phone] Cell#: [phone]

## 2020-05-20 DIAGNOSIS — M549 Dorsalgia, unspecified: Secondary | ICD-10-CM | POA: Diagnosis not present

## 2020-05-20 DIAGNOSIS — I110 Hypertensive heart disease with heart failure: Secondary | ICD-10-CM | POA: Diagnosis not present

## 2020-05-20 DIAGNOSIS — I252 Old myocardial infarction: Secondary | ICD-10-CM | POA: Diagnosis not present

## 2020-05-20 DIAGNOSIS — Z955 Presence of coronary angioplasty implant and graft: Secondary | ICD-10-CM | POA: Diagnosis not present

## 2020-05-20 DIAGNOSIS — G8929 Other chronic pain: Secondary | ICD-10-CM | POA: Diagnosis not present

## 2020-05-20 DIAGNOSIS — E119 Type 2 diabetes mellitus without complications: Secondary | ICD-10-CM | POA: Diagnosis not present

## 2020-05-20 DIAGNOSIS — I509 Heart failure, unspecified: Secondary | ICD-10-CM | POA: Diagnosis not present

## 2020-05-23 DIAGNOSIS — R002 Palpitations: Secondary | ICD-10-CM | POA: Diagnosis not present

## 2020-05-25 NOTE — Addendum Note (Signed)
Encounter addended by: Crissie Figures, RN on: 05/25/2020 10:33 AM  Actions taken: Imaging Exam ended

## 2020-05-31 ENCOUNTER — Telehealth (HOSPITAL_COMMUNITY): Payer: Self-pay | Admitting: *Deleted

## 2020-05-31 NOTE — Telephone Encounter (Signed)
Pt called the front desk to confirm her appointment for next week and was told she did not have an appointment here next week pt also wanted zio monitor results. Pt was then transferred to me. Pt stated she was very upset because she was given an appt date but now we are telling her she does not have an appointment. I apologized to patient about the appointment and offered her next available. Pt allowed me to schedule her an appointment but again was verbally upset and stated someone dropped the ball whoever checked her out and told her a date but did not actually make the appointment. I tried to inform her I would ask Dr.Bensimhon about her zio report results but he is on vacation and has not sent results out for clinic staff to read I told her I would also see if I could work her in after I speak with the provider but patient did not allow me to finish my sentence and hung the phone up.

## 2020-06-06 ENCOUNTER — Other Ambulatory Visit: Payer: PPO

## 2020-06-07 DIAGNOSIS — I10 Essential (primary) hypertension: Secondary | ICD-10-CM | POA: Diagnosis not present

## 2020-06-07 DIAGNOSIS — E1165 Type 2 diabetes mellitus with hyperglycemia: Secondary | ICD-10-CM | POA: Diagnosis not present

## 2020-06-07 DIAGNOSIS — F419 Anxiety disorder, unspecified: Secondary | ICD-10-CM | POA: Diagnosis not present

## 2020-06-07 DIAGNOSIS — E782 Mixed hyperlipidemia: Secondary | ICD-10-CM | POA: Diagnosis not present

## 2020-06-09 ENCOUNTER — Telehealth (HOSPITAL_COMMUNITY): Payer: Self-pay | Admitting: Licensed Clinical Social Worker

## 2020-06-09 ENCOUNTER — Other Ambulatory Visit: Payer: PPO

## 2020-06-09 NOTE — Telephone Encounter (Signed)
CSW attempted to call pt to check in regarding Extra Help application status- unable to reach and unable to leave a VM- will attempt again later  Burna Sis, LCSW Clinical Social Worker [facility] Desk#: [phone] Cell#: [phone]

## 2020-06-13 ENCOUNTER — Telehealth (HOSPITAL_COMMUNITY): Payer: Self-pay | Admitting: Licensed Clinical Social Worker

## 2020-06-13 NOTE — Telephone Encounter (Signed)
CSW attempted to call pt to check in regarding Extra Help application status- phone went to error message and as unable to leave VM.  CSW sent pt an email inquiring about application status and requesting a return call  Will continue to follow and assist as needed  Burna Sis, LCSW Clinical Social Worker [facility] Desk#: [phone] Cell#: [phone]

## 2020-06-20 DIAGNOSIS — I252 Old myocardial infarction: Secondary | ICD-10-CM | POA: Diagnosis not present

## 2020-06-20 DIAGNOSIS — I509 Heart failure, unspecified: Secondary | ICD-10-CM | POA: Diagnosis not present

## 2020-06-20 DIAGNOSIS — G8929 Other chronic pain: Secondary | ICD-10-CM | POA: Diagnosis not present

## 2020-06-20 DIAGNOSIS — E119 Type 2 diabetes mellitus without complications: Secondary | ICD-10-CM | POA: Diagnosis not present

## 2020-06-20 DIAGNOSIS — M549 Dorsalgia, unspecified: Secondary | ICD-10-CM | POA: Diagnosis not present

## 2020-06-20 DIAGNOSIS — Z955 Presence of coronary angioplasty implant and graft: Secondary | ICD-10-CM | POA: Diagnosis not present

## 2020-06-20 DIAGNOSIS — I110 Hypertensive heart disease with heart failure: Secondary | ICD-10-CM | POA: Diagnosis not present

## 2020-06-20 NOTE — Telephone Encounter (Signed)
Spoke with patient this morning. She has not received any news yet on the status of her LIS application. She informed me that sometimes, she does not get her mail in a timely manner. I am going to check back in with her next week to see if she received anything. We may call social security if need be to check the application status.

## 2020-06-21 ENCOUNTER — Other Ambulatory Visit: Payer: PPO

## 2020-06-21 ENCOUNTER — Other Ambulatory Visit: Payer: Self-pay

## 2020-06-21 DIAGNOSIS — E782 Mixed hyperlipidemia: Secondary | ICD-10-CM

## 2020-06-21 LAB — LIPID PANEL
Chol/HDL Ratio: 3.4 ratio (ref 0.0–4.4)
Cholesterol, Total: 113 mg/dL (ref 100–199)
HDL: 33 mg/dL — ABNORMAL LOW (ref >39)
LDL Chol Calc (NIH): 54 mg/dL (ref 0–99)
Triglycerides: 153 mg/dL — ABNORMAL HIGH (ref 0–149)
VLDL Cholesterol Cal: 26 mg/dL (ref 5–40)

## 2020-06-21 LAB — HEPATIC FUNCTION PANEL
ALT: 13 IU/L (ref 0–32)
AST: 11 IU/L (ref 0–40)
Albumin: 4.2 g/dL (ref 3.8–4.8)
Alkaline Phosphatase: 86 IU/L (ref 48–121)
Bilirubin Total: <0.2 mg/dL (ref 0.0–1.2)
Bilirubin, Direct: 0.05 mg/dL (ref 0.00–0.40)
Total Protein: 6.2 g/dL (ref 6.0–8.5)

## 2020-07-04 NOTE — Telephone Encounter (Signed)
Spoke with patient today. She is going to call and check the status of her LIS application since she has not heard anything via mail. She is going to call me back afterwards.

## 2020-07-18 DIAGNOSIS — E1165 Type 2 diabetes mellitus with hyperglycemia: Secondary | ICD-10-CM | POA: Diagnosis not present

## 2020-07-18 DIAGNOSIS — I10 Essential (primary) hypertension: Secondary | ICD-10-CM | POA: Diagnosis not present

## 2020-07-18 DIAGNOSIS — E782 Mixed hyperlipidemia: Secondary | ICD-10-CM | POA: Diagnosis not present

## 2020-07-18 DIAGNOSIS — F419 Anxiety disorder, unspecified: Secondary | ICD-10-CM | POA: Diagnosis not present

## 2020-07-20 ENCOUNTER — Other Ambulatory Visit: Payer: Self-pay | Admitting: Cardiovascular Disease

## 2020-07-20 DIAGNOSIS — E119 Type 2 diabetes mellitus without complications: Secondary | ICD-10-CM | POA: Diagnosis not present

## 2020-07-20 DIAGNOSIS — Z955 Presence of coronary angioplasty implant and graft: Secondary | ICD-10-CM | POA: Diagnosis not present

## 2020-07-21 ENCOUNTER — Telehealth: Payer: Self-pay | Admitting: Cardiovascular Disease

## 2020-07-21 NOTE — Telephone Encounter (Signed)
Dr. Gala Romney is the prescribing provider. Pt has never seen Dr. Excell Seltzer.  carvedilol (COREG) 3.125 MG tablet Medication Date: 07/21/2020 Department: [facility] [address] Office Ordering/Authorizing: Bensimhon, Bevelyn Buckles, MD  Order Providers  Prescribing Provider Encounter Provider  Bensimhon, Bevelyn Buckles, MD Tonny Bollman, MD

## 2020-07-21 NOTE — Telephone Encounter (Signed)
    Pt c/o medication issue:  1. Name of Medication:   Evolocumab (REPATHA SURECLICK) 140 MG/ML SOAJ     2. How are you currently taking this medication (dosage and times per day)? Inject 1 pen into the skin every 14 (fourteen) days.  3. Are you having a reaction (difficulty breathing--STAT)?   4. What is your medication issue? Julie Hernandez solition would like to get prior auth for repatha, she gave fax# [phone]

## 2020-07-21 NOTE — Telephone Encounter (Signed)
Faxed back documents for PA

## 2020-07-22 NOTE — Telephone Encounter (Signed)
[place] APPROVED TIL 07/21/21

## 2020-07-25 NOTE — Progress Notes (Signed)
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  Had anterior MI in 2010 taken emergently to [facility]. Stent was placed emergently. Post stenting she continued to have CP. Went to [facility] that same year and had a a second stent placed. Did well from a cardiac perspective until 3/21. Reports she has failed all statins due to intolerance. Has been on Zetia. Also has DM2 and says she previously was on insulin but now controls it with diet.   Had cath 02/08/20 at [facility] for NSTEMI. 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 Was told she may need repeat stenting vs CABG. Referred here for second opinion.   We saw her in 5/21 with daily angina. Underwent MRI which showed only minimal viability in anterior wall. Decision to proceed with PCI of LCX and LAD (ISR) over CABG. Had successful PCI of LCX and LAD with Dr. Burt Knack on 5/20.   Zio placed at last visit due to palpitations (7/31) 1. Sinus rhythm - avg HR of 82 2. 13 Ventricular Tachycardia runs occurred, the run with the fastest interval lasting 12 beats with a max rate of 139 bpm (avg 110 bpm); the run with the fastest interval was also the longest 3. Very frequents PVCs (32.2%, [id number]), VE Couplets were rare (<1.0%, 2118),  4. Ventricular Bigeminy and Trigeminy were present. 5.. Multiple patient-triggered events associated   Taking lasix $RemoveBefo'40mg'BtAnFwAXSRP$  daily for edema. Says if she misses a dose she gets bloated. Also referred to Lipid clinic and started on  Repatha/Zetia.   Here for routine f/u. Feels a lot better. Going to CR and doing well. Breathing much better and gaining strength. No CP in CR but has occasional CP. No edema, orthopnea or PND. BP stays low. Says SBP can go as low as 80. No dizziness or presyncope. Stopped smoking cigarettes. Still using a vape pen daily.   Studies:  cMRI 03/29/20 1. Subendocardial late gadolinium enhancement consistent with prior infarct in the LAD territory. There is >50% transmural LGE suggesting nonviability in the basal to apical anterior wall and apex. There is <50% transmural LGE suggesting viability in the basal to mid anteroseptum and apical septum  2.  LV apical thrombus measuring 47mm x 68mm  3. Normal LV size with severe systolic dysfunction (EF 13%). Akinesis of basal to apical anterior/anteroseptal walls and apex  4.  Small RV size with normal systolic function (EF 24%)   Past Medical History:  Diagnosis Date  . Coronary artery disease   . Diabetes mellitus without complication ([place])   . Hypertension     Current Outpatient Medications  Medication Sig Dispense Refill  . albuterol (VENTOLIN HFA) 108 (90 Base) MCG/ACT inhaler Inhale 2 puffs into the lungs every 4 (four) hours as needed for wheezing or shortness of breath.    Marland Kitchen aspirin EC 81 MG tablet Take 81 mg by mouth daily.    . blood glucose meter kit and supplies Dispense based on patient and insurance preference. Use  up to four times daily as directed. (FOR ICD-10 E10.9, E11.9). 1 each 1  . carvedilol (COREG) 3.125 MG tablet Take 1 tablet (3.125 mg total) by mouth 2 times daily with meals. 60 tablet 0  . clopidogrel (PLAVIX) 75 MG tablet Take 75 mg by mouth daily.    . empagliflozin (JARDIANCE) 10 MG TABS tablet Take 10 mg by mouth daily. 30 tablet 11  . Evolocumab (REPATHA SURECLICK) 092 MG/ML SOAJ Inject 1 pen into the skin every 14 (fourteen) days. 2 pen 11  . ezetimibe (ZETIA) 10 MG tablet Take 1 tablet (10 mg total) by mouth  daily at 6pm. 30 tablet 0  . famotidine (PEPCID) 20 MG tablet Take 20 mg by mouth 2 (two) times daily.    . furosemide (LASIX) 20 MG tablet Take 2 tablets (40 mg total) by mouth daily. 60 tablet 11  . insulin glargine (LANTUS) 100 UNIT/ML Solostar Pen Inject 8 Units into the skin daily. Further refills by PCP 15 mL 0  . lisinopril (ZESTRIL) 5 MG tablet Take 5 mg by mouth daily.    . metFORMIN (GLUCOPHAGE) 500 MG tablet Take 2 tablets (1,000 mg total) by mouth 2 (two) times daily with a meal. 360 tablet 3  . nitroGLYCERIN (NITROSTAT) 0.4 MG SL tablet Place 0.4 mg under the tongue every 5 (five) minutes as needed for chest pain.    . RABEprazole (ACIPHEX) 20 MG tablet Take 40 mg by mouth daily.    Marland Kitchen spironolactone (ALDACTONE) 25 MG tablet Take 0.5 tablets (12.5 mg total) by mouth daily. 15 tablet 3   No current facility-administered medications for this encounter.    Allergies  Allergen Reactions  . Esomeprazole Magnesium Anaphylaxis    unkn  . Atorvastatin   . Ciprofibrate Itching  . Ciprofloxacin Itching  . Gabapentin     Achy  . Lubiprostone Diarrhea  . Statins     Flu like symptoms  . Amlodipine Rash  . Latex Rash and Swelling  . Other Rash and Swelling      Social History   Socioeconomic History  . Marital status: Divorced    Spouse name: Not on file  . Number of children: 2  . Years of education: Not on file  . Highest education level: Not on file  Occupational History  . Occupation: On disability  Tobacco Use  . Smoking status: Former Smoker    Quit date: 02/18/2020    Years since quitting: 0.4  . Smokeless tobacco: Never Used  Vaping Use  . Vaping Use: Some days  Substance and Sexual Activity  . Alcohol use: Yes    Comment: SOCIAL  . Drug use: Never  . Sexual activity: Not on file  Other Topics Concern  . Not on file  Social History Narrative  . Not on file   Social Determinants of Health   Financial Resource Strain:   . Difficulty of Paying Living  Expenses: Not on file  Food Insecurity:   . Worried About Charity fundraiser in the Last Year: Not on file  . Ran Out of Food in the Last Year: Not on file  Transportation Needs:   . Lack of Transportation (Medical): Not on file  . Lack of Transportation (Non-Medical): Not on file  Physical Activity:   . Days of Exercise per Week: Not on file  . Minutes of Exercise per Session: Not on file  Stress:   . Feeling of Stress : Not on file  Social Connections:   .  Frequency of Communication with Friends and Family: Not on file  . Frequency of Social Gatherings with Friends and Family: Not on file  . Attends Religious Services: Not on file  . Active Member of Clubs or Organizations: Not on file  . Attends Archivist Meetings: Not on file  . Marital Status: Not on file  Intimate Partner Violence:   . Fear of Current or Ex-Partner: Not on file  . Emotionally Abused: Not on file  . Physically Abused: Not on file  . Sexually Abused: Not on file    FHx:  M died at 69 from MI and HF D had stroke No brother and sisters  Vitals:   07/26/20 1125  BP: 100/70  Pulse: 86  SpO2: 100%  Weight: 78.6 kg (173 lb 3.2 oz)    PHYSICAL EXAM: General:  Well appearing. No resp difficulty HEENT: normal Neck: supple. no JVD. Carotids 2+ bilat; no bruits. No lymphadenopathy or thryomegaly appreciated. Cor: PMI nondisplaced. Regular rate & rhythm. No rubs, gallops or murmurs. Lungs: clear Abdomen: obese soft, nontender, nondistended. No hepatosplenomegaly. No bruits or masses. Good bowel sounds. Extremities: no cyanosis, clubbing, rash, edema Neuro: alert & orientedx3, cranial nerves grossly intact. moves all 4 extremities w/o difficulty. Affect pleasant   ECG: NSR 81 Anteroseptal Qs. No PVCs Personally reviewed   ASSESSMENT & PLAN:  1. CAD - s/p PCI of LCX and LAD (ISR) in 5/21. 50% mRCA lesion  - Doing well with CR in [place]. Occasional mild CP but does not appear anginal.  -  Continue ASA/Plavix - She has been intolerant of all statin. Recent LDL 54 on Repatha/Zetia. Follows with Lipid Clinic -  At f/u can consider adding Eliquis for LV clot and drop ASA  2. 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  - Improved NYHA II - Continue carvedilol 3.125 bid, Jardiance, spiro 12.5 and lisinopril 5. (BP too low for Entresto) - Will repeat echo. If EF <= 35% will need referral for ICD - this was dicussed - Unclear if PVCs contributing to LV dysfunction (see below)  3. LV apical clot - as above.  - Clot likely laminated at this point so less urgent  4. Hyperlipidemia - Intolerant of all statins. On repatha/zetia  - last LDL 54 8/21  5. DM2 - HgBa1c previously 11.0  - On Jardiance.  - Following with Dr. Monna Fam.  6. Tobacco use - off cigarettes but now vaping. - encouragcessation  7. Frequent PVCs - Zio 7/31 with 32% PVCs. Unclear if this is affecting LV function.  - No PVCs on exam or ECG - Have reached out to CR in [place] to try to get tele strips from them -> tele strips obtained. Only 1 PVC over multiple strips.  - Will continue to follow. Can place another zio as needed.   Glori Bickers, MD  11:50 AM

## 2020-07-26 ENCOUNTER — Other Ambulatory Visit: Payer: Self-pay

## 2020-07-26 ENCOUNTER — Ambulatory Visit (HOSPITAL_COMMUNITY)
Admission: RE | Admit: 2020-07-26 | Discharge: 2020-07-26 | Disposition: A | Discharge status: Home / Self Care | Payer: PPO | Source: Ambulatory Visit | Attending: Internal Medicine | Admitting: Internal Medicine

## 2020-07-26 VITALS — BP 100/70 | HR 86 | Wt 173.2 lb

## 2020-07-26 DIAGNOSIS — I493 Ventricular premature depolarization: Secondary | ICD-10-CM | POA: Insufficient documentation

## 2020-07-26 DIAGNOSIS — J449 Chronic obstructive pulmonary disease, unspecified: Secondary | ICD-10-CM | POA: Diagnosis not present

## 2020-07-26 DIAGNOSIS — Z8249 Family history of ischemic heart disease and other diseases of the circulatory system: Secondary | ICD-10-CM | POA: Insufficient documentation

## 2020-07-26 DIAGNOSIS — F17290 Nicotine dependence, other tobacco product, uncomplicated: Secondary | ICD-10-CM | POA: Insufficient documentation

## 2020-07-26 DIAGNOSIS — Z888 Allergy status to other drugs, medicaments and biological substances status: Secondary | ICD-10-CM | POA: Insufficient documentation

## 2020-07-26 DIAGNOSIS — I110 Hypertensive heart disease with heart failure: Secondary | ICD-10-CM | POA: Diagnosis not present

## 2020-07-26 DIAGNOSIS — I252 Old myocardial infarction: Secondary | ICD-10-CM | POA: Diagnosis not present

## 2020-07-26 DIAGNOSIS — I25118 Atherosclerotic heart disease of native coronary artery with other forms of angina pectoris: Secondary | ICD-10-CM

## 2020-07-26 DIAGNOSIS — E785 Hyperlipidemia, unspecified: Secondary | ICD-10-CM | POA: Diagnosis not present

## 2020-07-26 DIAGNOSIS — Z794 Long term (current) use of insulin: Secondary | ICD-10-CM | POA: Diagnosis not present

## 2020-07-26 DIAGNOSIS — I513 Intracardiac thrombosis, not elsewhere classified: Secondary | ICD-10-CM | POA: Diagnosis not present

## 2020-07-26 DIAGNOSIS — Z7982 Long term (current) use of aspirin: Secondary | ICD-10-CM | POA: Diagnosis not present

## 2020-07-26 DIAGNOSIS — Z79899 Other long term (current) drug therapy: Secondary | ICD-10-CM | POA: Insufficient documentation

## 2020-07-26 DIAGNOSIS — I240 Acute coronary thrombosis not resulting in myocardial infarction: Secondary | ICD-10-CM | POA: Insufficient documentation

## 2020-07-26 DIAGNOSIS — Z955 Presence of coronary angioplasty implant and graft: Secondary | ICD-10-CM | POA: Diagnosis not present

## 2020-07-26 DIAGNOSIS — I5022 Chronic systolic (congestive) heart failure: Secondary | ICD-10-CM | POA: Diagnosis not present

## 2020-07-26 DIAGNOSIS — E119 Type 2 diabetes mellitus without complications: Secondary | ICD-10-CM | POA: Insufficient documentation

## 2020-07-26 DIAGNOSIS — I2510 Atherosclerotic heart disease of native coronary artery without angina pectoris: Secondary | ICD-10-CM | POA: Insufficient documentation

## 2020-07-26 DIAGNOSIS — Z7902 Long term (current) use of antithrombotics/antiplatelets: Secondary | ICD-10-CM | POA: Diagnosis not present

## 2020-07-26 DIAGNOSIS — R079 Chest pain, unspecified: Secondary | ICD-10-CM | POA: Diagnosis not present

## 2020-07-26 LAB — BASIC METABOLIC PANEL
Anion gap: 8 (ref 5–15)
BUN: 16 mg/dL (ref 6–20)
CO2: 23 mmol/L (ref 22–32)
Calcium: 9.5 mg/dL (ref 8.9–10.3)
Chloride: 109 mmol/L (ref 98–111)
Creatinine, Ser: 1.03 mg/dL — ABNORMAL HIGH (ref 0.44–1.00)
GFR calc Af Amer: >60 mL/min (ref >60)
GFR calc non Af Amer: >60 mL/min (ref >60)
Glucose, Bld: 174 mg/dL — ABNORMAL HIGH (ref 70–99)
Potassium: 4.6 mmol/L (ref 3.5–5.1)
Sodium: 140 mmol/L (ref 135–145)

## 2020-07-26 LAB — MAGNESIUM: Magnesium: 1.9 mg/dL (ref 1.7–2.4)

## 2020-07-26 LAB — BRAIN NATRIURETIC PEPTIDE: B Natriuretic Peptide: 62.7 pg/mL (ref 0.0–100.0)

## 2020-07-26 MED ORDER — FUROSEMIDE 20 MG PO TABS
40.0000 mg | ORAL_TABLET | ORAL | 11 refills | Status: DC
Start: 2020-07-26 — End: 2022-09-20

## 2020-07-26 NOTE — Patient Instructions (Addendum)
Change Furosemide to only every other day   Labs done today, your results will be available in MyChart, we will contact you for abnormal readings.  Your physician has requested that you have an echocardiogram. Echocardiography is a painless test that uses sound waves to create images of your heart. It provides your doctor with information about the size and shape of your heart and how well your hearts chambers and valves are working. This procedure takes approximately one hour. There are no restrictions for this procedure.  Your physician has requested that you have an echocardiogram. Echocardiography is a painless test that uses sound waves to create images of your heart. It provides your doctor with information about the size and shape of your heart and how well your hearts chambers and valves are working. This procedure takes approximately one hour. There are no restrictions for this procedure.  If you have any questions or concerns before your next appointment please send [place] a message through [place] or call our office at [phone].    TO LEAVE A MESSAGE FOR THE NURSE SELECT OPTION 2, PLEASE LEAVE A MESSAGE INCLUDING:  YOUR NAME  DATE OF BIRTH  CALL BACK NUMBER  REASON FOR CALL**this is important as we prioritize the call backs  YOU WILL RECEIVE A CALL BACK THE SAME DAY AS LONG AS YOU CALL BEFORE 4:00 PM  At the [facility], you and your health needs are our priority. As part of our continuing mission to provide you with exceptional heart care, we have created designated Provider Care Teams. These Care Teams include your primary Cardiologist (physician) and Advanced Practice Providers (APPs- Physician Assistants and Nurse Practitioners) who all work together to provide you with the care you need, when you need it.   You may see any of the following providers on your designated Care Team at your next follow up:  Dr Arvilla Meres  Dr Carron Curie,  NP  Robbie Lis, [name]  Karle Plumber, [name]   Please be sure to bring in all your medications bottles to every appointment.

## 2020-07-29 ENCOUNTER — Ambulatory Visit (HOSPITAL_COMMUNITY)
Admission: RE | Admit: 2020-07-29 | Discharge: 2020-07-29 | Disposition: A | Discharge status: Home / Self Care | Payer: PPO | Source: Ambulatory Visit | Attending: Internal Medicine | Admitting: Internal Medicine

## 2020-07-29 ENCOUNTER — Other Ambulatory Visit: Payer: Self-pay

## 2020-07-29 DIAGNOSIS — E119 Type 2 diabetes mellitus without complications: Secondary | ICD-10-CM | POA: Diagnosis not present

## 2020-07-29 DIAGNOSIS — I110 Hypertensive heart disease with heart failure: Secondary | ICD-10-CM | POA: Diagnosis not present

## 2020-07-29 DIAGNOSIS — Z87891 Personal history of nicotine dependence: Secondary | ICD-10-CM | POA: Insufficient documentation

## 2020-07-29 DIAGNOSIS — I5022 Chronic systolic (congestive) heart failure: Secondary | ICD-10-CM | POA: Insufficient documentation

## 2020-07-29 LAB — ECHOCARDIOGRAM COMPLETE
Area-P 1/2: 3.72 cm2
S' Lateral: 2.7 cm

## 2020-07-29 NOTE — Progress Notes (Signed)
  Echocardiogram 2D Echocardiogram has been performed.  Celene Skeen 07/29/2020, 11:54 AM

## 2020-08-01 ENCOUNTER — Telehealth (HOSPITAL_COMMUNITY): Payer: Self-pay

## 2020-08-01 NOTE — Telephone Encounter (Signed)
-----   Message from Dolores Patty, MD sent at 07/31/2020  2:45 PM EDT ----- EF has normalized. This is great.

## 2020-08-01 NOTE — Telephone Encounter (Signed)
Samara Snide, RN  08/01/2020 2:12 PM EDT Back to Top    Patient advised and verbalized understanding

## 2020-08-15 ENCOUNTER — Ambulatory Visit: Payer: PPO | Admitting: Internal Medicine

## 2020-08-16 DIAGNOSIS — G43909 Migraine, unspecified, not intractable, without status migrainosus: Secondary | ICD-10-CM | POA: Diagnosis not present

## 2020-08-16 DIAGNOSIS — G629 Polyneuropathy, unspecified: Secondary | ICD-10-CM | POA: Diagnosis not present

## 2020-08-16 DIAGNOSIS — K5901 Slow transit constipation: Secondary | ICD-10-CM | POA: Diagnosis not present

## 2020-08-16 DIAGNOSIS — F419 Anxiety disorder, unspecified: Secondary | ICD-10-CM | POA: Diagnosis not present

## 2020-08-19 ENCOUNTER — Telehealth (HOSPITAL_COMMUNITY): Payer: Self-pay | Admitting: Licensed Clinical Social Worker

## 2020-08-19 NOTE — Telephone Encounter (Signed)
Pt called this morning and stated that she was out of jardiance and needed assistance getting more.  Had applied for [facility] but was denied until she got determination from [facility]- pt states she never got a letter from [facility] with determination.  CSW able to give pt samples for 1 month while she calls [employer] to request letter be faxed to the clinic.  Will continue to follow and assist as needed  Burna Sis, LCSW Clinical Social Worker [facility] Desk#: [phone] Cell#: [phone]

## 2020-08-22 DIAGNOSIS — I252 Old myocardial infarction: Secondary | ICD-10-CM | POA: Diagnosis not present

## 2020-08-22 DIAGNOSIS — E119 Type 2 diabetes mellitus without complications: Secondary | ICD-10-CM | POA: Diagnosis not present

## 2020-08-22 DIAGNOSIS — M549 Dorsalgia, unspecified: Secondary | ICD-10-CM | POA: Diagnosis not present

## 2020-08-22 DIAGNOSIS — Z955 Presence of coronary angioplasty implant and graft: Secondary | ICD-10-CM | POA: Diagnosis not present

## 2020-08-22 DIAGNOSIS — I110 Hypertensive heart disease with heart failure: Secondary | ICD-10-CM | POA: Diagnosis not present

## 2020-08-22 DIAGNOSIS — G8929 Other chronic pain: Secondary | ICD-10-CM | POA: Diagnosis not present

## 2020-08-22 DIAGNOSIS — I509 Heart failure, unspecified: Secondary | ICD-10-CM | POA: Diagnosis not present

## 2020-08-24 ENCOUNTER — Other Ambulatory Visit: Payer: Self-pay | Admitting: Internal Medicine

## 2020-09-03 DIAGNOSIS — G43909 Migraine, unspecified, not intractable, without status migrainosus: Secondary | ICD-10-CM | POA: Diagnosis not present

## 2020-09-03 DIAGNOSIS — K5901 Slow transit constipation: Secondary | ICD-10-CM | POA: Diagnosis not present

## 2020-09-03 DIAGNOSIS — G629 Polyneuropathy, unspecified: Secondary | ICD-10-CM | POA: Diagnosis not present

## 2020-09-03 DIAGNOSIS — F419 Anxiety disorder, unspecified: Secondary | ICD-10-CM | POA: Diagnosis not present

## 2020-09-11 ENCOUNTER — Other Ambulatory Visit (HOSPITAL_COMMUNITY): Payer: Self-pay | Admitting: Internal Medicine

## 2020-09-13 ENCOUNTER — Other Ambulatory Visit (HOSPITAL_COMMUNITY): Payer: Self-pay | Admitting: Internal Medicine

## 2020-09-28 ENCOUNTER — Ambulatory Visit (HOSPITAL_COMMUNITY)
Admission: RE | Admit: 2020-09-28 | Discharge: 2020-09-28 | Disposition: A | Discharge status: Home / Self Care | Payer: PPO | Source: Ambulatory Visit | Attending: Internal Medicine | Admitting: Internal Medicine

## 2020-09-28 ENCOUNTER — Encounter (HOSPITAL_COMMUNITY): Payer: Self-pay | Admitting: Internal Medicine

## 2020-09-28 ENCOUNTER — Other Ambulatory Visit: Payer: Self-pay

## 2020-09-28 VITALS — BP 132/82 | HR 74 | Wt 175.8 lb

## 2020-09-28 DIAGNOSIS — E119 Type 2 diabetes mellitus without complications: Secondary | ICD-10-CM | POA: Diagnosis not present

## 2020-09-28 DIAGNOSIS — I2510 Atherosclerotic heart disease of native coronary artery without angina pectoris: Secondary | ICD-10-CM | POA: Diagnosis not present

## 2020-09-28 DIAGNOSIS — Z7902 Long term (current) use of antithrombotics/antiplatelets: Secondary | ICD-10-CM | POA: Insufficient documentation

## 2020-09-28 DIAGNOSIS — Z794 Long term (current) use of insulin: Secondary | ICD-10-CM | POA: Insufficient documentation

## 2020-09-28 DIAGNOSIS — Z720 Tobacco use: Secondary | ICD-10-CM | POA: Insufficient documentation

## 2020-09-28 DIAGNOSIS — R002 Palpitations: Secondary | ICD-10-CM | POA: Insufficient documentation

## 2020-09-28 DIAGNOSIS — Z8249 Family history of ischemic heart disease and other diseases of the circulatory system: Secondary | ICD-10-CM | POA: Insufficient documentation

## 2020-09-28 DIAGNOSIS — E785 Hyperlipidemia, unspecified: Secondary | ICD-10-CM | POA: Insufficient documentation

## 2020-09-28 DIAGNOSIS — Z79899 Other long term (current) drug therapy: Secondary | ICD-10-CM | POA: Insufficient documentation

## 2020-09-28 DIAGNOSIS — I5022 Chronic systolic (congestive) heart failure: Secondary | ICD-10-CM | POA: Insufficient documentation

## 2020-09-28 DIAGNOSIS — I493 Ventricular premature depolarization: Secondary | ICD-10-CM

## 2020-09-28 DIAGNOSIS — I252 Old myocardial infarction: Secondary | ICD-10-CM | POA: Diagnosis not present

## 2020-09-28 DIAGNOSIS — I110 Hypertensive heart disease with heart failure: Secondary | ICD-10-CM | POA: Insufficient documentation

## 2020-09-28 DIAGNOSIS — Z7982 Long term (current) use of aspirin: Secondary | ICD-10-CM | POA: Diagnosis not present

## 2020-09-28 DIAGNOSIS — M79606 Pain in leg, unspecified: Secondary | ICD-10-CM | POA: Diagnosis not present

## 2020-09-28 DIAGNOSIS — G8929 Other chronic pain: Secondary | ICD-10-CM | POA: Insufficient documentation

## 2020-09-28 DIAGNOSIS — Z955 Presence of coronary angioplasty implant and graft: Secondary | ICD-10-CM | POA: Insufficient documentation

## 2020-09-28 DIAGNOSIS — J449 Chronic obstructive pulmonary disease, unspecified: Secondary | ICD-10-CM | POA: Diagnosis not present

## 2020-09-28 HISTORY — DX: Heart failure, unspecified: I50.9

## 2020-09-28 LAB — BASIC METABOLIC PANEL WITH GFR
Anion gap: 12 (ref 5–15)
BUN: 13 mg/dL (ref 6–20)
CO2: 24 mmol/L (ref 22–32)
Calcium: 9.9 mg/dL (ref 8.9–10.3)
Chloride: 105 mmol/L (ref 98–111)
Creatinine, Ser: 0.91 mg/dL (ref 0.44–1.00)
GFR, Estimated: >60 mL/min
Glucose, Bld: 158 mg/dL — ABNORMAL HIGH (ref 70–99)
Potassium: 4.8 mmol/L (ref 3.5–5.1)
Sodium: 141 mmol/L (ref 135–145)

## 2020-09-28 LAB — MAGNESIUM: Magnesium: 2.2 mg/dL (ref 1.7–2.4)

## 2020-09-28 MED ORDER — CARVEDILOL 6.25 MG PO TABS
6.2500 mg | ORAL_TABLET | Freq: Two times a day (BID) | ORAL | 3 refills | Status: DC
Start: 2020-09-28 — End: 2022-09-17

## 2020-09-28 NOTE — Patient Instructions (Addendum)
Increase Carvedilol to 6.25 mg 2 times a day.  Labs done today, your results will be available in MyChart, we will contact you for abnormal readings.  Consider Alive Cor.  .Your physician recommends that you schedule a follow-up appointment in: 3 months.  If you have any questions or concerns before your next appointment please send [place] a message through [place] or call our office at [phone].    TO LEAVE A MESSAGE FOR THE NURSE SELECT OPTION 2, PLEASE LEAVE A MESSAGE INCLUDING: . YOUR NAME . DATE OF BIRTH . CALL BACK NUMBER . REASON FOR CALL**this is important as we prioritize the call backs  YOU WILL RECEIVE A CALL BACK THE SAME DAY AS LONG AS YOU CALL BEFORE 4:00 PM   At the [facility], you and your health needs are our priority. As part of our continuing mission to provide you with exceptional heart care, we have created designated Provider Care Teams. These Care Teams include your primary Cardiologist (physician) and Advanced Practice Providers (APPs- Physician Assistants and Nurse Practitioners) who all work together to provide you with the care you need, when you need it.   You may see any of the following providers on your designated Care Team at your next follow up: Marland Kitchen Dr Arvilla Meres . Dr Marca Ancona . Tonye Becket, NP . Robbie Lis, PA . Karle Plumber, [name]   Please be sure to bring in all your medications bottles to every appointment.

## 2020-09-28 NOTE — Progress Notes (Addendum)
ADVANCED HF CLINIC NOTE  Referring Provider: Chauncy Lean FNP Primary Cardiologist: New  HPI:  Julie Hernandez is a 49 y.o. woman with COPD/ongoing tobacco abuse, premature CAD s/p anterior MI, diet-controlled DM2, chronic back pain, and systolic HF due to [facility] referred for further evaluation of CAD and systolic HF,   Has struggled with back pain and has had surgery and been on disability for some time.  Had anterior MI in 2010 taken emergently to [facility]. Stent was placed emergently. Post stenting she continued to have CP. Went to [facility] that same year and had a a second stent placed. Did well from a cardiac perspective until 3/21. Reports she has failed all statins due to intolerance. Has been on Zetia. Also has DM2 and says she previously was on insulin but now controls it with diet.   Had cath 02/08/20 at [facility] for NSTEMI. 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 Was told she may need repeat stenting vs CABG. Referred here for second opinion.   We saw her in 5/21 with daily angina. Underwent MRI which showed only minimal viability in anterior wall. Decision to proceed with PCI of LCX and LAD (ISR) over CABG. Had successful PCI of LCX and LAD with Dr. Burt Knack on 5/20.   Zio placed at last visit due to palpitations (7/31) 1. Sinus rhythm - avg HR of 82 2. 13 Ventricular Tachycardia runs occurred, the run with the fastest interval lasting 12 beats with a max rate of 139 bpm (avg 110 bpm); the run with the fastest interval was also the longest 3. Very frequents PVCs (32.2%, [id number]), VE Couplets were rare (<1.0%, 2118),  4. Ventricular Bigeminy and Trigeminy were present. 5.. Multiple patient-triggered events associated   Here for routine f/u. Has been doing better. 2 weeks ago was lying in bed and felt her heart "quivering" and "achy" and she  got panicky. Then resolved in 10-15 mins. Remains somewhat active but limited by back and leg pain. Stopped going to [facility]. Remains on Repatha/Zetia via lipid clinic. Takes lasix every other day, says she needs it due to ab bloating and finger swelling.   Studies:  cMRI 03/29/20 1. Subendocardial late gadolinium enhancement consistent with prior infarct in the LAD territory. There is >50% transmural LGE suggesting nonviability in the basal to apical anterior wall and apex. There is <50% transmural LGE suggesting viability in the basal to mid anteroseptum and apical septum  2.  LV apical thrombus measuring 12m x 722m 3. Normal LV size with severe systolic dysfunction (EF 2612% Akinesis of basal to apical anterior/anteroseptal walls and apex  4.  Small RV size with normal systolic function (EF 6275%  Past Medical History:  Diagnosis Date  . CHF (congestive heart failure) ([place]  . Coronary artery disease   . Diabetes mellitus without complication ([place]  . Hypertension     Current Outpatient Medications  Medication Sig Dispense Refill  . albuterol (VENTOLIN HFA) 108 (90 Base) MCG/ACT inhaler Inhale 2 puffs into the lungs every 4 (four) hours as needed for wheezing or shortness of breath.    . Marland Kitchenspirin EC 81 MG tablet Take 81 mg by mouth daily.    . blood glucose meter kit and supplies Dispense based on patient and insurance preference. Use up to four times daily as directed. (FOR  ICD-10 E10.9, E11.9). 1 each 1  . carvedilol (COREG) 3.125 MG tablet Take 1 tablet (3.125 mg total) by mouth 2 times daily with meals. 60 tablet 0  . clopidogrel (PLAVIX) 75 MG tablet Take 75 mg by mouth daily.    . empagliflozin (JARDIANCE) 10 MG TABS tablet Take 10 mg by mouth daily. 30 tablet 11  . Evolocumab (REPATHA SURECLICK) 740 MG/ML SOAJ Inject 1 pen into the skin every 14 (fourteen) days. 2 pen 11  . ezetimibe (ZETIA) 10 MG tablet Take 1 tablet (10 mg total) by mouth daily at 6pm. 30 tablet 11  .  famotidine (PEPCID) 20 MG tablet Take 20 mg by mouth 2 (two) times daily.    . furosemide (LASIX) 20 MG tablet Take 2 tablets (40 mg total) by mouth every other day. 60 tablet 11  . insulin glargine (LANTUS) 100 UNIT/ML Solostar Pen Inject 8 Units into the skin daily. Further refills by PCP 15 mL 0  . lisinopril (ZESTRIL) 5 MG tablet Take 5 mg by mouth daily.    . metFORMIN (GLUCOPHAGE) 500 MG tablet Take 2 tablets (1,000 mg total) by mouth 2 (two) times daily with a meal. 360 tablet 3  . nitroGLYCERIN (NITROSTAT) 0.4 MG SL tablet Place 0.4 mg under the tongue every 5 (five) minutes as needed for chest pain.    . RABEprazole (ACIPHEX) 20 MG tablet Take 40 mg by mouth daily.    Marland Kitchen spironolactone (ALDACTONE) 25 MG tablet Take 0.5 tablets (12.5 mg total) by mouth daily. 15 tablet 3   No current facility-administered medications for this encounter.    Allergies  Allergen Reactions  . Esomeprazole Magnesium Anaphylaxis    unkn  . Atorvastatin   . Ciprofibrate Itching  . Ciprofloxacin Itching  . Gabapentin     Achy  . Lubiprostone Diarrhea  . Statins     Flu like symptoms  . Amlodipine Rash  . Latex Rash and Swelling  . Other Rash and Swelling      Social History   Socioeconomic History  . Marital status: Divorced    Spouse name: Not on file  . Number of children: 2  . Years of education: Not on file  . Highest education level: Not on file  Occupational History  . Occupation: On disability  Tobacco Use  . Smoking status: Former Smoker    Quit date: 02/18/2020    Years since quitting: 0.6  . Smokeless tobacco: Never Used  Vaping Use  . Vaping Use: Some days  Substance and Sexual Activity  . Alcohol use: Yes    Comment: SOCIAL  . Drug use: Never  . Sexual activity: Not on file  Other Topics Concern  . Not on file  Social History Narrative  . Not on file   Social Determinants of Health   Financial Resource Strain:   . Difficulty of Paying Living Expenses: Not on file   Food Insecurity:   . Worried About Charity fundraiser in the Last Year: Not on file  . Ran Out of Food in the Last Year: Not on file  Transportation Needs:   . Lack of Transportation (Medical): Not on file  . Lack of Transportation (Non-Medical): Not on file  Physical Activity:   . Days of Exercise per Week: Not on file  . Minutes of Exercise per Session: Not on file  Stress:   . Feeling of Stress : Not on file  Social Connections:   . Frequency of Communication with Friends  and Family: Not on file  . Frequency of Social Gatherings with Friends and Family: Not on file  . Attends Religious Services: Not on file  . Active Member of Clubs or Organizations: Not on file  . Attends Archivist Meetings: Not on file  . Marital Status: Not on file  Intimate Partner Violence:   . Fear of Current or Ex-Partner: Not on file  . Emotionally Abused: Not on file  . Physically Abused: Not on file  . Sexually Abused: Not on file    FHx:  M died at 51 from MI and HF D had stroke No brother and sisters  Vitals:   09/28/20 1155  BP: 132/82  Pulse: 74  SpO2: 100%  Weight: 79.7 kg (175 lb 12.8 oz)    PHYSICAL EXAM: General:  Well appearing. No resp difficulty HEENT: normal Neck: supple. no JVD. Carotids 2+ bilat; no bruits. No lymphadenopathy or thryomegaly appreciated. Cor: PMI nondisplaced. Regular rate & rhythm. No rubs, gallops or murmurs. Lungs: clear Abdomen: soft, nontender, nondistended. No hepatosplenomegaly. No bruits or masses. Good bowel sounds. Extremities: no cyanosis, clubbing, rash, edema Neuro: alert & orientedx3, cranial nerves grossly intact. moves all 4 extremities w/o difficulty. Affect pleasant  ECG: NSR 80 PRWP No PVCs Personally reviewed   ASSESSMENT & PLAN:  1. CAD - s/p PCI of LCX and LAD (ISR) in 5/21. 50% mRCA lesion  - No recent angina. Has completed CR - Continue ASA/Plavix - She has been intolerant of all statin. Recent LDL 54 on  Repatha/Zetia. Follows with Lipid Clinic  2. 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  - Improved NYHA II - Increase carvedilol to 6.25 bid - Continue Jardiance - Continue spiro 12.5 - Continue lisinopril 5. Can consider switch to [facility] as BP tolerates   3. LV apical clot - resolved on echo   4. Hyperlipidemia - Intolerant of all statins. On repatha/zetia  - last LDL 54 8/21 - Follows with Lipid Clinic  5. DM2 - HgBa1c previously 11.0  - On Jardiance.  - Following with Dr. Monna Fam.  6. Tobacco use - off cigarettes but now vaping. - encouraged cessation   7. Frequent PVCs - Zio 7/31 with 32% PVCs. Unclear if this is affecting LV function.  - No PVCs on exam or ECG. EF improved   8. Palpitations - unclear etiology. Has not recurred - have asked her to get AliveCor  Glori Bickers, MD  12:38 PM

## 2020-10-01 DIAGNOSIS — G43909 Migraine, unspecified, not intractable, without status migrainosus: Secondary | ICD-10-CM | POA: Diagnosis not present

## 2020-10-01 DIAGNOSIS — F419 Anxiety disorder, unspecified: Secondary | ICD-10-CM | POA: Diagnosis not present

## 2020-10-01 DIAGNOSIS — G629 Polyneuropathy, unspecified: Secondary | ICD-10-CM | POA: Diagnosis not present

## 2020-10-01 DIAGNOSIS — K5901 Slow transit constipation: Secondary | ICD-10-CM | POA: Diagnosis not present

## 2020-10-10 ENCOUNTER — Other Ambulatory Visit: Payer: Self-pay | Admitting: Physician Assistant

## 2020-10-17 ENCOUNTER — Telehealth: Payer: Self-pay

## 2020-10-17 ENCOUNTER — Ambulatory Visit: Payer: PPO | Admitting: Internal Medicine

## 2020-10-17 DIAGNOSIS — E1165 Type 2 diabetes mellitus with hyperglycemia: Secondary | ICD-10-CM

## 2020-10-17 NOTE — Telephone Encounter (Signed)
I am not sure if she plans to return to see me.  If she does schedule a new appointment, we can refill it but without refills, until I see her.

## 2020-10-17 NOTE — Telephone Encounter (Signed)
Inbound fax from pharmacy requesting a refill on Lantus Solostar 100 unit/ML. Originally prescribed by Chauncey Mann MD. Patient's last and only OV was 04/2020 but has no showed for follow up appointments. Do you want to refill?

## 2020-10-18 MED ORDER — INSULIN GLARGINE 100 UNIT/ML SOLOSTAR PEN: 8.0000 [IU] | PEN_INJECTOR | Freq: Every day | SUBCUTANEOUS | 0 refills | Status: DC

## 2020-10-18 NOTE — Telephone Encounter (Signed)
Called patient to advise rx refill was approved and scheduled a follow up appointment for December 05 2020. Patient verbalized understanding.

## 2020-10-25 ENCOUNTER — Other Ambulatory Visit (HOSPITAL_COMMUNITY): Payer: Self-pay | Admitting: Internal Medicine

## 2020-10-25 DIAGNOSIS — E1169 Type 2 diabetes mellitus with other specified complication: Secondary | ICD-10-CM | POA: Diagnosis not present

## 2020-10-25 DIAGNOSIS — Z1389 Encounter for screening for other disorder: Secondary | ICD-10-CM | POA: Diagnosis not present

## 2020-10-25 DIAGNOSIS — K219 Gastro-esophageal reflux disease without esophagitis: Secondary | ICD-10-CM | POA: Diagnosis not present

## 2020-10-25 DIAGNOSIS — Z0001 Encounter for general adult medical examination with abnormal findings: Secondary | ICD-10-CM | POA: Diagnosis not present

## 2020-10-25 DIAGNOSIS — I10 Essential (primary) hypertension: Secondary | ICD-10-CM | POA: Diagnosis not present

## 2020-10-25 DIAGNOSIS — R609 Edema, unspecified: Secondary | ICD-10-CM | POA: Diagnosis not present

## 2020-10-25 DIAGNOSIS — E782 Mixed hyperlipidemia: Secondary | ICD-10-CM | POA: Diagnosis not present

## 2020-10-25 DIAGNOSIS — E038 Other specified hypothyroidism: Secondary | ICD-10-CM | POA: Diagnosis not present

## 2020-10-25 DIAGNOSIS — G629 Polyneuropathy, unspecified: Secondary | ICD-10-CM | POA: Diagnosis not present

## 2020-10-25 DIAGNOSIS — E559 Vitamin D deficiency, unspecified: Secondary | ICD-10-CM | POA: Diagnosis not present

## 2020-10-25 DIAGNOSIS — R635 Abnormal weight gain: Secondary | ICD-10-CM | POA: Diagnosis not present

## 2020-10-25 DIAGNOSIS — I2510 Atherosclerotic heart disease of native coronary artery without angina pectoris: Secondary | ICD-10-CM | POA: Diagnosis not present

## 2020-10-25 DIAGNOSIS — Z1331 Encounter for screening for depression: Secondary | ICD-10-CM | POA: Diagnosis not present

## 2020-11-06 DIAGNOSIS — K5901 Slow transit constipation: Secondary | ICD-10-CM | POA: Diagnosis not present

## 2020-11-06 DIAGNOSIS — F419 Anxiety disorder, unspecified: Secondary | ICD-10-CM | POA: Diagnosis not present

## 2020-11-06 DIAGNOSIS — G629 Polyneuropathy, unspecified: Secondary | ICD-10-CM | POA: Diagnosis not present

## 2020-11-06 DIAGNOSIS — G43909 Migraine, unspecified, not intractable, without status migrainosus: Secondary | ICD-10-CM | POA: Diagnosis not present

## 2020-12-02 ENCOUNTER — Telehealth (HOSPITAL_COMMUNITY): Payer: Self-pay | Admitting: Licensed Clinical Social Worker

## 2020-12-02 ENCOUNTER — Other Ambulatory Visit (HOSPITAL_COMMUNITY): Payer: Self-pay

## 2020-12-02 MED ORDER — EMPAGLIFLOZIN 10 MG PO TABS
10.0000 mg | ORAL_TABLET | Freq: Every day | ORAL | 11 refills | Status: DC
Start: 2020-12-02 — End: 2022-09-20

## 2020-12-02 NOTE — Telephone Encounter (Signed)
CSW received call from pt inquiring if we had heard anything from [facility] regarding her [facility] denial.  Pt reports she never got a letter from them so CSW informed pt they would not send [place] anything and she needs to call [employer] to request a letter be sent out.  Patient Advocate ran pt insurance and medication currently only costing $45/month- pt states this would be affordable for her at this time.  CSW had clinic staff send script over to her pharmacy.  Pt will still plan to call [employer] for a denial letter and will let us know when it arrives so we can apply for jardiance assistance.  Will continue to follow and assist as needed  Burna Sis, LCSW Clinical Social Worker [facility] Desk#: [phone] Cell#: [phone]

## 2020-12-03 DIAGNOSIS — R0981 Nasal congestion: Secondary | ICD-10-CM | POA: Diagnosis not present

## 2020-12-03 DIAGNOSIS — U071 COVID-19: Secondary | ICD-10-CM

## 2020-12-03 DIAGNOSIS — R051 Acute cough: Secondary | ICD-10-CM | POA: Diagnosis not present

## 2020-12-03 DIAGNOSIS — Z20828 Contact with and (suspected) exposure to other viral communicable diseases: Secondary | ICD-10-CM | POA: Diagnosis not present

## 2020-12-03 HISTORY — DX: COVID-19: U07.1

## 2020-12-05 ENCOUNTER — Ambulatory Visit: Payer: PPO | Admitting: Internal Medicine

## 2020-12-12 DIAGNOSIS — K5901 Slow transit constipation: Secondary | ICD-10-CM | POA: Diagnosis not present

## 2020-12-12 DIAGNOSIS — G43909 Migraine, unspecified, not intractable, without status migrainosus: Secondary | ICD-10-CM | POA: Diagnosis not present

## 2020-12-12 DIAGNOSIS — F419 Anxiety disorder, unspecified: Secondary | ICD-10-CM | POA: Diagnosis not present

## 2020-12-12 DIAGNOSIS — G629 Polyneuropathy, unspecified: Secondary | ICD-10-CM | POA: Diagnosis not present

## 2020-12-13 ENCOUNTER — Ambulatory Visit: Payer: PPO | Admitting: Internal Medicine

## 2020-12-15 DIAGNOSIS — J209 Acute bronchitis, unspecified: Secondary | ICD-10-CM | POA: Diagnosis not present

## 2020-12-15 DIAGNOSIS — R110 Nausea: Secondary | ICD-10-CM | POA: Diagnosis not present

## 2020-12-15 DIAGNOSIS — U071 COVID-19: Secondary | ICD-10-CM | POA: Diagnosis not present

## 2020-12-15 DIAGNOSIS — J019 Acute sinusitis, unspecified: Secondary | ICD-10-CM | POA: Diagnosis not present

## 2021-01-01 NOTE — Progress Notes (Signed)
Heart Failure TeleHealth Note  Due to national recommendations of social distancing due to [place] 19, Audio/video telehealth visit is felt to be most appropriate for this patient at this time.  See MyChart message from today for patient consent regarding telehealth for [facility]. The patient was identified personally using two identifiers.   Date:  01/02/2021   ID:  Julie Hernandez, DOB 1971/05/20, MRN [id number]  Location: Home  Provider location: [facility] Type of Visit: Established patient  PCP:  Truett Mainland, FNP  Cardiologist:  Glori Bickers, MD Primary HF: Earlyne Feeser  Chief Complaint: Heart Failure follow-up   History of Present Illness:  Julie Hernandez is a 50 y.o. woman with COPD/ongoing tobacco abuse, premature CAD s/p anterior MI, diet-controlled DM2, chronic back pain, and systolic HF due to [facility] referred for further evaluation of CAD and systolic HF.  Reports she has failed all statins due to intolerance. Has been on Zetia. Also has DM2 and says she previously was on insulin but now controls it with diet.   Had anterior MI in 2010 taken emergently to [facility]. Stent was placed emergently. Post stenting she continued to have CP. Went to [facility] that same year and had a a second stent placed. Did well from a cardiac perspective until 3/21.   Cath 02/08/20 at [facility] [place] for NSTEMI. 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 Was told she may need repeat stenting vs CABG. Referred here for second opinion.   We saw her in 5/21 with daily angina. Underwent MRI which showed EF 26% only minimal viability in anterior wall. Decision to proceed with PCI of LCX and LAD (ISR) over CABG. Had successful PCI of LCX and LAD with Dr. Burt Knack on 5/20.   Echo 9/21 EF 60-65%  She presents for audio conferencing for a  telehealth visit today (she was unable to log into MyChart despite multiple attempts).  Overall doing fairly well. Had COVID last month but has recovered. Was sick for 14 days (not vaccinated).  Denies CP. SOB getting better. Occasional edema. Takes extra lasix as needed. No orthopnea or PND. No problems with medications.   ARMEDA PLUMB denies symptoms worrisome for COVID 19.     Studies:  Zio 7/31 1. Sinus rhythm - avg HR of 82 2. 13 Ventricular Tachycardia runs occurred, the run with the fastest interval lasting 12 beats with a max rate of 139 bpm (avg 110 bpm); the run with the fastest interval was also the longest 3. Very frequents PVCs (32.2%, [id number]), VE Couplets were rare (<1.0%, 2118),  4. Ventricular Bigeminy and Trigeminy were present. 5.. Multiple patient-triggered events associated  cMRI 03/29/20 1. Subendocardial late gadolinium enhancement consistent with prior infarct in the LAD territory. There is >50% transmural LGE suggesting nonviability in the basal to apical anterior wall and apex. There is <50% transmural LGE suggesting viability in the basal to mid anteroseptum and apical septum  2.  LV apical thrombus measuring 62mm x 57mm  3. Normal LV size with severe systolic dysfunction (EF 73%). Akinesis of basal to apical anterior/anteroseptal walls and apex  4.  Small RV size with normal systolic function (EF 41%)   Past Medical History:  Diagnosis Date  . CHF (congestive heart failure) ([place])   . Coronary artery disease   . Diabetes mellitus without  complication ([place])   . Hypertension     Current Outpatient Medications  Medication Sig Dispense Refill  . albuterol (VENTOLIN HFA) 108 (90 Base) MCG/ACT inhaler Inhale 2 puffs into the lungs every 4 (four) hours as needed for wheezing or shortness of breath.    Marland Kitchen aspirin EC 81 MG tablet Take 81 mg by mouth daily.    . blood glucose meter kit and supplies Dispense based on patient and insurance preference. Use up to  four times daily as directed. (FOR ICD-10 E10.9, E11.9). 1 each 1  . carvedilol (COREG) 6.25 MG tablet Take 1 tablet (6.25 mg total) by mouth 2 (two) times daily with a meal. 60 tablet 3  . clopidogrel (PLAVIX) 75 MG tablet Take 75 mg by mouth daily.    . empagliflozin (JARDIANCE) 10 MG TABS tablet Take 1 tablet (10 mg total) by mouth daily. 30 tablet 11  . Evolocumab (REPATHA SURECLICK) 263 MG/ML SOAJ Inject 1 pen into the skin every 14 (fourteen) days. 2 pen 11  . ezetimibe (ZETIA) 10 MG tablet Take 1 tablet (10 mg total) by mouth daily at 6pm. 30 tablet 11  . famotidine (PEPCID) 20 MG tablet Take 20 mg by mouth 2 (two) times daily.    . furosemide (LASIX) 20 MG tablet Take 2 tablets (40 mg total) by mouth every other day. 60 tablet 11  . insulin glargine (LANTUS) 100 UNIT/ML Solostar Pen Inject 8 Units into the skin daily. Further refills by PCP 15 mL 0  . lisinopril (ZESTRIL) 5 MG tablet Take 5 mg by mouth daily.    . metFORMIN (GLUCOPHAGE) 500 MG tablet Take 2 tablets (1,000 mg total) by mouth 2 (two) times daily with a meal. 360 tablet 3  . nitroGLYCERIN (NITROSTAT) 0.4 MG SL tablet Place 0.4 mg under the tongue every 5 (five) minutes as needed for chest pain.    . RABEprazole (ACIPHEX) 20 MG tablet Take 40 mg by mouth daily.    Marland Kitchen spironolactone (ALDACTONE) 25 MG tablet Take 0.5 tablets (12.5 mg total) by mouth daily. 15 tablet 3   No current facility-administered medications for this encounter.    Allergies  Allergen Reactions  . Esomeprazole Magnesium Anaphylaxis    unkn  . Atorvastatin   . Ciprofibrate Itching  . Ciprofloxacin Itching  . Gabapentin     Achy  . Lubiprostone Diarrhea  . Statins     Flu like symptoms  . Amlodipine Rash  . Latex Rash and Swelling  . Other Rash and Swelling      Social History   Socioeconomic History  . Marital status: Divorced    Spouse name: Not on file  . Number of children: 2  . Years of education: Not on file  . Highest education  level: Not on file  Occupational History  . Occupation: On disability  Tobacco Use  . Smoking status: Former Smoker    Quit date: 02/18/2020    Years since quitting: 0.8  . Smokeless tobacco: Never Used  Vaping Use  . Vaping Use: Some days  Substance and Sexual Activity  . Alcohol use: Yes    Comment: SOCIAL  . Drug use: Never  . Sexual activity: Not on file  Other Topics Concern  . Not on file  Social History Narrative  . Not on file   Social Determinants of Health   Financial Resource Strain: Not on file  Food Insecurity: Not on file  Transportation Needs: Not on file  Physical Activity: Not on  file  Stress: Not on file  Social Connections: Not on file  Intimate Partner Violence: Not on file    FHx:  M died at 37 from MI and HF D had stroke No brother and sisters  BP 110/68  HR 70s  Exam:  (Video/Tele Health Call; Exam is subjective and or/visual.) General:  Speaks in full sentences. No resp difficulty. Lungs: Normal respiratory effort with conversation.  Abdomen: Non-distended per patient report Extremities: Pt denies edema. Neuro: Alert & oriented x 3.   Recent Labs: 04/08/2020: Hemoglobin 13.0; Platelets 242 06/21/2020: ALT 13 07/26/2020: B Natriuretic Peptide 62.7 09/28/2020: BUN 13; Creatinine, Ser 0.91; Magnesium 2.2; Potassium 4.8; Sodium 141  Personally reviewed   Wt Readings from Last 3 Encounters:  09/28/20 79.7 kg (175 lb 12.8 oz)  07/26/20 78.6 kg (173 lb 3.2 oz)  05/12/20 76.7 kg (169 lb)     ASSESSMENT & PLAN:  1. CAD - s/p PCI of LCX and LAD (ISR) in 5/21. 50% mRCA lesion  - No s/s angina - Continue ASA/Plavix - She has been intolerant of all statin. Recent LDL 54 on Repatha/Zetia. Follows with [facility]  2. Chronic systolic HF due to iCM - EF 26% by cMRI with dense LAD infarct and apical aneurysm - Echo 9/21 EF 55-60%  - Volume status looks on lasix 20 qod. Takes extra as needed - Stable NYHA II - Continue carvedilol to 6.25 bid -  Continue Jardiance - Continue spiro 12.5 - Continue lisinopril 5. Can consider switch to [facility] as BP tolerates  - See back in 3 months with May  3. LV apical clot - resolved on echo   4. Hyperlipidemia - Intolerant of all statins. On repatha/zetia  - last LDL 54 8/21 - Follows with [facility] Clinic  5. DM2 - HgBa1c previously 11.0  - On Jardiance.  - Following with Dr. Monna Fam.  6. Tobacco use - off cigarettes but now vaping. - encouraged cessation   7. Frequent PVCs - Zio 7/31 with 32% PVCs. Unclear if this is affecting LV function.  - Resolved  8. Palpitations - unclear etiology. Has not recurred - have asked her to get AliveCor  COVID screen The patient does not have any symptoms that suggest any further testing/ screening at this time.  Social distancing reinforced today.  Recommended follow-up:  As above  Relevant cardiac medications were reviewed at length with the patient today.   The patient does not have concerns regarding their medications at this time.   The following changes were made today:  As above  Today, I have spent 12 minutes with the patient with telehealth technology discussing the above issues .     Glori Bickers, MD  1:42 PM

## 2021-01-02 ENCOUNTER — Other Ambulatory Visit: Payer: Self-pay

## 2021-01-02 ENCOUNTER — Encounter (HOSPITAL_COMMUNITY): Payer: Self-pay | Admitting: Internal Medicine

## 2021-01-02 ENCOUNTER — Telehealth (HOSPITAL_COMMUNITY): Payer: Self-pay

## 2021-01-02 ENCOUNTER — Ambulatory Visit (HOSPITAL_COMMUNITY)
Admission: RE | Admit: 2021-01-02 | Discharge: 2021-01-02 | Disposition: A | Discharge status: Home / Self Care | Payer: PPO | Source: Ambulatory Visit | Attending: Internal Medicine | Admitting: Internal Medicine

## 2021-01-02 ENCOUNTER — Encounter (HOSPITAL_COMMUNITY): Payer: Self-pay

## 2021-01-02 DIAGNOSIS — I5022 Chronic systolic (congestive) heart failure: Secondary | ICD-10-CM | POA: Diagnosis not present

## 2021-01-02 DIAGNOSIS — E119 Type 2 diabetes mellitus without complications: Secondary | ICD-10-CM

## 2021-01-02 DIAGNOSIS — I493 Ventricular premature depolarization: Secondary | ICD-10-CM

## 2021-01-02 DIAGNOSIS — I2510 Atherosclerotic heart disease of native coronary artery without angina pectoris: Secondary | ICD-10-CM

## 2021-01-02 NOTE — Addendum Note (Signed)
Encounter addended by: Chinita Pester, CMA on: 01/02/2021 2:31 PM  Actions taken: Clinical Note Signed, Order list changed, Diagnosis association updated

## 2021-01-02 NOTE — Patient Instructions (Signed)
No medication changes were made. Please continue all current medications as prescribed.  Your physician recommends that you schedule a follow-up appointment in: 3 months with an echo prior to your appointment.  Your physician has requested that you have an echocardiogram. Echocardiography is a painless test that uses sound waves to create images of your heart. It provides your doctor with information about the size and shape of your heart and how well your heart's chambers and valves are working. This procedure takes approximately one hour. There are no restrictions for this procedure.   If you have any questions or concerns before your next appointment please send [place] a message through [place] or call our office at [phone].    TO LEAVE A MESSAGE FOR THE NURSE SELECT OPTION 2, PLEASE LEAVE A MESSAGE INCLUDING: . YOUR NAME . DATE OF BIRTH . CALL BACK NUMBER . REASON FOR CALL**this is important as we prioritize the call backs  YOU WILL RECEIVE A CALL BACK THE SAME DAY AS LONG AS YOU CALL BEFORE 4:00 PM   Do the following things EVERYDAY: 1) Weigh yourself in the morning before breakfast. Write it down and keep it in a log. 2) Take your medicines as prescribed 3) Eat low salt foods--Limit salt (sodium) to 2000 mg per day.  4) Stay as active as you can everyday 5) Limit all fluids for the day to less than 2 liters   At the [facility], you and your health needs are our priority. As part of our continuing mission to provide you with exceptional heart care, we have created designated Provider Care Teams. These Care Teams include your primary Cardiologist (physician) and Advanced Practice Providers (APPs- Physician Assistants and Nurse Practitioners) who all work together to provide you with the care you need, when you need it.   You may see any of the following providers on your designated Care Team at your next follow up: Marland Kitchen Dr Arvilla Meres . Dr Marca Ancona . Tonye Becket, NP . Robbie Lis, PA . Karle Plumber, [name]   Please be sure to bring in all your medications bottles to every appointment.

## 2021-01-02 NOTE — Telephone Encounter (Signed)
  Patient Consent for Virtual Visit         Julie Hernandez has provided verbal consent on 01/02/2021 for a virtual visit (video or telephone).   CONSENT FOR VIRTUAL VISIT FOR:  Julie Hernandez  By participating in this virtual visit I agree to the following:  I hereby voluntarily request, consent and authorize [facility] and its employed or contracted physicians, physician assistants, nurse practitioners or other licensed health care professionals (the Practitioner), to provide me with telemedicine health care services (the "Services") as deemed necessary by the treating Practitioner. I acknowledge and consent to receive the Services by the Practitioner via telemedicine. I understand that the telemedicine visit will involve communicating with the Practitioner through live audiovisual communication technology and the disclosure of certain medical information by electronic transmission. I acknowledge that I have been given the opportunity to request an in-person assessment or other available alternative prior to the telemedicine visit and am voluntarily participating in the telemedicine visit.  I understand that I have the right to withhold or withdraw my consent to the use of telemedicine in the course of my care at any time, without affecting my right to future care or treatment, and that the Practitioner or I may terminate the telemedicine visit at any time. I understand that I have the right to inspect all information obtained and/or recorded in the course of the telemedicine visit and may receive copies of available information for a reasonable fee.  I understand that some of the potential risks of receiving the Services via telemedicine include:  Marland Kitchen Delay or interruption in medical evaluation due to technological equipment failure or disruption; . Information transmitted may not be sufficient (e.g. poor resolution of images) to allow for appropriate medical decision making by the Practitioner;  and/or  . In rare instances, security protocols could fail, causing a breach of personal health information.  Furthermore, I acknowledge that it is my responsibility to provide information about my medical history, conditions and care that is complete and accurate to the best of my ability. I acknowledge that Practitioner's advice, recommendations, and/or decision may be based on factors not within their control, such as incomplete or inaccurate data provided by me or distortions of diagnostic images or specimens that may result from electronic transmissions. I understand that the practice of medicine is not an exact science and that Practitioner makes no warranties or guarantees regarding treatment outcomes. I acknowledge that a copy of this consent can be made available to me via my patient portal [facility] MyChart), or I can request a printed copy by calling the office of [facility].    I understand that my insurance will be billed for this visit.   I have read or had this consent read to me. . I understand the contents of this consent, which adequately explains the benefits and risks of the Services being provided via telemedicine.  . I have been provided ample opportunity to ask questions regarding this consent and the Services and have had my questions answered to my satisfaction. . I give my informed consent for the services to be provided through the use of telemedicine in my medical care

## 2021-01-05 ENCOUNTER — Emergency Department (HOSPITAL_COMMUNITY): Payer: PPO

## 2021-01-05 ENCOUNTER — Other Ambulatory Visit: Payer: Self-pay

## 2021-01-05 ENCOUNTER — Encounter (HOSPITAL_COMMUNITY): Payer: Self-pay | Admitting: Emergency Medicine

## 2021-01-05 ENCOUNTER — Emergency Department (HOSPITAL_COMMUNITY)
Admission: EM | Admit: 2021-01-05 | Discharge: 2021-01-05 | Disposition: A | Discharge status: Home / Self Care | Payer: PPO | Attending: Emergency Medicine | Admitting: Emergency Medicine

## 2021-01-05 DIAGNOSIS — Z87891 Personal history of nicotine dependence: Secondary | ICD-10-CM | POA: Diagnosis not present

## 2021-01-05 DIAGNOSIS — Z7982 Long term (current) use of aspirin: Secondary | ICD-10-CM | POA: Insufficient documentation

## 2021-01-05 DIAGNOSIS — R252 Cramp and spasm: Secondary | ICD-10-CM | POA: Insufficient documentation

## 2021-01-05 DIAGNOSIS — I110 Hypertensive heart disease with heart failure: Secondary | ICD-10-CM | POA: Diagnosis not present

## 2021-01-05 DIAGNOSIS — Z8616 Personal history of COVID-19: Secondary | ICD-10-CM | POA: Diagnosis not present

## 2021-01-05 DIAGNOSIS — E860 Dehydration: Secondary | ICD-10-CM | POA: Insufficient documentation

## 2021-01-05 DIAGNOSIS — Z79899 Other long term (current) drug therapy: Secondary | ICD-10-CM | POA: Diagnosis not present

## 2021-01-05 DIAGNOSIS — R1084 Generalized abdominal pain: Secondary | ICD-10-CM | POA: Insufficient documentation

## 2021-01-05 DIAGNOSIS — R1110 Vomiting, unspecified: Secondary | ICD-10-CM | POA: Diagnosis not present

## 2021-01-05 DIAGNOSIS — Z7902 Long term (current) use of antithrombotics/antiplatelets: Secondary | ICD-10-CM | POA: Insufficient documentation

## 2021-01-05 DIAGNOSIS — Z91040 Latex allergy status: Secondary | ICD-10-CM | POA: Diagnosis not present

## 2021-01-05 DIAGNOSIS — R112 Nausea with vomiting, unspecified: Secondary | ICD-10-CM | POA: Insufficient documentation

## 2021-01-05 DIAGNOSIS — I959 Hypotension, unspecified: Secondary | ICD-10-CM | POA: Diagnosis not present

## 2021-01-05 DIAGNOSIS — M7918 Myalgia, other site: Secondary | ICD-10-CM | POA: Diagnosis not present

## 2021-01-05 DIAGNOSIS — E1159 Type 2 diabetes mellitus with other circulatory complications: Secondary | ICD-10-CM | POA: Insufficient documentation

## 2021-01-05 DIAGNOSIS — R739 Hyperglycemia, unspecified: Secondary | ICD-10-CM | POA: Diagnosis not present

## 2021-01-05 DIAGNOSIS — R109 Unspecified abdominal pain: Secondary | ICD-10-CM | POA: Diagnosis not present

## 2021-01-05 DIAGNOSIS — R5383 Other fatigue: Secondary | ICD-10-CM | POA: Diagnosis not present

## 2021-01-05 DIAGNOSIS — R079 Chest pain, unspecified: Secondary | ICD-10-CM | POA: Diagnosis not present

## 2021-01-05 DIAGNOSIS — I5022 Chronic systolic (congestive) heart failure: Secondary | ICD-10-CM | POA: Diagnosis not present

## 2021-01-05 DIAGNOSIS — R509 Fever, unspecified: Secondary | ICD-10-CM | POA: Diagnosis not present

## 2021-01-05 DIAGNOSIS — Z794 Long term (current) use of insulin: Secondary | ICD-10-CM | POA: Insufficient documentation

## 2021-01-05 DIAGNOSIS — Z7984 Long term (current) use of oral hypoglycemic drugs: Secondary | ICD-10-CM | POA: Diagnosis not present

## 2021-01-05 DIAGNOSIS — I2510 Atherosclerotic heart disease of native coronary artery without angina pectoris: Secondary | ICD-10-CM | POA: Insufficient documentation

## 2021-01-05 DIAGNOSIS — R000 Tachycardia, unspecified: Secondary | ICD-10-CM | POA: Diagnosis not present

## 2021-01-05 LAB — URINALYSIS, ROUTINE W REFLEX MICROSCOPIC
Bacteria, UA: NONE SEEN
Bilirubin Urine: NEGATIVE
Glucose, UA: >=500 mg/dL — AB
Hgb urine dipstick: NEGATIVE
Ketones, ur: 20 mg/dL — AB
Leukocytes,Ua: NEGATIVE
Nitrite: NEGATIVE
Protein, ur: NEGATIVE mg/dL
Specific Gravity, Urine: 1.029 (ref 1.005–1.030)
pH: 5 (ref 5.0–8.0)

## 2021-01-05 LAB — CBC
HCT: 55.8 % — ABNORMAL HIGH (ref 36.0–46.0)
Hemoglobin: 17.2 g/dL — ABNORMAL HIGH (ref 12.0–15.0)
MCH: 27.7 pg (ref 26.0–34.0)
MCHC: 30.8 g/dL (ref 30.0–36.0)
MCV: 89.7 fL (ref 80.0–100.0)
Platelets: 289 10*3/uL (ref 150–400)
RBC: 6.22 MIL/uL — ABNORMAL HIGH (ref 3.87–5.11)
RDW: 13.8 % (ref 11.5–15.5)
WBC: 10.6 10*3/uL — ABNORMAL HIGH (ref 4.0–10.5)
nRBC: 0 % (ref 0.0–0.2)

## 2021-01-05 LAB — BASIC METABOLIC PANEL
Anion gap: 18 — ABNORMAL HIGH (ref 5–15)
BUN: 30 mg/dL — ABNORMAL HIGH (ref 6–20)
CO2: 19 mmol/L — ABNORMAL LOW (ref 22–32)
Calcium: 9.4 mg/dL (ref 8.9–10.3)
Chloride: 97 mmol/L — ABNORMAL LOW (ref 98–111)
Creatinine, Ser: 1.3 mg/dL — ABNORMAL HIGH (ref 0.44–1.00)
GFR, Estimated: 50 mL/min — ABNORMAL LOW (ref >60)
Glucose, Bld: 213 mg/dL — ABNORMAL HIGH (ref 70–99)
Potassium: 5 mmol/L (ref 3.5–5.1)
Sodium: 134 mmol/L — ABNORMAL LOW (ref 135–145)

## 2021-01-05 LAB — LACTIC ACID, PLASMA: Lactic Acid, Venous: 1.7 mmol/L (ref 0.5–1.9)

## 2021-01-05 LAB — TROPONIN I (HIGH SENSITIVITY)
Troponin I (High Sensitivity): 9 ng/L (ref <18)
Troponin I (High Sensitivity): 10 ng/L (ref <18)

## 2021-01-05 LAB — MAGNESIUM: Magnesium: 2.4 mg/dL (ref 1.7–2.4)

## 2021-01-05 MED ORDER — ONDANSETRON HCL 4 MG/2ML IJ SOLN
4.0000 mg | Freq: Once | INTRAMUSCULAR | Status: AC
  Administered 2021-01-05: 4 mg via INTRAVENOUS
  Filled 2021-01-05: qty 2

## 2021-01-05 MED ORDER — SODIUM CHLORIDE 0.9 % IV BOLUS
1000.0000 mL | Freq: Once | INTRAVENOUS | Status: AC
  Administered 2021-01-05: 1000 mL via INTRAVENOUS

## 2021-01-05 MED ORDER — ONDANSETRON 4 MG PO TBDP: 4.0000 mg | ORAL_TABLET | Freq: Three times a day (TID) | ORAL | 0 refills | Status: DC | PRN

## 2021-01-05 MED ORDER — FENTANYL CITRATE (PF) 100 MCG/2ML IJ SOLN
25.0000 ug | Freq: Once | INTRAMUSCULAR | Status: AC
  Administered 2021-01-05: 25 ug via INTRAVENOUS
  Filled 2021-01-05: qty 2

## 2021-01-05 MED ORDER — HYDROCODONE-ACETAMINOPHEN 5-325 MG PO TABS: 1.0000 | ORAL_TABLET | ORAL | 0 refills | Status: DC | PRN

## 2021-01-05 MED ORDER — IOHEXOL 300 MG/ML  SOLN
100.0000 mL | Freq: Once | INTRAMUSCULAR | Status: AC | PRN
  Administered 2021-01-05: 100 mL via INTRAVENOUS

## 2021-01-05 NOTE — ED Triage Notes (Signed)
Patient complains of fever, abdominal pain, weakness, emesis, and lower extremity cramps that started earlier in the week. Patient is alert, oriented, diaphoretic. History of MI with stents in 2021.

## 2021-01-05 NOTE — ED Provider Notes (Signed)
[facility] EMERGENCY DEPARTMENT Provider Note   CSN: [id number] Arrival date & time: 01/05/21  1542     History Chief Complaint  Patient presents with  . Abdominal Pain    Julie Hernandez is a 50 y.o. female.  Pt presents to the ED today with abdominal pain, weakness, n/v, and leg cramps.  Pt has a hx of CAD s/p stent.  She also has CHF with an EF back up to 55% on ECHO 9/21.  She said she she had Covid in January and has not fully recovered since Covid.  She has been on 2 rounds of abx for sinus infections from the covid.  Pt said starting on Monday, she started feeling worse.  She has vomited several times despite taking phenergan.  She did have some cp but that is gone now.         Past Medical History:  Diagnosis Date  . CHF (congestive heart failure) ([place])   . Coronary artery disease   . COVID 12/03/2020  . Diabetes mellitus without complication ([place])   . Hypertension     Patient Active Problem List   Diagnosis Date Noted  . Poorly controlled type 2 diabetes mellitus with circulatory disorder ([place]) 05/12/2020  . Chronic systolic heart failure ([place])   . CAD (coronary artery disease) 04/07/2020  . Systolic dysfunction without heart failure 02/09/2020  . Non-ST elevation (NSTEMI) myocardial infarction ([place]) 02/06/2020  . Hyperlipidemia 09/17/2017  . Angina pectoris ([place]) 09/17/2017    Past Surgical History:  Procedure Laterality Date  . CORONARY BALLOON ANGIOPLASTY N/A 04/07/2020   Procedure: CORONARY BALLOON ANGIOPLASTY;  Surgeon: Sherren Mocha, MD;  Location: [facility];  Service: Cardiovascular;  Laterality: N/A;  . CORONARY STENT INTERVENTION N/A 04/07/2020   Procedure: CORONARY STENT INTERVENTION;  Surgeon: Sherren Mocha, MD;  Location: [facility];  Service: Cardiovascular;  Laterality: N/A;  . INTRAVASCULAR PRESSURE WIRE/FFR STUDY N/A 04/07/2020   Procedure: INTRAVASCULAR PRESSURE WIRE/FFR STUDY;  Surgeon: Sherren Mocha,  MD;  Location: [facility];  Service: Cardiovascular;  Laterality: N/A;  . INTRAVASCULAR ULTRASOUND/IVUS N/A 04/07/2020   Procedure: Intravascular Ultrasound/IVUS;  Surgeon: Sherren Mocha, MD;  Location: [facility];  Service: Cardiovascular;  Laterality: N/A;  . LEFT HEART CATH AND CORONARY ANGIOGRAPHY N/A 04/07/2020   Procedure: LEFT HEART CATH AND CORONARY ANGIOGRAPHY;  Surgeon: Sherren Mocha, MD;  Location: [facility];  Service: Cardiovascular;  Laterality: N/A;     OB History   No obstetric history on file.     Family History  Problem Relation Age of Onset  . Diabetes Mother   . Hypertension Mother   . Heart disease Mother   . Hyperlipidemia Mother     Social History   Tobacco Use  . Smoking status: Former Smoker    Quit date: 02/18/2020    Years since quitting: 0.8  . Smokeless tobacco: Never Used  Vaping Use  . Vaping Use: Some days  Substance Use Topics  . Alcohol use: Yes    Comment: SOCIAL  . Drug use: Never    Home Medications Prior to Admission medications   Medication Sig Start Date End Date Taking? Authorizing Provider  HYDROcodone-acetaminophen (NORCO/VICODIN) 5-325 MG tablet Take 1 tablet by mouth every 4 (four) hours as needed. 01/05/21  Yes Isla Pence, MD  ondansetron (ZOFRAN ODT) 4 MG disintegrating tablet Take 1 tablet (4 mg total) by mouth every 8 (eight) hours as needed for nausea or vomiting.  01/05/21  Yes Isla Pence, MD  albuterol (VENTOLIN HFA) 108 (90 Base) MCG/ACT inhaler Inhale 2 puffs into the lungs every 4 (four) hours as needed for wheezing or shortness of breath.    [provider]  aspirin EC 81 MG tablet Take 81 mg by mouth daily.    [provider]  blood glucose meter kit and supplies Dispense based on patient and insurance preference. Use up to four times daily as directed. (FOR ICD-10 E10.9, E11.9). 04/08/20   Bhagat, Crista Luria, PA  carvedilol (COREG) 6.25 MG tablet Take 1 tablet (6.25 mg  total) by mouth 2 (two) times daily with a meal. 09/28/20   Bensimhon, Shaune Pascal, MD  clopidogrel (PLAVIX) 75 MG tablet Take 75 mg by mouth daily.    [provider]  empagliflozin (JARDIANCE) 10 MG TABS tablet Take 1 tablet (10 mg total) by mouth daily. 12/02/20   Bensimhon, Shaune Pascal, MD  Evolocumab (REPATHA SURECLICK) 644 MG/ML SOAJ Inject 1 pen into the skin every 14 (fourteen) days. 04/21/20   Sherren Mocha, MD  ezetimibe (ZETIA) 10 MG tablet Take 1 tablet (10 mg total) by mouth daily at 6pm. 08/25/20   Bensimhon, Shaune Pascal, MD  famotidine (PEPCID) 20 MG tablet Take 20 mg by mouth 2 (two) times daily.    [provider]  furosemide (LASIX) 20 MG tablet Take 2 tablets (40 mg total) by mouth every other day. 07/26/20   Bensimhon, Shaune Pascal, MD  insulin glargine (LANTUS) 100 UNIT/ML Solostar Pen Inject 8 Units into the skin daily. Further refills by PCP 10/18/20   Philemon Kingdom, MD  lisinopril (ZESTRIL) 5 MG tablet Take 5 mg by mouth daily.    [provider]  metFORMIN (GLUCOPHAGE) 500 MG tablet Take 2 tablets (1,000 mg total) by mouth 2 (two) times daily with a meal. 05/12/20   Philemon Kingdom, MD  nitroGLYCERIN (NITROSTAT) 0.4 MG SL tablet Place 0.4 mg under the tongue every 5 (five) minutes as needed for chest pain.    [provider]  RABEprazole (ACIPHEX) 20 MG tablet Take 40 mg by mouth daily.    [provider]  spironolactone (ALDACTONE) 25 MG tablet Take 0.5 tablets (12.5 mg total) by mouth daily. 10/25/20   Bensimhon, Shaune Pascal, MD    Allergies    Esomeprazole magnesium, Atorvastatin, Ciprofibrate, Ciprofloxacin, Gabapentin, Lubiprostone, Statins, Amlodipine, Latex, and Other  Review of Systems   Review of Systems  Constitutional: Positive for diaphoresis and fatigue.  Gastrointestinal: Positive for abdominal pain, nausea and vomiting.  All other systems reviewed and are negative.   Physical Exam Updated Vital Signs BP 97/65   Pulse 80    Temp 98.4 F (36.9 C) (Oral)   Resp 16   SpO2 99%   Physical Exam Vitals and nursing note reviewed.  Constitutional:      Appearance: She is well-developed.  HENT:     Head: Normocephalic and atraumatic.     Mouth/Throat:     Mouth: Mucous membranes are dry.  Eyes:     Extraocular Movements: Extraocular movements intact.     Pupils: Pupils are equal, round, and reactive to light.  Cardiovascular:     Rate and Rhythm: Normal rate and regular rhythm.  Abdominal:     General: Abdomen is flat. Bowel sounds are normal.     Palpations: Abdomen is soft.     Tenderness: There is generalized abdominal tenderness.  Skin:    General: Skin is warm.     Capillary Refill:  Capillary refill takes less than 2 seconds.  Neurological:     General: No focal deficit present.     Mental Status: She is alert and oriented to person, place, and time.  Psychiatric:        Mood and Affect: Mood normal.        Behavior: Behavior normal.     ED Results / Procedures / Treatments   Labs (all labs ordered are listed, but only abnormal results are displayed) Labs Reviewed  BASIC METABOLIC PANEL - Abnormal; Notable for the following components:      Result Value   Sodium 134 (*)    Chloride 97 (*)    CO2 19 (*)    Glucose, Bld 213 (*)    BUN 30 (*)    Creatinine, Ser 1.30 (*)    GFR, Estimated 50 (*)    Anion gap 18 (*)    All other components within normal limits  CBC - Abnormal; Notable for the following components:   WBC 10.6 (*)    RBC 6.22 (*)    Hemoglobin 17.2 (*)    HCT 55.8 (*)    All other components within normal limits  URINALYSIS, ROUTINE W REFLEX MICROSCOPIC - Abnormal; Notable for the following components:   Color, Urine STRAW (*)    Glucose, UA >=500 (*)    Ketones, ur 20 (*)    All other components within normal limits  CULTURE, BLOOD (ROUTINE X 2)  CULTURE, BLOOD (ROUTINE X 2)  URINE CULTURE  LACTIC ACID, PLASMA  MAGNESIUM  LACTIC ACID, PLASMA  TROPONIN I (HIGH  SENSITIVITY)  TROPONIN I (HIGH SENSITIVITY)    EKG EKG Interpretation  Date/Time:  Thursday January 05 2021 15:59:02 EST Ventricular Rate:  111 PR Interval:  124 QRS Duration: 76 QT Interval:  338 QTC Calculation: 459 R Axis:   7 Text Interpretation: Sinus tachycardia Minimal voltage criteria for LVH, may be normal variant ( R in aVL ) Septal infarct , age undetermined Abnormal ECG Since last tracing rate faster Confirmed by Isla Pence [phone]) on 01/05/2021 5:08:26 PM   Radiology DG Chest 2 View  Result Date: 01/05/2021 CLINICAL DATA:  Emesis, fever, chills, muscle cramps for 1 week, chest pain EXAM: CHEST - 2 VIEW COMPARISON:  02/06/2020 FINDINGS: The heart size and mediastinal contours are within normal limits. Both lungs are clear. The visualized skeletal structures are unremarkable. IMPRESSION: No active cardiopulmonary disease. Electronically Signed   By: Randa Ngo M.D.   On: 01/05/2021 16:26   CT ABDOMEN PELVIS W CONTRAST  Result Date: 01/05/2021 CLINICAL DATA:  Abdominal pain and fever EXAM: CT ABDOMEN AND PELVIS WITH CONTRAST TECHNIQUE: Multidetector CT imaging of the abdomen and pelvis was performed using the standard protocol following bolus administration of intravenous contrast. CONTRAST:  15m OMNIPAQUE IOHEXOL 300 MG/ML  SOLN COMPARISON:  None. FINDINGS: Lower chest: Lung bases are clear. Hepatobiliary: No focal liver lesions are appreciable. Gallbladder is absent. There is no biliary duct dilatation. Pancreas: There is no pancreatic mass or inflammatory focus. Spleen: No splenic lesions are evident. Adrenals/Urinary Tract: Adrenals bilaterally appear normal. There are fetal lobulations in each kidney, an anatomic variant. There is no appreciable renal mass or hydronephrosis on either side. There is no appreciable renal or ureteral calculus on either side. Urinary bladder is midline with wall thickness within normal limits. Stomach/Bowel: There is no appreciable bowel  wall or mesenteric thickening. There is moderate stool throughout the colon. No bowel obstruction evident. The terminal ileum appears  normal. There is no evident free air or portal venous air. Vascular/Lymphatic: There is no abdominal aortic aneurysm. There is aortic and iliac artery atherosclerotic calcification. Major venous structures appear patent. There is no appreciable adenopathy in the abdomen or pelvis. Reproductive: Uterus is anteverted. There is an intrauterine device within the endometrium. No evident adnexal masses. Other: No periappendiceal region inflammatory change noted. No abscess or ascites evident in the abdomen or pelvis. Musculoskeletal: No blastic or lytic bone lesions. No abdominal wall or intramuscular lesions are evident. IMPRESSION: 1. No evident bowel wall thickening or bowel obstruction. No abscess in the abdomen or pelvis. No periappendiceal region inflammatory change. 2. No evident renal or ureteral calculus. No hydronephrosis on either side. Urinary bladder wall thickness normal. 3.  Aortic Atherosclerosis (ICD10-I70.0). 4.  Intrauterine device positioned within the endometrium. 5.  Gallbladder absent. Electronically Signed   By: Lowella Grip III M.D.   On: 01/05/2021 18:41    Procedures Procedures   Medications Ordered in ED Medications  sodium chloride 0.9 % bolus 1,000 mL (0 mLs Intravenous Stopped 01/05/21 1846)  ondansetron (ZOFRAN) injection 4 mg (4 mg Intravenous Given 01/05/21 1745)  fentaNYL (SUBLIMAZE) injection 25 mcg (25 mcg Intravenous Given 01/05/21 1745)  iohexol (OMNIPAQUE) 300 MG/ML solution 100 mL (100 mLs Intravenous Contrast Given 01/05/21 1826)  sodium chloride 0.9 % bolus 1,000 mL (0 mLs Intravenous Stopped 01/05/21 2023)    ED Course  I have reviewed the triage vital signs and the nursing notes.  Pertinent labs & imaging results that were available during my care of the patient were reviewed by me and considered in my medical decision making  (see chart for details).    MDM Rules/Calculators/A&P                          Cardiac work up is negative.  Pt is feeling much better after 2L NS.  She had a negative CT abd/pelvis.  Labs c/w dehydration.  She's ready to go home.  She is stable for d/c.  Return if worse.  Final Clinical Impression(s) / ED Diagnoses Final diagnoses:  Generalized abdominal pain  Non-intractable vomiting with nausea, unspecified vomiting type  Muscle cramps  Hypotension, unspecified hypotension type  Dehydration    Rx / DC Orders ED Discharge Orders         Ordered    ondansetron (ZOFRAN ODT) 4 MG disintegrating tablet  Every 8 hours PRN        01/05/21 2023    HYDROcodone-acetaminophen (NORCO/VICODIN) 5-325 MG tablet  Every 4 hours PRN        01/05/21 2023           Isla Pence, MD 01/05/21 2024

## 2021-01-05 NOTE — ED Notes (Signed)
Patient transported to CT 

## 2021-01-05 NOTE — Discharge Instructions (Addendum)
Check your blood pressure in the morning prior to taking your medications.  Do not take your blood pressure medications if your blood pressure is still low.

## 2021-01-07 LAB — URINE CULTURE: Culture: NO GROWTH

## 2021-01-10 LAB — CULTURE, BLOOD (ROUTINE X 2)
Culture: NO GROWTH
Special Requests: ADEQUATE

## 2021-02-16 DIAGNOSIS — G43909 Migraine, unspecified, not intractable, without status migrainosus: Secondary | ICD-10-CM | POA: Diagnosis not present

## 2021-02-16 DIAGNOSIS — G629 Polyneuropathy, unspecified: Secondary | ICD-10-CM | POA: Diagnosis not present

## 2021-02-16 DIAGNOSIS — K5901 Slow transit constipation: Secondary | ICD-10-CM | POA: Diagnosis not present

## 2021-02-16 DIAGNOSIS — F419 Anxiety disorder, unspecified: Secondary | ICD-10-CM | POA: Diagnosis not present

## 2021-02-26 ENCOUNTER — Other Ambulatory Visit (HOSPITAL_COMMUNITY): Payer: Self-pay | Admitting: Internal Medicine

## 2021-03-08 DIAGNOSIS — G629 Polyneuropathy, unspecified: Secondary | ICD-10-CM | POA: Diagnosis not present

## 2021-03-08 DIAGNOSIS — K5901 Slow transit constipation: Secondary | ICD-10-CM | POA: Diagnosis not present

## 2021-03-08 DIAGNOSIS — G43909 Migraine, unspecified, not intractable, without status migrainosus: Secondary | ICD-10-CM | POA: Diagnosis not present

## 2021-03-08 DIAGNOSIS — F419 Anxiety disorder, unspecified: Secondary | ICD-10-CM | POA: Diagnosis not present

## 2021-03-27 IMAGING — MR MR CARD MORPHOLOGY WO/W CM
23 series · 40 of 40 positions shown · IV contrast (Contrast agent)
Comparison: none

CLINICAL DATA: Evaluate for viability

EXAM:
CARDIAC MRI
TECHNIQUE: The patient was scanned on a 1.5 Tesla Siemens magnet. A dedicated
cardiac coil was used. Functional imaging was done using [employer]iest[employer]
sequences. [DATE], and 4 chamber views were done to assess for RWMA's.
Modified Efigenia rule using a short axis stack was used to
calculate an ejection fraction on a dedicated work station using
Circle software. The patient received 10 cc of Gadavist. After 10
minutes inversion recovery sequences were used to assess for
infiltration and scar tissue.
CONTRAST:  10 cc  of Gadavist

[Series 6: bSSFP · oblique · 8.0mm · 1.52mm/px · 2 of 25 slices shown (1 of 13)]
[im 1/25]
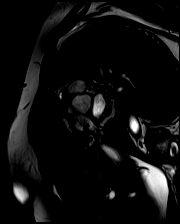
[im 25/25]
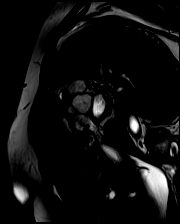

[Series 6: bSSFP · oblique · 8.0mm · 1.52mm/px · 2 of 25 slices shown (2 of 13)]
[im 1/25]
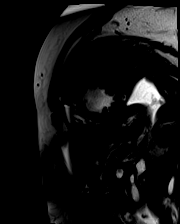
[im 25/25]
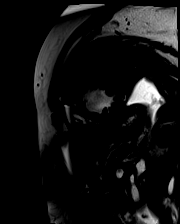

[Series 6: bSSFP · oblique · 8.0mm · 1.52mm/px · 2 of 25 slices shown (3 of 13)]
[im 1/25]
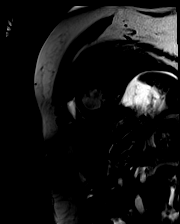
[im 25/25]
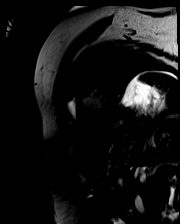

[Series 6: bSSFP · oblique · 8.0mm · 1.52mm/px · 2 of 25 slices shown (4 of 13)]
[im 1/25]
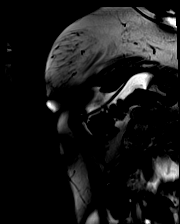
[im 25/25]
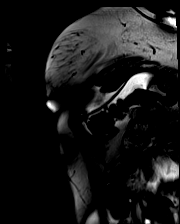

[Series 6: bSSFP · oblique · 8.0mm · 1.52mm/px · 2 of 25 slices shown (5 of 13)]
[im 1/25]
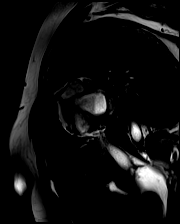
[im 25/25]
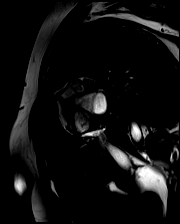

[Series 6: bSSFP · oblique · 8.0mm · 1.52mm/px · 2 of 25 slices shown (6 of 13)]
[im 1/25]
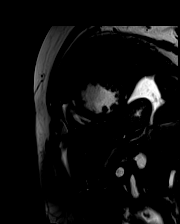
[im 25/25]
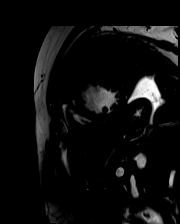

[Series 6: bSSFP · oblique · 8.0mm · 1.52mm/px · 2 of 25 slices shown (7 of 13)]
[im 1/25]
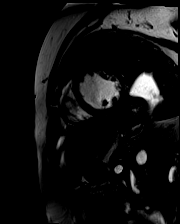
[im 25/25]
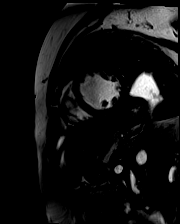

[Series 6: bSSFP · oblique · 8.0mm · 1.52mm/px · 3 of 25 slices shown (8 of 13)]
[im 1/25]
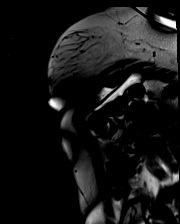
[im 13/25]
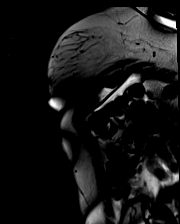
[im 25/25]
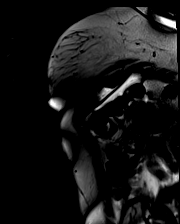

[Series 6: bSSFP · oblique · 8.0mm · 1.52mm/px · 2 of 25 slices shown (9 of 13)]
[im 1/25]
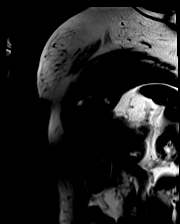
[im 25/25]
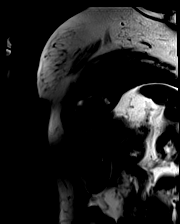

[Series 7: t2_trufi_tra_p2_bh · axial · 8.0mm · 0.62mm/px · z∈[-112,+78]mm · 2 of 20 slices shown]
[im 1/20]
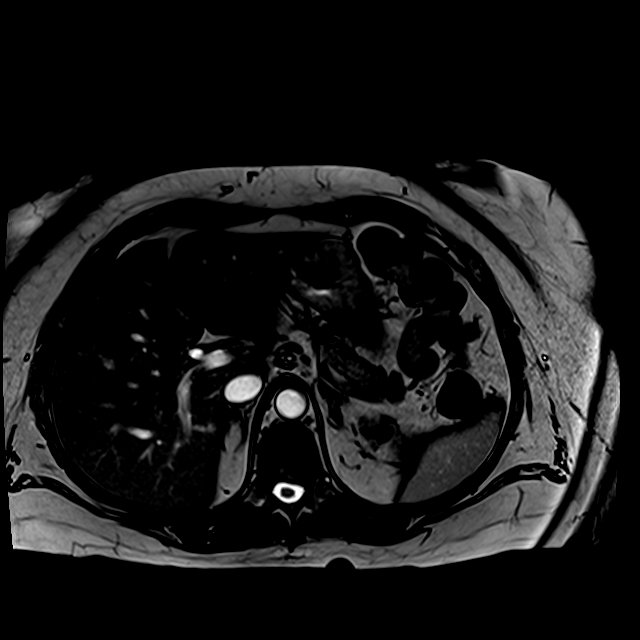
[im 20/20]
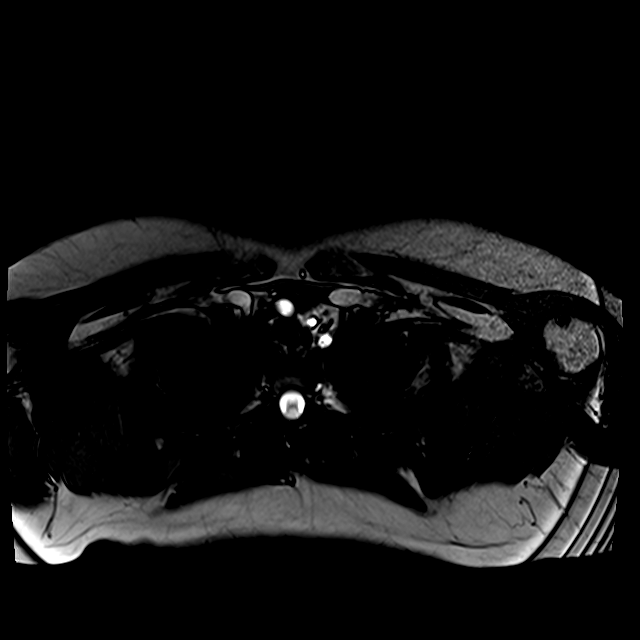

[Series 11: (id)_long_t1 · oblique · 8.0mm · 1.41mm/px · 2 of 24 slices shown]
[im 1/24]
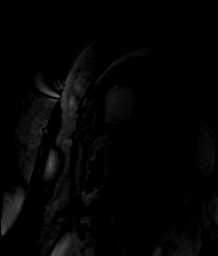
[im 24/24]
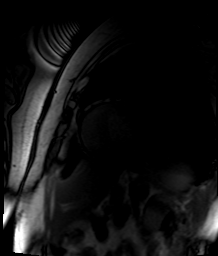

[Series 12: (id)_long_t1_moco · oblique · 8.0mm · 1.41mm/px · 2 of 24 slices shown]
[im 1/24]
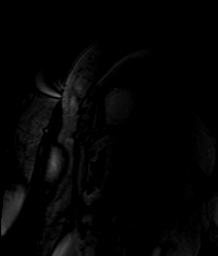
[im 24/24]
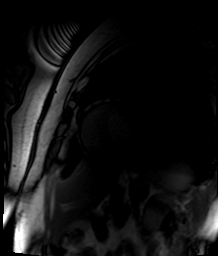

[Series 18: t2_stir_db_radial ((date)ch) · oblique · 6.0mm · 1.73mm/px · 1 of 3 slices shown]
[im 1/3]
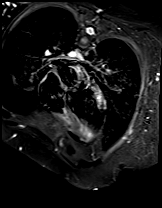

[Series 19: bSSFP · oblique · 6.0mm · 1.41mm/px · 2 of 25 slices shown (10 of 13)]
[im 1/25]
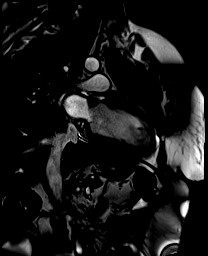
[im 25/25]
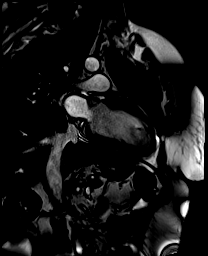

[Series 21: (id)_trufi · oblique · 8.0mm · 1.88mm/px · 1 of 9 slices shown]
[im 1/9]
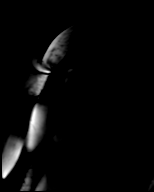

[Series 23: (id)_trufi_moco_t2 · oblique · 8.0mm · 1.88mm/px · 1 of 3 slices shown]
[im 1/3]
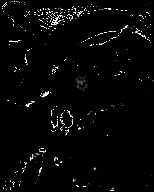

[Series 25: bSSFP · oblique · 8.0mm · 1.61mm/px · 2 of 25 slices shown (11 of 13)]
[im 1/25]
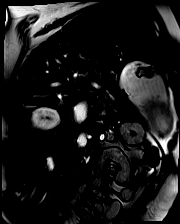
[im 25/25]
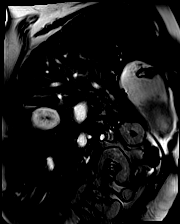

[Series 27: bSSFP · oblique · 8.0mm · 1.61mm/px · 2 of 25 slices shown (12 of 13)]
[im 1/25]
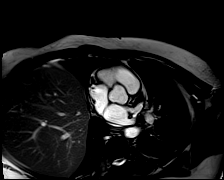
[im 25/25]
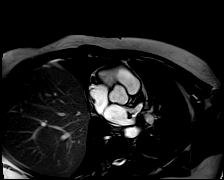

[Series 28: bSSFP · sagittal · 8.0mm · 1.61mm/px · 2 of 25 slices shown (13 of 13)]
[im 1/25]
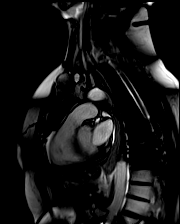
[im 25/25]
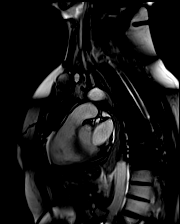

[Series 37: lge_single shot sa · oblique · 8.0mm · 1.98mm/px · 1 of 17 slices shown]
[im 1/17]
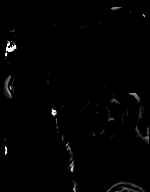

[Series 48: (id)_short_t1_moco_t1 · oblique · 8.0mm · 1.41mm/px · 1 of 6 slices shown]
[im 1/6]
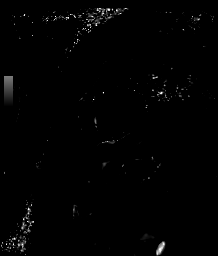

[Series 51: lge short axis_mag · oblique · 8.0mm · 1.61mm/px · 1 of 17 slices shown]
[im 1/17]
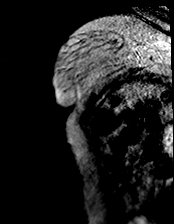

[Series 52: lge short axis_psir · oblique · 8.0mm · 1.61mm/px · 1 of 17 slices shown]
[im 1/17]
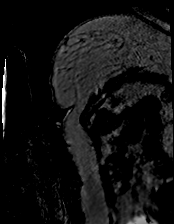

[40 of 40 positions shown; findings below may reference images not displayed]

FINDINGS: Left ventricle:

-Normal size

-Severe systolic dysfunction. Basal to apical anterior/anteroseptal
and apex akinesis

-ECV elevated (34%)

-Apical thrombus measuring 9mm x 7mm

-Subendocardial LGE in following distribution:

Basal anterior: 50-75% transmural

Basal anteroseptal: 25-50%

Mid anterior: 50-75%

Mid anteroseptal: 25-50%

Apical anterior: 50-75%

Apical septal: 25-50%

Apex: 75-100%

LV EF:  26% (Normal 56-78%)

Absolute volumes:

LV EDV: 158mL (Normal 52-141 mL)

LV ESV: 118mL (Normal 13-51 mL)

LV SV: 41mL (Normal 33-97 mL)

CO: 2.7L/min (Normal 2.7-6.0 L/min)

Indexed volumes:

LV EDV: 79mL/sq-m (Normal 41-81 mL/sq-m)

LV ESV: 59mL/sq-m (Normal 12-21 mL/sq-m)

LV SV: 20mL/sq-m (Normal 26-56 mL/sq-m)

CI: 1.3L/min/sq-m (Normal 1.8-3.8 L/min/sq-m)

Right ventricle: Small size.  Normal systolic function.

RV EF: 62% (Normal 47-80%)

Absolute volumes:

RV EDV: 63mL (Normal 58-154 mL)

RV ESV: 24mL (Normal 12-68 mL)

RV SV: 39mL (Normal 35-98 mL)

CO: 2.6L/min (Normal 2.7-6 L/min)

Indexed volumes:

RV EDV: 31mL/sq-m (Normal 48-87 mL/sq-m)

RV ESV: 12mL/sq-m (Normal 11-28 mL/sq-m)

RV SV: 20mL/sq-m (Normal 27-57 mL/sq-m)

CI: 1.3L/min/sq-m (Normal 1.8-3.8 L/min/sq-m)

Left atrium: Mild enlargement

Right atrium: Normal size

Mitral valve: No regurgitation

Aortic valve: No regurgitation

Tricuspid valve: No regurgitation

Pulmonic valve: No regurgitation

Pericardium: Small effusion
IMPRESSION: 1. Subendocardial late gadolinium enhancement consistent with prior
infarct in the LAD territory. There is >50% transmural LGE
suggesting nonviability in the basal to apical anterior wall and
apex. There is <50% transmural LGE suggesting viability in the basal
to mid anteroseptum and apical septum

2.  LV apical thrombus measuring 9mm x 7mm

3. Normal LV size with severe systolic dysfunction (EF 26%).
Akinesis of basal to apical anterior/anteroseptal walls and apex

4.  Small RV size with normal systolic function (EF 62%)

## 2021-04-06 ENCOUNTER — Ambulatory Visit (HOSPITAL_COMMUNITY)
Admission: RE | Admit: 2021-04-06 | Discharge: 2021-04-06 | Disposition: A | Discharge status: Home / Self Care | Payer: PPO | Source: Ambulatory Visit | Attending: Family Medicine | Admitting: Family Medicine

## 2021-04-06 ENCOUNTER — Other Ambulatory Visit: Payer: Self-pay

## 2021-04-06 ENCOUNTER — Encounter (HOSPITAL_COMMUNITY): Payer: Self-pay | Admitting: Internal Medicine

## 2021-04-06 ENCOUNTER — Ambulatory Visit (HOSPITAL_BASED_OUTPATIENT_CLINIC_OR_DEPARTMENT_OTHER)
Admission: RE | Admit: 2021-04-06 | Discharge: 2021-04-06 | Disposition: A | Discharge status: Home / Self Care | Payer: PPO | Source: Ambulatory Visit | Attending: Internal Medicine | Admitting: Internal Medicine

## 2021-04-06 VITALS — BP 104/70 | HR 89 | Wt 176.2 lb

## 2021-04-06 DIAGNOSIS — Z79899 Other long term (current) drug therapy: Secondary | ICD-10-CM | POA: Diagnosis not present

## 2021-04-06 DIAGNOSIS — R079 Chest pain, unspecified: Secondary | ICD-10-CM | POA: Insufficient documentation

## 2021-04-06 DIAGNOSIS — N181 Chronic kidney disease, stage 1: Secondary | ICD-10-CM

## 2021-04-06 DIAGNOSIS — I110 Hypertensive heart disease with heart failure: Secondary | ICD-10-CM | POA: Insufficient documentation

## 2021-04-06 DIAGNOSIS — Z7984 Long term (current) use of oral hypoglycemic drugs: Secondary | ICD-10-CM | POA: Insufficient documentation

## 2021-04-06 DIAGNOSIS — I2510 Atherosclerotic heart disease of native coronary artery without angina pectoris: Secondary | ICD-10-CM | POA: Diagnosis not present

## 2021-04-06 DIAGNOSIS — F17290 Nicotine dependence, other tobacco product, uncomplicated: Secondary | ICD-10-CM | POA: Insufficient documentation

## 2021-04-06 DIAGNOSIS — I255 Ischemic cardiomyopathy: Secondary | ICD-10-CM | POA: Diagnosis not present

## 2021-04-06 DIAGNOSIS — I5022 Chronic systolic (congestive) heart failure: Secondary | ICD-10-CM

## 2021-04-06 DIAGNOSIS — Z7902 Long term (current) use of antithrombotics/antiplatelets: Secondary | ICD-10-CM | POA: Diagnosis not present

## 2021-04-06 DIAGNOSIS — I493 Ventricular premature depolarization: Secondary | ICD-10-CM | POA: Insufficient documentation

## 2021-04-06 DIAGNOSIS — E782 Mixed hyperlipidemia: Secondary | ICD-10-CM | POA: Diagnosis not present

## 2021-04-06 DIAGNOSIS — E785 Hyperlipidemia, unspecified: Secondary | ICD-10-CM | POA: Diagnosis not present

## 2021-04-06 DIAGNOSIS — E119 Type 2 diabetes mellitus without complications: Secondary | ICD-10-CM | POA: Diagnosis not present

## 2021-04-06 DIAGNOSIS — Z7982 Long term (current) use of aspirin: Secondary | ICD-10-CM | POA: Insufficient documentation

## 2021-04-06 DIAGNOSIS — I252 Old myocardial infarction: Secondary | ICD-10-CM | POA: Diagnosis not present

## 2021-04-06 DIAGNOSIS — E1122 Type 2 diabetes mellitus with diabetic chronic kidney disease: Secondary | ICD-10-CM | POA: Diagnosis not present

## 2021-04-06 LAB — ECHOCARDIOGRAM COMPLETE
Area-P 1/2: 3.51 cm2
S' Lateral: 2.8 cm
Single Plane A4C EF: 50.7 %

## 2021-04-06 NOTE — Progress Notes (Signed)
  Echocardiogram 2D Echocardiogram has been performed.  Julie Hernandez 04/06/2021, 10:25 AM

## 2021-04-06 NOTE — Addendum Note (Signed)
Encounter addended by: Suezanne Cheshire, RN on: 04/06/2021 11:20 AM  Actions taken: Visit diagnoses modified, Order list changed, Diagnosis association updated, Clinical Note Signed

## 2021-04-06 NOTE — H&P (View-Only) (Signed)
Cardiology Clinic Note   Date:  01/02/2021   ID:  Julie Hernandez, DOB January 06, 1971, MRN [id number]  Location: Home  Provider location: [facility] Type of Visit: Established patient  PCP:  Truett Mainland, FNP  Cardiologist:  Glori Bickers, MD Primary HF: Raelle Chambers  Chief Complaint: Heart Failure follow-up   History of Present Illness:  Devanshi is a 50 y.o. woman with COPD/ongoing tobacco abuse, premature CAD s/p anterior MI, diet-controlled DM2, chronic back pain, and systolic HF due to [facility] referred for further evaluation of CAD and systolic HF.  Reports she has failed all statins due to intolerance. Has been on Zetia. Also has DM2 and says she previously was on insulin but now controls it with diet.   Had anterior MI in 2010 taken emergently to [facility]. Stent was placed emergently. Post stenting she continued to have CP. Went to [facility] that same year and had a a second stent placed. Did well from a cardiac perspective until 3/21.   Cath 02/08/20 at [facility] for NSTEMI. 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 Was told she may need repeat stenting vs CABG. Referred here for second opinion.   We saw her in 5/21 with daily angina. Underwent MRI which showed EF 26% only minimal viability in anterior wall. Decision to proceed with PCI of LCX and LAD (ISR) over CABG. Had successful PCI of LCX and LAD with Dr. Burt Knack on 5/20.   Echo 9/21 EF 60-65%  Echo today 04/06/21 EF 50-55% mild anterior and apical HK  Here for f/u. Says she is starting to have some CP. Will come and go fairly quickly. Has happened everyday for past few days. NTG helps. Not related to exertion. No clear trigger. Still vaping. No edema, orthopnea or PND.    Studies:  Zio 7/31 1. Sinus rhythm - avg HR of 82 2. 13 Ventricular  Tachycardia runs occurred, the run with the fastest interval lasting 12 beats with a max rate of 139 bpm (avg 110 bpm); the run with the fastest interval was also the longest 3. Very frequents PVCs (32.2%, [id number]), VE Couplets were rare (<1.0%, 2118),  4. Ventricular Bigeminy and Trigeminy were present. 5.. Multiple patient-triggered events associated  cMRI 03/29/20 1. Subendocardial late gadolinium enhancement consistent with prior infarct in the LAD territory. There is >50% transmural LGE suggesting nonviability in the basal to apical anterior wall and apex. There is <50% transmural LGE suggesting viability in the basal to mid anteroseptum and apical septum  2.  LV apical thrombus measuring 55m x 787m 3. Normal LV size with severe systolic dysfunction (EF 2625% Akinesis of basal to apical anterior/anteroseptal walls and apex  4.  Small RV size with normal systolic function (EF 6285%  Past Medical History:  Diagnosis Date  . [place]congestive [place]  . Coronary artery disease   . COVID 12/03/2020  . Diabetes mellitus without complication ([place]  . Hypertension     Current Outpatient Medications  Medication Sig Dispense Refill  . albuterol (VENTOLIN HFA) 108 (90 Base) MCG/ACT inhaler Inhale 2 puffs into the lungs every 4 (four) hours as needed for wheezing or shortness of breath.    . Marland Kitchenspirin EC 81 MG tablet Take 81 mg by mouth daily.    . blood glucose  meter kit and supplies Dispense based on patient and insurance preference. Use up to four times daily as directed. (FOR ICD-10 E10.9, E11.9). 1 each 1  . carvedilol (COREG) 6.25 MG tablet Take 1 tablet (6.25 mg total) by mouth 2 (two) times daily with a meal. 60 tablet 3  . clopidogrel (PLAVIX) 75 MG tablet Take 75 mg by mouth daily.    . empagliflozin (JARDIANCE) 10 MG TABS tablet Take 1 tablet (10 mg total) by mouth daily. 30 tablet 11  . Evolocumab (REPATHA SURECLICK) 482 MG/ML SOAJ Inject 1 pen into the skin  every 14 (fourteen) days. 2 pen 11  . ezetimibe (ZETIA) 10 MG tablet Take 1 tablet (10 mg total) by mouth daily at 6pm. 30 tablet 11  . famotidine (PEPCID) 20 MG tablet Take 20 mg by mouth 2 (two) times daily.    . furosemide (LASIX) 20 MG tablet Take 2 tablets (40 mg total) by mouth every other day. 60 tablet 11  . insulin glargine (LANTUS) 100 UNIT/ML Solostar Pen Inject 8 Units into the skin daily. Further refills by PCP 15 mL 0  . lisinopril (ZESTRIL) 5 MG tablet Take 5 mg by mouth daily.    . metFORMIN (GLUCOPHAGE) 500 MG tablet Take 2 tablets (1,000 mg total) by mouth 2 (two) times daily with a meal. 360 tablet 3  . nitroGLYCERIN (NITROSTAT) 0.4 MG SL tablet Place 0.4 mg under the tongue every 5 (five) minutes as needed for chest pain.    Marland Kitchen ondansetron (ZOFRAN ODT) 4 MG disintegrating tablet Take 1 tablet (4 mg total) by mouth every 8 (eight) hours as needed for nausea or vomiting. 10 tablet 0  . RABEprazole (ACIPHEX) 20 MG tablet Take 40 mg by mouth daily.    Marland Kitchen spironolactone (ALDACTONE) 25 MG tablet Take 0.5 tablets (12.5 mg total) by mouth daily. 45 tablet 3   No current facility-administered medications for this encounter.    Allergies  Allergen Reactions  . Esomeprazole Magnesium Anaphylaxis    unkn  . Atorvastatin   . Ciprofibrate Itching  . Ciprofloxacin Itching  . Gabapentin     Achy  . Lubiprostone Diarrhea  . Statins     Flu like symptoms  . Amlodipine Rash  . Latex Rash and Swelling  . Other Rash and Swelling      Social History   Socioeconomic History  . Marital status: Divorced    Spouse name: Not on file  . Number of children: 2  . Years of education: Not on file  . Highest education level: Not on file  Occupational History  . Occupation: On disability  Tobacco Use  . Smoking status: Former Smoker    Quit date: 02/18/2020    Years since quitting: 1.1  . Smokeless tobacco: Never Used  Vaping Use  . Vaping Use: Some days  Substance and Sexual Activity   . Alcohol use: Yes    Comment: SOCIAL  . Drug use: Never  . Sexual activity: Not on file  Other Topics Concern  . Not on file  Social History Narrative  . Not on file   Social Determinants of Health   Financial Resource Strain: Not on file  Food Insecurity: Not on file  Transportation Needs: Not on file  Physical Activity: Not on file  Stress: Not on file  Social Connections: Not on file  Intimate Partner Violence: Not on file    FHx:  M died at 81 from MI and HF D had stroke No brother  and sisters  Vitals:   04/06/21 1026  BP: 104/70  Pulse: 89  SpO2: 98%  Weight: 79.9 kg (176 lb 3.2 oz)     Exam:   General:  Well appearing. No resp difficulty HEENT: normal Neck: supple. no JVD. Carotids 2+ bilat; no bruits. No lymphadenopathy or thryomegaly appreciated. Cor: PMI nondisplaced. Regular rate & rhythm. No rubs, gallops or murmurs. Lungs: clear Abdomen: soft, nontender, nondistended. No hepatosplenomegaly. No bruits or masses. Good bowel sounds. Extremities: no cyanosis, clubbing, rash, edema Neuro: alert & orientedx3, cranial nerves grossly intact. moves all 4 extremities w/o difficulty. Affect pleasant   Recent Labs: 04/08/2020: Hemoglobin 13.0; Platelets 242 06/21/2020: ALT 13 07/26/2020: B Natriuretic Peptide 62.7 09/28/2020: BUN 13; Creatinine, Ser 0.91; Magnesium 2.2; Potassium 4.8; Sodium 141  Personally reviewed   Wt Readings from Last 3 Encounters:  09/28/20 79.7 kg (175 lb 12.8 oz)  07/26/20 78.6 kg (173 lb 3.2 oz)  05/12/20 76.7 kg (169 lb)    ECG: NSR 85 LVH septal Qs. No acute ST-T changes Personally reviewed   ASSESSMENT & PLAN:  1. CAD with recurrent chest pressure - s/p PCI of LCX and LAD (ISR) in 5/21. 50% mRCA lesion  - Has has CP over past few days. Fairly typical in nature but not exertional. We discussed possibility of repeat coronary angiography. She will follow symptoms for next few days. If not improving or getting worse will plan  repeat cath in near future. Call 911 for severe symptoms.  - Continue ASA/Plavix - She has been intolerant of all statin. Recent LDL 54 on Repatha/Zetia. Follows with Lipid Clinic  2. 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 dbpr - Stable NYHA II. Volume status ok on lasix 40 qod.  - Continue carvedilol to 6.25 bid - Continue Jardiance - Continue spiro 12.5 - Continue lisinopril 5. Can consider switch to [facility] as BP tolerates   3. LV apical clot - resolved one cho today  4. Hyperlipidemia - Intolerant of all statins. On repatha/zetia  - last LDL 54 8/21 - Follows with Lipid Clinic. Will need to schedule f/u  5. DM2 - HgBa1c previously 11.0  - On Jardiance.  - Following with Dr. Monna Fam.  6. Tobacco use - off cigarettes but now vaping. - encouraged cessation   7. Frequent PVCs - Zio 7/31 with 32% PVCs. - Resolved   Glori Bickers, MD  10:52 AM

## 2021-04-06 NOTE — Progress Notes (Signed)
 Cardiology Clinic Note   Date:  01/02/2021   ID:  Julie Hernandez, DOB 12/31/1970, MRN [id number]  Location: Home  Provider location: [facility] [facility] Type of Visit: Established patient  PCP:  Ballard, Marcia H, FNP  Cardiologist:  Norberto Wishon, MD Primary HF: Jenese Mischke  Chief Complaint: Heart Failure follow-up   History of Present Illness:  Julie Hernandez is a 49 y.o. 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  Reports she has failed all statins due to intolerance. Has been on Zetia. Also has DM2 and says she previously was on insulin but now controls it with diet.   Had anterior MI in 2010 taken emergently to [facility]. Stent was placed emergently. Post stenting she continued to have CP. Went to [place] that same year and had a a second stent placed. Did well from a cardiac perspective until 3/21.   Cath 02/08/20 at [facility] for NSTEMI. 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 Was told she may need repeat stenting vs CABG. Referred here for second opinion.   We saw her in 5/21 with daily angina. Underwent MRI which showed EF 26% only minimal viability in anterior wall. Decision to proceed with PCI of LCX and LAD (ISR) over CABG. Had successful PCI of LCX and LAD with Dr. Cooper on 5/20.   Echo 9/21 EF 60-65%  Echo today 04/06/21 EF 50-55% mild anterior and apical HK  Here for f/u. Says she is starting to have some CP. Will come and go fairly quickly. Has happened everyday for past few days. NTG helps. Not related to exertion. No clear trigger. Still vaping. No edema, orthopnea or PND.    Studies:  Zio 7/31 1. Sinus rhythm - avg HR of 82 2. 13 Ventricular  Tachycardia runs occurred, the run with the fastest interval lasting 12 beats with a max rate of 139 bpm (avg 110 bpm); the run with the fastest interval was also the longest 3. Very frequents PVCs (32.2%, [id number]), VE Couplets were rare (<1.0%, 2118),  4. Ventricular Bigeminy and Trigeminy were present. 5.. Multiple patient-triggered events associated  cMRI 03/29/20 1. Subendocardial late gadolinium enhancement consistent with prior infarct in the LAD territory. There is >50% transmural LGE suggesting nonviability in the basal to apical anterior wall and apex. There is <50% transmural LGE suggesting viability in the basal to mid anteroseptum and apical septum  2.  LV apical thrombus measuring 9mm x 7mm  3. Normal LV size with severe systolic dysfunction (EF 26%). Akinesis of basal to apical anterior/anteroseptal walls and apex  4.  Small RV size with normal systolic function (EF 62%)   Past Medical History:  Diagnosis Date  . CHF (congestive heart failure) (H[facility])   . Coronary artery disease   . COVID 12/03/2020  . Diabetes mellitus without complication (HCC)   . Hypertension     Current Outpatient Medications  Medication Sig Dispense Refill  . albuterol (VENTOLIN HFA) 108 (90 Base) MCG/ACT inhaler Inhale 2 puffs into the lungs every 4 (four) hours as needed for wheezing or shortness of breath.    . aspirin EC 81 MG tablet Take 81 mg by mouth daily.    . blood glucose   meter kit and supplies Dispense based on patient and insurance preference. Use up to four times daily as directed. (FOR ICD-10 E10.9, E11.9). 1 each 1  . carvedilol (COREG) 6.25 MG tablet Take 1 tablet (6.25 mg total) by mouth 2 (two) times daily with a meal. 60 tablet 3  . clopidogrel (PLAVIX) 75 MG tablet Take 75 mg by mouth daily.    . empagliflozin (JARDIANCE) 10 MG TABS tablet Take 1 tablet (10 mg total) by mouth daily. 30 tablet 11  . Evolocumab (REPATHA SURECLICK) 140 MG/ML SOAJ Inject 1 pen into the skin  every 14 (fourteen) days. 2 pen 11  . ezetimibe (ZETIA) 10 MG tablet Take 1 tablet (10 mg total) by mouth daily at 6pm. 30 tablet 11  . famotidine (PEPCID) 20 MG tablet Take 20 mg by mouth 2 (two) times daily.    . furosemide (LASIX) 20 MG tablet Take 2 tablets (40 mg total) by mouth every other day. 60 tablet 11  . insulin glargine (LANTUS) 100 UNIT/ML Solostar Pen Inject 8 Units into the skin daily. Further refills by PCP 15 mL 0  . lisinopril (ZESTRIL) 5 MG tablet Take 5 mg by mouth daily.    . metFORMIN (GLUCOPHAGE) 500 MG tablet Take 2 tablets (1,000 mg total) by mouth 2 (two) times daily with a meal. 360 tablet 3  . nitroGLYCERIN (NITROSTAT) 0.4 MG SL tablet Place 0.4 mg under the tongue every 5 (five) minutes as needed for chest pain.    . ondansetron (ZOFRAN ODT) 4 MG disintegrating tablet Take 1 tablet (4 mg total) by mouth every 8 (eight) hours as needed for nausea or vomiting. 10 tablet 0  . RABEprazole (ACIPHEX) 20 MG tablet Take 40 mg by mouth daily.    . spironolactone (ALDACTONE) 25 MG tablet Take 0.5 tablets (12.5 mg total) by mouth daily. 45 tablet 3   No current facility-administered medications for this encounter.    Allergies  Allergen Reactions  . Esomeprazole Magnesium Anaphylaxis    unkn  . Atorvastatin   . Ciprofibrate Itching  . Ciprofloxacin Itching  . Gabapentin     Achy  . Lubiprostone Diarrhea  . Statins     Flu like symptoms  . Amlodipine Rash  . Latex Rash and Swelling  . Other Rash and Swelling      Social History   Socioeconomic History  . Marital status: Divorced    Spouse name: Not on file  . Number of children: 2  . Years of education: Not on file  . Highest education level: Not on file  Occupational History  . Occupation: On disability  Tobacco Use  . Smoking status: Former Smoker    Quit date: 02/18/2020    Years since quitting: 1.1  . Smokeless tobacco: Never Used  Vaping Use  . Vaping Use: Some days  Substance and Sexual Activity   . Alcohol use: Yes    Comment: SOCIAL  . Drug use: Never  . Sexual activity: Not on file  Other Topics Concern  . Not on file  Social History Narrative  . Not on file   Social Determinants of Health   Financial Resource Strain: Not on file  Food Insecurity: Not on file  Transportation Needs: Not on file  Physical Activity: Not on file  Stress: Not on file  Social Connections: Not on file  Intimate Partner Violence: Not on file    FHx:  M died at 51 from MI and HF D had stroke No brother   and sisters  Vitals:   04/06/21 1026  BP: 104/70  Pulse: 89  SpO2: 98%  Weight: 79.9 kg (176 lb 3.2 oz)     Exam:   General:  Well appearing. No resp difficulty HEENT: normal Neck: supple. no JVD. Carotids 2+ bilat; no bruits. No lymphadenopathy or thryomegaly appreciated. Cor: PMI nondisplaced. Regular rate & rhythm. No rubs, gallops or murmurs. Lungs: clear Abdomen: soft, nontender, nondistended. No hepatosplenomegaly. No bruits or masses. Good bowel sounds. Extremities: no cyanosis, clubbing, rash, edema Neuro: alert & orientedx3, cranial nerves grossly intact. moves all 4 extremities w/o difficulty. Affect pleasant   Recent Labs: 04/08/2020: Hemoglobin 13.0; Platelets 242 06/21/2020: ALT 13 07/26/2020: B Natriuretic Peptide 62.7 09/28/2020: BUN 13; Creatinine, Ser 0.91; Magnesium 2.2; Potassium 4.8; Sodium 141  Personally reviewed   Wt Readings from Last 3 Encounters:  09/28/20 79.7 kg (175 lb 12.8 oz)  07/26/20 78.6 kg (173 lb 3.2 oz)  05/12/20 76.7 kg (169 lb)    ECG: NSR 85 LVH septal Qs. No acute ST-T changes Personally reviewed   ASSESSMENT & PLAN:  1. CAD with recurrent chest pressure - s/p PCI of LCX and LAD (ISR) in 5/21. 50% mRCA lesion  - Has has CP over past few days. Fairly typical in nature but not exertional. We discussed possibility of repeat coronary angiography. She will follow symptoms for next few days. If not improving or getting worse will plan  repeat cath in near future. Call 911 for severe symptoms.  - Continue ASA/Plavix - She has been intolerant of all statin. Recent LDL 54 on Repatha/Zetia. Follows with Lipid Clinic  2. 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 dbpr - Stable NYHA II. Volume status ok on lasix 40 qod.  - Continue carvedilol to 6.25 bid - Continue Jardiance - Continue spiro 12.5 - Continue lisinopril 5. Can consider switch to Entresto as BP tolerates   3. LV apical clot - resolved one cho today  4. Hyperlipidemia - Intolerant of all statins. On repatha/zetia  - last LDL 54 8/21 - Follows with Lipid Clinic. Will need to schedule f/u  5. DM2 - HgBa1c previously 11.0  - On Jardiance.  - Following with Dr. Gehrge.  6. Tobacco use - off cigarettes but now vaping. - encouraged cessation   7. Frequent PVCs - Zio 7/31 with 32% PVCs. - Resolved   Tex Conroy, MD  10:52 AM    

## 2021-04-06 NOTE — Patient Instructions (Signed)
If chest pain persists call clinic  If chest pain becomes severe call 911  You have been referred to Lipid Clinic, They will call you to schedule an appointment  Your physician recommends that you schedule a follow-up appointment in: 3-4 months   If you have any questions or concerns before your next appointment please send [place] a message through [facility] or call our office at [phone].    TO LEAVE A MESSAGE FOR THE NURSE SELECT OPTION 2, PLEASE LEAVE A MESSAGE INCLUDING: . YOUR NAME . DATE OF BIRTH . CALL BACK NUMBER . REASON FOR CALL**this is important as we prioritize the call backs  YOU WILL RECEIVE A CALL BACK THE SAME DAY AS LONG AS YOU CALL BEFORE 4:00 PM  At the [facility], you and your health needs are our priority. As part of our continuing mission to provide you with exceptional heart care, we have created designated Provider Care Teams. These Care Teams include your primary Cardiologist (physician) and Advanced Practice Providers (APPs- Physician Assistants and Nurse Practitioners) who all work together to provide you with the care you need, when you need it.   You may see any of the following providers on your designated Care Team at your next follow up: Marland Kitchen Dr Arvilla Meres . Dr Marca Ancona . Dr Thornell Mule . Tonye Becket, NP . Robbie Lis, PA . Shanda Bumps [place],NP . Karle Plumber, [name]   Please be sure to bring in all your medications bottles to every appointment.

## 2021-04-11 ENCOUNTER — Encounter (HOSPITAL_COMMUNITY): Payer: Self-pay

## 2021-04-12 ENCOUNTER — Encounter (HOSPITAL_COMMUNITY): Payer: Self-pay | Admitting: *Deleted

## 2021-04-13 ENCOUNTER — Other Ambulatory Visit (HOSPITAL_COMMUNITY): Payer: Self-pay | Admitting: *Deleted

## 2021-04-13 DIAGNOSIS — R079 Chest pain, unspecified: Secondary | ICD-10-CM

## 2021-04-13 DIAGNOSIS — I2510 Atherosclerotic heart disease of native coronary artery without angina pectoris: Secondary | ICD-10-CM

## 2021-04-14 ENCOUNTER — Encounter (HOSPITAL_COMMUNITY): Payer: Self-pay | Admitting: Internal Medicine

## 2021-04-14 ENCOUNTER — Other Ambulatory Visit: Payer: Self-pay

## 2021-04-14 ENCOUNTER — Encounter (HOSPITAL_COMMUNITY)
Admission: RE | Disposition: A | Discharge status: Home / Self Care | Payer: Self-pay | Source: Home / Self Care | Attending: Internal Medicine

## 2021-04-14 ENCOUNTER — Ambulatory Visit (HOSPITAL_COMMUNITY)
Admission: RE | Admit: 2021-04-14 | Discharge: 2021-04-14 | Disposition: A | Discharge status: Home / Self Care | Payer: PPO | Attending: Internal Medicine | Admitting: Internal Medicine

## 2021-04-14 DIAGNOSIS — I255 Ischemic cardiomyopathy: Secondary | ICD-10-CM | POA: Diagnosis not present

## 2021-04-14 DIAGNOSIS — Z91040 Latex allergy status: Secondary | ICD-10-CM | POA: Diagnosis not present

## 2021-04-14 DIAGNOSIS — Z79899 Other long term (current) drug therapy: Secondary | ICD-10-CM | POA: Diagnosis not present

## 2021-04-14 DIAGNOSIS — I5022 Chronic systolic (congestive) heart failure: Secondary | ICD-10-CM | POA: Insufficient documentation

## 2021-04-14 DIAGNOSIS — I110 Hypertensive heart disease with heart failure: Secondary | ICD-10-CM | POA: Insufficient documentation

## 2021-04-14 DIAGNOSIS — R079 Chest pain, unspecified: Secondary | ICD-10-CM

## 2021-04-14 DIAGNOSIS — E785 Hyperlipidemia, unspecified: Secondary | ICD-10-CM | POA: Insufficient documentation

## 2021-04-14 DIAGNOSIS — I2510 Atherosclerotic heart disease of native coronary artery without angina pectoris: Secondary | ICD-10-CM | POA: Insufficient documentation

## 2021-04-14 DIAGNOSIS — R0789 Other chest pain: Secondary | ICD-10-CM | POA: Diagnosis not present

## 2021-04-14 DIAGNOSIS — Z888 Allergy status to other drugs, medicaments and biological substances status: Secondary | ICD-10-CM | POA: Diagnosis not present

## 2021-04-14 DIAGNOSIS — Z7982 Long term (current) use of aspirin: Secondary | ICD-10-CM | POA: Diagnosis not present

## 2021-04-14 DIAGNOSIS — Z7902 Long term (current) use of antithrombotics/antiplatelets: Secondary | ICD-10-CM | POA: Diagnosis not present

## 2021-04-14 DIAGNOSIS — F17200 Nicotine dependence, unspecified, uncomplicated: Secondary | ICD-10-CM | POA: Diagnosis not present

## 2021-04-14 DIAGNOSIS — I493 Ventricular premature depolarization: Secondary | ICD-10-CM | POA: Diagnosis not present

## 2021-04-14 DIAGNOSIS — Z8249 Family history of ischemic heart disease and other diseases of the circulatory system: Secondary | ICD-10-CM | POA: Insufficient documentation

## 2021-04-14 DIAGNOSIS — Z881 Allergy status to other antibiotic agents status: Secondary | ICD-10-CM | POA: Diagnosis not present

## 2021-04-14 DIAGNOSIS — Z955 Presence of coronary angioplasty implant and graft: Secondary | ICD-10-CM | POA: Insufficient documentation

## 2021-04-14 DIAGNOSIS — Z7984 Long term (current) use of oral hypoglycemic drugs: Secondary | ICD-10-CM | POA: Diagnosis not present

## 2021-04-14 DIAGNOSIS — E119 Type 2 diabetes mellitus without complications: Secondary | ICD-10-CM | POA: Insufficient documentation

## 2021-04-14 HISTORY — PX: LEFT HEART CATH AND CORONARY ANGIOGRAPHY: CATH118249

## 2021-04-14 LAB — CBC
HCT: 45.1 % (ref 36.0–46.0)
Hemoglobin: 14 g/dL (ref 12.0–15.0)
MCH: 28.2 pg (ref 26.0–34.0)
MCHC: 31 g/dL (ref 30.0–36.0)
MCV: 90.7 fL (ref 80.0–100.0)
Platelets: 260 10*3/uL (ref 150–400)
RBC: 4.97 MIL/uL (ref 3.87–5.11)
RDW: 12.3 % (ref 11.5–15.5)
WBC: 7.4 10*3/uL (ref 4.0–10.5)
nRBC: 0 % (ref 0.0–0.2)

## 2021-04-14 LAB — BASIC METABOLIC PANEL
Anion gap: 7 (ref 5–15)
BUN: 9 mg/dL (ref 6–20)
CO2: 26 mmol/L (ref 22–32)
Calcium: 9.2 mg/dL (ref 8.9–10.3)
Chloride: 108 mmol/L (ref 98–111)
Creatinine, Ser: 0.86 mg/dL (ref 0.44–1.00)
GFR, Estimated: >60 mL/min (ref >60)
Glucose, Bld: 163 mg/dL — ABNORMAL HIGH (ref 70–99)
Potassium: 4.5 mmol/L (ref 3.5–5.1)
Sodium: 141 mmol/L (ref 135–145)

## 2021-04-14 LAB — GLUCOSE, CAPILLARY
Glucose-Capillary: 151 mg/dL — ABNORMAL HIGH (ref 70–99)
Glucose-Capillary: 155 mg/dL — ABNORMAL HIGH (ref 70–99)

## 2021-04-14 LAB — PREGNANCY, URINE: Preg Test, Ur: NEGATIVE

## 2021-04-14 SURGERY — LEFT HEART CATH AND CORONARY ANGIOGRAPHY
Anesthesia: LOCAL

## 2021-04-14 MED ORDER — FENTANYL CITRATE (PF) 100 MCG/2ML IJ SOLN
INTRAMUSCULAR | Status: AC
  Filled 2021-04-14: qty 2

## 2021-04-14 MED ORDER — SODIUM CHLORIDE 0.9% FLUSH: 3.0000 mL | INTRAVENOUS | Status: DC | PRN

## 2021-04-14 MED ORDER — HYDRALAZINE HCL 20 MG/ML IJ SOLN: 10.0000 mg | INTRAMUSCULAR | Status: DC | PRN

## 2021-04-14 MED ORDER — HEPARIN SODIUM (PORCINE) 1000 UNIT/ML IJ SOLN
INTRAMUSCULAR | Status: DC | PRN
  Administered 2021-04-14: 4000 [IU] via INTRAVENOUS

## 2021-04-14 MED ORDER — VERAPAMIL HCL 2.5 MG/ML IV SOLN
INTRAVENOUS | Status: AC
  Filled 2021-04-14: qty 2

## 2021-04-14 MED ORDER — MIDAZOLAM HCL 2 MG/2ML IJ SOLN
INTRAMUSCULAR | Status: AC
  Filled 2021-04-14: qty 2

## 2021-04-14 MED ORDER — SODIUM CHLORIDE 0.9 % IV SOLN: 250.0000 mL | INTRAVENOUS | Status: DC | PRN

## 2021-04-14 MED ORDER — HEPARIN (PORCINE) IN NACL 1000-0.9 UT/500ML-% IV SOLN
INTRAVENOUS | Status: AC
  Filled 2021-04-14: qty 1000

## 2021-04-14 MED ORDER — ONDANSETRON HCL 4 MG/2ML IJ SOLN
INTRAMUSCULAR | Status: AC
  Filled 2021-04-14: qty 2

## 2021-04-14 MED ORDER — LABETALOL HCL 5 MG/ML IV SOLN: 10.0000 mg | INTRAVENOUS | Status: DC | PRN

## 2021-04-14 MED ORDER — ONDANSETRON HCL 4 MG/2ML IJ SOLN: 4.0000 mg | Freq: Four times a day (QID) | INTRAMUSCULAR | Status: DC | PRN

## 2021-04-14 MED ORDER — VERAPAMIL HCL 2.5 MG/ML IV SOLN
INTRAVENOUS | Status: DC | PRN
  Administered 2021-04-14: 10 mL via INTRA_ARTERIAL

## 2021-04-14 MED ORDER — ASPIRIN 81 MG PO CHEW
81.0000 mg | CHEWABLE_TABLET | Freq: Once | ORAL | Status: AC
  Administered 2021-04-14: 81 mg via ORAL
  Filled 2021-04-14: qty 1

## 2021-04-14 MED ORDER — HEPARIN (PORCINE) IN NACL 1000-0.9 UT/500ML-% IV SOLN
INTRAVENOUS | Status: DC | PRN
  Administered 2021-04-14 (×2): 500 mL

## 2021-04-14 MED ORDER — LIDOCAINE HCL (PF) 1 % IJ SOLN
INTRAMUSCULAR | Status: DC | PRN
  Administered 2021-04-14 (×2): 2 mL

## 2021-04-14 MED ORDER — SODIUM CHLORIDE 0.9% FLUSH: 3.0000 mL | Freq: Two times a day (BID) | INTRAVENOUS | Status: DC

## 2021-04-14 MED ORDER — ONDANSETRON HCL 4 MG/2ML IJ SOLN
INTRAMUSCULAR | Status: DC | PRN
  Administered 2021-04-14: 4 mg via INTRAVENOUS

## 2021-04-14 MED ORDER — LIDOCAINE HCL (PF) 1 % IJ SOLN
INTRAMUSCULAR | Status: AC
  Filled 2021-04-14: qty 30

## 2021-04-14 MED ORDER — FENTANYL CITRATE (PF) 100 MCG/2ML IJ SOLN
INTRAMUSCULAR | Status: DC | PRN
  Administered 2021-04-14 (×2): 25 ug via INTRAVENOUS

## 2021-04-14 MED ORDER — SODIUM CHLORIDE 0.9% FLUSH
3.0000 mL | Freq: Two times a day (BID) | INTRAVENOUS | Status: DC
Start: 2021-04-14 — End: 2021-04-14

## 2021-04-14 MED ORDER — HEPARIN SODIUM (PORCINE) 1000 UNIT/ML IJ SOLN
INTRAMUSCULAR | Status: AC
  Filled 2021-04-14: qty 1

## 2021-04-14 MED ORDER — ACETAMINOPHEN 325 MG PO TABS: 650.0000 mg | ORAL_TABLET | ORAL | Status: DC | PRN

## 2021-04-14 MED ORDER — MIDAZOLAM HCL 2 MG/2ML IJ SOLN
INTRAMUSCULAR | Status: DC | PRN
  Administered 2021-04-14: 2 mg via INTRAVENOUS
  Administered 2021-04-14: 1 mg via INTRAVENOUS

## 2021-04-14 MED ORDER — SODIUM CHLORIDE 0.9 % IV SOLN: INTRAVENOUS | Status: DC

## 2021-04-14 MED ORDER — IOHEXOL 350 MG/ML SOLN
INTRAVENOUS | Status: DC | PRN
  Administered 2021-04-14: 20 mL

## 2021-04-14 SURGICAL SUPPLY — 9 items
CATH 5FR JL3.5 JR4 ANG PIG MP (CATHETERS) ×2 IMPLANT
DEVICE RAD TR BAND REGULAR (VASCULAR PRODUCTS) ×2 IMPLANT
GLIDESHEATH SLEND SS 6F .021 (SHEATH) ×2 IMPLANT
GUIDEWIRE INQWIRE 1.5J.035X260 (WIRE) ×1 IMPLANT
INQWIRE 1.5J .035X260CM (WIRE) ×2
KIT HEART LEFT (KITS) ×2 IMPLANT
PACK CARDIAC CATHETERIZATION (CUSTOM PROCEDURE TRAY) ×2 IMPLANT
TRANSDUCER W/STOPCOCK (MISCELLANEOUS) ×2 IMPLANT
TUBING CIL FLEX 10 FLL-RA (TUBING) ×2 IMPLANT

## 2021-04-14 NOTE — Interval H&P Note (Signed)
History and Physical Interval Note:  04/14/2021 11:56 AM  Julie Hernandez  has presented today for surgery, with the diagnosis of heart failure, chest pain.  The various methods of treatment have been discussed with the patient and family. After consideration of risks, benefits and other options for treatment, the patient has consented to  Procedure(s): LEFT HEART CATH AND CORONARY ANGIOGRAPHY (N/A) and possible coronary angioplasty as a surgical intervention.  The patient's history has been reviewed, patient examined, no change in status, stable for surgery.  I have reviewed the patient's chart and labs.  Questions were answered to the patient's satisfaction.     [name]

## 2021-04-14 NOTE — Discharge Instructions (Signed)
Radial Site Care  This sheet gives you information about how to care for yourself after your procedure. Your health care provider may also give you more specific instructions. If you have problems or questions, contact your health care provider. What can I expect after the procedure? After the procedure, it is common to have:  Bruising and tenderness at the catheter insertion area. Follow these instructions at home: Medicines  Take over-the-counter and prescription medicines only as told by your health care provider. Insertion site care  Follow instructions from your health care provider about how to take care of your insertion site. Make sure you: ? Wash your hands with soap and water before you change your bandage (dressing). If soap and water are not available, use hand sanitizer. ? Change your dressing as told by your health care provider. ? Leave stitches (sutures), skin glue, or adhesive strips in place. These skin closures may need to stay in place for 2 weeks or longer. If adhesive strip edges start to loosen and curl up, you may trim the loose edges. Do not remove adhesive strips completely unless your health care provider tells you to do that.  Check your insertion site every day for signs of infection. Check for: ? Redness, swelling, or pain. ? Fluid or blood. ? Pus or a bad smell. ? Warmth.  Do not take baths, swim, or use a hot tub until your health care provider approves.  You may shower 24-48 hours after the procedure, or as directed by your health care provider. ? Remove the dressing and gently wash the site with plain soap and water. ? Pat the area dry with a clean towel. ? Do not rub the site. That could cause bleeding.  Do not apply powder or lotion to the site. Activity  For 24 hours after the procedure, or as directed by your health care provider: ? Do not flex or bend the affected arm. ? Do not push or pull heavy objects with the affected arm. ? Do not drive  yourself home from the hospital or clinic. You may drive 24 hours after the procedure unless your health care provider tells you not to. ? Do not operate machinery or power tools.  Do not lift anything that is heavier than 10 lb (4.5 kg), or the limit that you are told, until your health care provider says that it is safe.  Ask your health care provider when it is okay to: ? Return to work or school. ? Resume usual physical activities or sports. ? Resume sexual activity.   General instructions  If the catheter site starts to bleed, raise your arm and put firm pressure on the site. If the bleeding does not stop, get help right away. This is a medical emergency.  If you went home on the same day as your procedure, a responsible adult should be with you for the first 24 hours after you arrive home.  Keep all follow-up visits as told by your health care provider. This is important. Contact a health care provider if:  You have a fever.  You have redness, swelling, or yellow drainage around your insertion site. Get help right away if:  You have unusual pain at the radial site.  The catheter insertion area swells very fast.  The insertion area is bleeding, and the bleeding does not stop when you hold steady pressure on the area.  Your arm or hand becomes pale, cool, tingly, or numb. These symptoms may represent a serious   problem that is an emergency. Do not wait to see if the symptoms will go away. Get medical help right away. Call your local emergency services (911 in the [place] Do not drive yourself to the hospital. Summary  After the procedure, it is common to have bruising and tenderness at the site.  Follow instructions from your health care provider about how to take care of your radial site wound. Check the wound every day for signs of infection.  Do not lift anything that is heavier than 10 lb (4.5 kg), or the limit that you are told, until your health care provider says that it  is safe. This information is not intended to replace advice given to you by your health care provider. Make sure you discuss any questions you have with your health care provider. Document Revised: 12/11/2017 Document Reviewed: 12/11/2017 Elsevier Patient Education  2021 [employer]  

## 2021-04-17 DIAGNOSIS — K5901 Slow transit constipation: Secondary | ICD-10-CM | POA: Diagnosis not present

## 2021-04-17 DIAGNOSIS — F419 Anxiety disorder, unspecified: Secondary | ICD-10-CM | POA: Diagnosis not present

## 2021-04-17 DIAGNOSIS — G629 Polyneuropathy, unspecified: Secondary | ICD-10-CM | POA: Diagnosis not present

## 2021-04-17 DIAGNOSIS — G43909 Migraine, unspecified, not intractable, without status migrainosus: Secondary | ICD-10-CM | POA: Diagnosis not present

## 2021-04-18 ENCOUNTER — Encounter (HOSPITAL_COMMUNITY): Payer: Self-pay | Admitting: Internal Medicine

## 2021-04-27 ENCOUNTER — Ambulatory Visit: Payer: PPO

## 2021-05-15 DIAGNOSIS — G43909 Migraine, unspecified, not intractable, without status migrainosus: Secondary | ICD-10-CM | POA: Diagnosis not present

## 2021-05-15 DIAGNOSIS — F419 Anxiety disorder, unspecified: Secondary | ICD-10-CM | POA: Diagnosis not present

## 2021-05-15 DIAGNOSIS — G629 Polyneuropathy, unspecified: Secondary | ICD-10-CM | POA: Diagnosis not present

## 2021-05-15 DIAGNOSIS — K5901 Slow transit constipation: Secondary | ICD-10-CM | POA: Diagnosis not present

## 2021-05-15 NOTE — Progress Notes (Unsigned)
Patient ID: Julie Hernandez                 DOB: [birth year]                    MRN: [id number]     HPI: Julie Hernandez is a 50 y.o. female patient referred to lipid clinic by Dr. Vaughan Browner. 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 She first had an MI in 2010 in [facility] and had a stent placed. She continued to have chest pain and had a second stent placed that same year. Pt had an NSTEMI March of 2021. Cath 03/2021 showed 1st Diag lesion is 60% stenosed and Prox RCA lesion is 50% stenosed with 50% stenosed side branch in RV Branch. Balloon angioplasty was performed  Patient presents today in good spirits  Can check a fasting lipid panel today  Vascepa  Switch ezetimibe to nexlizet  Compliance? Muscle pain? Meds tried in the past and any side effects? Go over labs and goals Lipid therapy options - Diet?? Exercise??   Insurance coverage? Need for PAP?  Order Lab F/u appt in 3 months (lipid panel and LFTs)  Current Medications: Repatha 140 mg every 2 weeks, ezetimibe 10 mg daily Intolerances: atorvastatin and rosuvastatin (flu-like symptoms) Risk Factors: CAD s/p multiple MIs beginning in her 43s, DM, CHF, family history of premature CAD LDL goal: '55mg'$ /dL; could target <40 mg/dL given multiple events within the last 2 years per ECS guidelines  Diet:   Exercise:   Family History: Mother died at 10 from MI and HF. Dad had stroke. Only child.   Social History: Former tobacco abuse, quit April 2021. Social alcohol use, denies illicit drug use  Labs: 06/21/20: LDL 54, TC 113, TG 153, HDL 33 (Repatha 140 mg every 2 weeks and ezetimibe 10 mg daily)  Past Medical History:  Diagnosis Date   CHF (congestive heart failure) (HCC)    Coronary artery disease    COVID 12/03/2020   Diabetes mellitus without complication (HCC)     Hypertension     Current Outpatient Medications on File Prior to Visit  Medication Sig Dispense Refill   albuterol (VENTOLIN HFA) 108 (90 Base) MCG/ACT inhaler Inhale 2 puffs into the lungs every 4 (four) hours as needed for wheezing or shortness of breath.     aspirin EC 81 MG tablet Take 81 mg by mouth daily.     blood glucose meter kit and supplies Dispense based on patient and insurance preference. Use up to four times daily as directed. (FOR ICD-10 E10.9, E11.9). 1 each 1   carvedilol (COREG) 6.25 MG tablet Take 1 tablet (6.25 mg total) by mouth 2 (two) times daily with a meal. 60 tablet 3   clopidogrel (PLAVIX) 75 MG tablet Take 75 mg by mouth daily.     empagliflozin (JARDIANCE) 10 MG TABS tablet Take 1 tablet (10 mg total) by mouth daily. 30 tablet 11   Evolocumab (REPATHA SURECLICK) 749 MG/ML SOAJ Inject 1 pen into the skin every 14 (fourteen) days. 2 pen 11   ezetimibe (ZETIA) 10 MG tablet Take 1 tablet (10 mg total) by mouth daily at 6pm. (Patient taking differently: Take 10 mg by mouth every evening.) 30 tablet 11   famotidine (PEPCID) 40 MG tablet Take 40 mg by mouth daily as needed for heartburn.  furosemide (LASIX) 20 MG tablet Take 2 tablets (40 mg total) by mouth every other day. 60 tablet 11   insulin glargine (LANTUS) 100 UNIT/ML Solostar Pen Inject 8 Units into the skin daily. Further refills by PCP 15 mL 0   lisinopril (ZESTRIL) 5 MG tablet Take 5 mg by mouth daily.     metFORMIN (GLUCOPHAGE) 500 MG tablet Take 2 tablets (1,000 mg total) by mouth 2 (two) times daily with a meal. 360 tablet 3   nitroGLYCERIN (NITROSTAT) 0.4 MG SL tablet Place 0.4 mg under the tongue every 5 (five) minutes as needed for chest pain.     ondansetron (ZOFRAN ODT) 4 MG disintegrating tablet Take 1 tablet (4 mg total) by mouth every 8 (eight) hours as needed for nausea or vomiting. 10 tablet 0   RABEprazole (ACIPHEX) 20 MG tablet Take 40 mg by mouth daily.     spironolactone (ALDACTONE) 25 MG  tablet Take 0.5 tablets (12.5 mg total) by mouth daily. 45 tablet 3   No current facility-administered medications on file prior to visit.    Allergies  Allergen Reactions   Esomeprazole Magnesium Anaphylaxis    unkn   Atorvastatin Other (See Comments)    Flu like symptoms   Ciprofibrate Itching   Ciprofloxacin Itching   Gabapentin     Achy   Lubiprostone Diarrhea   Statins     Flu like symptoms   Amlodipine Rash   Latex Rash and Swelling    Assessment/Plan:  1. Hyperlipidemia -   Lorel Monaco, [name], [place] [address] [place], [place] [address] Phone: [phone]; Fax: [phone]

## 2021-05-16 ENCOUNTER — Ambulatory Visit: Payer: PPO

## 2021-05-23 ENCOUNTER — Encounter: Payer: Self-pay | Admitting: *Deleted

## 2021-06-08 DIAGNOSIS — G629 Polyneuropathy, unspecified: Secondary | ICD-10-CM | POA: Diagnosis not present

## 2021-06-08 DIAGNOSIS — G43909 Migraine, unspecified, not intractable, without status migrainosus: Secondary | ICD-10-CM | POA: Diagnosis not present

## 2021-06-08 DIAGNOSIS — K5901 Slow transit constipation: Secondary | ICD-10-CM | POA: Diagnosis not present

## 2021-06-08 DIAGNOSIS — F419 Anxiety disorder, unspecified: Secondary | ICD-10-CM | POA: Diagnosis not present

## 2021-07-17 ENCOUNTER — Other Ambulatory Visit: Payer: Self-pay | Admitting: Internal Medicine

## 2021-07-18 DIAGNOSIS — K5901 Slow transit constipation: Secondary | ICD-10-CM | POA: Diagnosis not present

## 2021-07-18 DIAGNOSIS — F419 Anxiety disorder, unspecified: Secondary | ICD-10-CM | POA: Diagnosis not present

## 2021-07-18 DIAGNOSIS — G629 Polyneuropathy, unspecified: Secondary | ICD-10-CM | POA: Diagnosis not present

## 2021-07-18 DIAGNOSIS — G43909 Migraine, unspecified, not intractable, without status migrainosus: Secondary | ICD-10-CM | POA: Diagnosis not present

## 2021-08-06 NOTE — Progress Notes (Signed)
Cardiology Clinic Note   ---  PATIENT DID NOT SHOW FOR VISIT. NOTE LEFT FOR TEMPLATING PURPOSES ONLY ---    Date:  01/02/2021   ID:  Julie Hernandez, DOB Nov 11, 1971, MRN [id number]  Location: Home  Provider location: [facility] Type of Visit: Established patient  PCP:  Julie Mainland, FNP  Cardiologist:  Julie Bickers, MD Primary HF: Julie Hernandez  Chief Complaint: Heart Failure follow-up   History of Present Illness:  Julie Hernandez is a 50 y.o. woman with COPD/ongoing tobacco abuse, premature CAD s/p anterior MI, diet-controlled DM2, chronic back pain, and systolic HF due to [facility] referred for further evaluation of CAD and systolic HF.  Had anterior MI in 2010 taken emergently to [facility]. Stent was placed emergently. Post stenting she continued to have CP. Went to [facility] that same year and had  a second stent placed. Did well from a cardiac perspective until 3/21.   Cath 02/08/20 at [facility] for NSTEMI. 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 Was told she may need repeat stenting vs CABG. Referred here for second opinion.   We saw her in 5/21 with daily angina. Underwent MRI which showed EF 26% only minimal viability in anterior wall. Decision to proceed with PCI of LCX and LAD (ISR) over CABG. Had successful PCI of LCX and LAD with Dr. Burt Knack on 5/20.   Echo 9/21 EF 60-65%  Echo 04/06/21 EF 50-55% mild anterior and apical HK  Here for f/u. Says she is starting to have some CP. Will come and go fairly quickly. Has happened everyday for past few days. NTG helps. Not related to exertion. No clear trigger. Still vaping. No edema, orthopnea or PND.    Studies:  Zio 7/31 1. Sinus rhythm - avg HR of 82 2. 13 Ventricular Tachycardia runs occurred, the run with the fastest interval lasting 12 beats with a max  rate of 139 bpm (avg 110 bpm); the run with the fastest interval was also the longest 3. Very frequents PVCs (32.2%, [id number]), VE Couplets were rare (<1.0%, 2118),  4. Ventricular Bigeminy and Trigeminy were present. 5.. Multiple patient-triggered events associated  cMRI 03/29/20 1. Subendocardial late gadolinium enhancement consistent with prior infarct in the LAD territory. There is >50% transmural LGE suggesting nonviability in the basal to apical anterior wall and apex. There is <50% transmural LGE suggesting viability in the basal to mid anteroseptum and apical septum  2.  LV apical thrombus measuring 54m x 723m  3. Normal LV size with severe systolic dysfunction (EF 2629% Akinesis of basal to apical anterior/anteroseptal walls and apex   4.  Small RV size with normal systolic function (EF 6252%  Past Medical History:  Diagnosis Date   CHF (congestive heart failure) (HCC)    Coronary artery disease    COVID 12/03/2020   Diabetes mellitus without complication (HCC)    Hypertension     Current Outpatient Medications  Medication Sig Dispense Refill   albuterol (VENTOLIN HFA) 108 (90 Base) MCG/ACT inhaler Inhale 2 puffs into the lungs every 4 (four) hours as needed for wheezing or shortness of breath.     aspirin EC 81 MG tablet Take 81 mg by mouth daily.     blood glucose meter kit and supplies Dispense based on patient and insurance preference. Use  up to four times daily as directed. (FOR ICD-10 E10.9, E11.9). 1 each 1   carvedilol (COREG) 6.25 MG tablet Take 1 tablet (6.25 mg total) by mouth 2 (two) times daily with a meal. 60 tablet 3   clopidogrel (PLAVIX) 75 MG tablet Take 75 mg by mouth daily.     empagliflozin (JARDIANCE) 10 MG TABS tablet Take 1 tablet (10 mg total) by mouth daily. 30 tablet 11   Evolocumab (REPATHA SURECLICK) 756 MG/ML SOAJ Inject 1 pen into the skin every 14 (fourteen) days. 2 pen 11   ezetimibe (ZETIA) 10 MG tablet Take 1 tablet (10 mg total) by mouth  daily at 6pm. (Patient taking differently: Take 10 mg by mouth every evening.) 30 tablet 11   famotidine (PEPCID) 40 MG tablet Take 40 mg by mouth daily as needed for heartburn.     furosemide (LASIX) 20 MG tablet Take 2 tablets (40 mg total) by mouth every other day. 60 tablet 11   insulin glargine (LANTUS) 100 UNIT/ML Solostar Pen Inject 8 Units into the skin daily. Further refills by PCP 15 mL 0   lisinopril (ZESTRIL) 5 MG tablet Take 5 mg by mouth daily.     metFORMIN (GLUCOPHAGE) 500 MG tablet Take 2 tablets (1,000 mg total) by mouth 2 (two) times daily with a meal. 360 tablet 3   nitroGLYCERIN (NITROSTAT) 0.4 MG SL tablet Place 0.4 mg under the tongue every 5 (five) minutes as needed for chest pain.     ondansetron (ZOFRAN ODT) 4 MG disintegrating tablet Take 1 tablet (4 mg total) by mouth every 8 (eight) hours as needed for nausea or vomiting. 10 tablet 0   RABEprazole (ACIPHEX) 20 MG tablet Take 40 mg by mouth daily.     spironolactone (ALDACTONE) 25 MG tablet Take 0.5 tablets (12.5 mg total) by mouth daily. 45 tablet 3   No current facility-administered medications for this encounter.    Allergies  Allergen Reactions   Esomeprazole Magnesium Anaphylaxis    unkn   Atorvastatin Other (See Comments)    Flu like symptoms   Ciprofibrate Itching   Ciprofloxacin Itching   Gabapentin     Achy   Lubiprostone Diarrhea   Statins     Flu like symptoms   Amlodipine Rash   Latex Rash and Swelling      Social History   Socioeconomic History   Marital status: Divorced    Spouse name: Not on file   Number of children: 2   Years of education: Not on file   Highest education level: Not on file  Occupational History   Occupation: On disability  Tobacco Use   Smoking status: Former    Types: Cigarettes    Quit date: 02/18/2020    Years since quitting: 1.4   Smokeless tobacco: Never  Vaping Use   Vaping Use: Some days  Substance and Sexual Activity   Alcohol use: Yes    Comment:  SOCIAL   Drug use: Never   Sexual activity: Not on file  Other Topics Concern   Not on file  Social History Narrative   Not on file   Social Determinants of Health   Financial Resource Strain: Not on file  Food Insecurity: Not on file  Transportation Needs: Not on file  Physical Activity: Not on file  Stress: Not on file  Social Connections: Not on file  Intimate Partner Violence: Not on file    FHx:  M died at 9 from MI and HF D  had stroke No brother and sisters  There were no vitals filed for this visit.    Exam:   General:  Well appearing. No resp difficulty HEENT: normal Neck: supple. no JVD. Carotids 2+ bilat; no bruits. No lymphadenopathy or thryomegaly appreciated. Cor: PMI nondisplaced. Regular rate & rhythm. No rubs, gallops or murmurs. Lungs: clear Abdomen: soft, nontender, nondistended. No hepatosplenomegaly. No bruits or masses. Good bowel sounds. Extremities: no cyanosis, clubbing, rash, edema Neuro: alert & orientedx3, cranial nerves grossly intact. moves all 4 extremities w/o difficulty. Affect pleasant   Recent Labs: 04/08/2020: Hemoglobin 13.0; Platelets 242 06/21/2020: ALT 13 07/26/2020: B Natriuretic Peptide 62.7 09/28/2020: BUN 13; Creatinine, Ser 0.91; Magnesium 2.2; Potassium 4.8; Sodium 141  Personally reviewed   Wt Readings from Last 3 Encounters:  09/28/20 79.7 kg (175 lb 12.8 oz)  07/26/20 78.6 kg (173 lb 3.2 oz)  05/12/20 76.7 kg (169 lb)    ECG: NSR 85 LVH septal Qs. No acute ST-T changes Personally reviewed   ASSESSMENT & PLAN:  1. CAD with recurrent chest pressure - s/p PCI of LCX and LAD (ISR) in 5/21. 50% mRCA lesion  - Has has CP over past few days. Fairly typical in nature but not exertional. We discussed possibility of repeat coronary angiography. She will follow symptoms for next few days. If not improving or getting worse will plan repeat cath in near future. Call 911 for severe symptoms.  - Continue ASA/Plavix - She has  been intolerant of all statin. Recent LDL 54 on Repatha/Zetia. Follows with Lipid Clinic  2. 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 dbpr - Stable NYHA II. Volume status ok on lasix 40 qod.  - Continue carvedilol to 6.25 bid - Continue Jardiance - Continue spiro 12.5 - Continue lisinopril 5. Can consider switch to [facility] as BP tolerates   3. LV apical clot - resolved   4. Hyperlipidemia - Intolerant of all statins. On repatha/zetia  - last LDL 54 8/21 - Follows with Lipid Clinic.  5. DM2 - HgBa1c previously 11.0  - On Jardiance.  - Following with Dr. Monna Fam.  6. Tobacco use - off cigarettes but now vaping. - encouraged cessation   7. Frequent PVCs - Zio 7/31 with 32% PVCs. - Resolved   Julie Bickers, MD  3:36 PM

## 2021-08-07 ENCOUNTER — Inpatient Hospital Stay (HOSPITAL_COMMUNITY)
Admission: RE | Admit: 2021-08-07 | Discharge: 2021-08-07 | Disposition: A | Discharge status: Home / Self Care | Payer: PPO | Source: Ambulatory Visit | Attending: Internal Medicine | Admitting: Internal Medicine

## 2021-08-07 DIAGNOSIS — I2510 Atherosclerotic heart disease of native coronary artery without angina pectoris: Secondary | ICD-10-CM

## 2021-08-11 ENCOUNTER — Other Ambulatory Visit (HOSPITAL_COMMUNITY): Payer: Self-pay | Admitting: Internal Medicine

## 2021-09-11 DIAGNOSIS — G4700 Insomnia, unspecified: Secondary | ICD-10-CM | POA: Insufficient documentation

## 2021-09-11 DIAGNOSIS — E559 Vitamin D deficiency, unspecified: Secondary | ICD-10-CM | POA: Insufficient documentation

## 2021-09-11 DIAGNOSIS — G629 Polyneuropathy, unspecified: Secondary | ICD-10-CM | POA: Insufficient documentation

## 2021-09-11 DIAGNOSIS — D649 Anemia, unspecified: Secondary | ICD-10-CM | POA: Insufficient documentation

## 2021-09-11 DIAGNOSIS — E039 Hypothyroidism, unspecified: Secondary | ICD-10-CM | POA: Insufficient documentation

## 2021-09-11 DIAGNOSIS — I10 Essential (primary) hypertension: Secondary | ICD-10-CM | POA: Insufficient documentation

## 2021-09-11 DIAGNOSIS — K219 Gastro-esophageal reflux disease without esophagitis: Secondary | ICD-10-CM | POA: Insufficient documentation

## 2021-09-11 DIAGNOSIS — K5909 Other constipation: Secondary | ICD-10-CM | POA: Insufficient documentation

## 2021-10-19 ENCOUNTER — Ambulatory Visit (HOSPITAL_COMMUNITY)
Admission: RE | Admit: 2021-10-19 | Discharge: 2021-10-19 | Disposition: A | Discharge status: Home / Self Care | Payer: PPO | Source: Ambulatory Visit | Attending: Internal Medicine | Admitting: Internal Medicine

## 2021-10-19 ENCOUNTER — Other Ambulatory Visit: Payer: Self-pay

## 2021-10-19 VITALS — BP 120/70 | HR 60 | Wt 184.0 lb

## 2021-10-19 DIAGNOSIS — Z79899 Other long term (current) drug therapy: Secondary | ICD-10-CM | POA: Insufficient documentation

## 2021-10-19 DIAGNOSIS — R0789 Other chest pain: Secondary | ICD-10-CM | POA: Insufficient documentation

## 2021-10-19 DIAGNOSIS — M549 Dorsalgia, unspecified: Secondary | ICD-10-CM | POA: Insufficient documentation

## 2021-10-19 DIAGNOSIS — Z7901 Long term (current) use of anticoagulants: Secondary | ICD-10-CM | POA: Diagnosis not present

## 2021-10-19 DIAGNOSIS — E1165 Type 2 diabetes mellitus with hyperglycemia: Secondary | ICD-10-CM | POA: Diagnosis not present

## 2021-10-19 DIAGNOSIS — Z955 Presence of coronary angioplasty implant and graft: Secondary | ICD-10-CM | POA: Diagnosis not present

## 2021-10-19 DIAGNOSIS — E1159 Type 2 diabetes mellitus with other circulatory complications: Secondary | ICD-10-CM | POA: Insufficient documentation

## 2021-10-19 DIAGNOSIS — I5022 Chronic systolic (congestive) heart failure: Secondary | ICD-10-CM | POA: Diagnosis not present

## 2021-10-19 DIAGNOSIS — F419 Anxiety disorder, unspecified: Secondary | ICD-10-CM | POA: Insufficient documentation

## 2021-10-19 DIAGNOSIS — I2510 Atherosclerotic heart disease of native coronary artery without angina pectoris: Secondary | ICD-10-CM | POA: Diagnosis not present

## 2021-10-19 DIAGNOSIS — I493 Ventricular premature depolarization: Secondary | ICD-10-CM | POA: Insufficient documentation

## 2021-10-19 DIAGNOSIS — Z7984 Long term (current) use of oral hypoglycemic drugs: Secondary | ICD-10-CM | POA: Insufficient documentation

## 2021-10-19 DIAGNOSIS — Z7982 Long term (current) use of aspirin: Secondary | ICD-10-CM | POA: Diagnosis not present

## 2021-10-19 DIAGNOSIS — G8929 Other chronic pain: Secondary | ICD-10-CM | POA: Diagnosis not present

## 2021-10-19 DIAGNOSIS — J449 Chronic obstructive pulmonary disease, unspecified: Secondary | ICD-10-CM | POA: Insufficient documentation

## 2021-10-19 DIAGNOSIS — E782 Mixed hyperlipidemia: Secondary | ICD-10-CM | POA: Diagnosis not present

## 2021-10-19 DIAGNOSIS — Z7902 Long term (current) use of antithrombotics/antiplatelets: Secondary | ICD-10-CM | POA: Insufficient documentation

## 2021-10-19 DIAGNOSIS — I252 Old myocardial infarction: Secondary | ICD-10-CM | POA: Diagnosis not present

## 2021-10-19 DIAGNOSIS — Z09 Encounter for follow-up examination after completed treatment for conditions other than malignant neoplasm: Secondary | ICD-10-CM | POA: Diagnosis not present

## 2021-10-19 DIAGNOSIS — F17293 Nicotine dependence, other tobacco product, with withdrawal: Secondary | ICD-10-CM | POA: Diagnosis not present

## 2021-10-19 DIAGNOSIS — I110 Hypertensive heart disease with heart failure: Secondary | ICD-10-CM | POA: Insufficient documentation

## 2021-10-19 DIAGNOSIS — Z8616 Personal history of COVID-19: Secondary | ICD-10-CM | POA: Diagnosis not present

## 2021-10-19 LAB — LIPID PANEL
Cholesterol: 233 mg/dL — ABNORMAL HIGH (ref 0–200)
HDL: 35 mg/dL — ABNORMAL LOW (ref >40)
LDL Cholesterol: 146 mg/dL — ABNORMAL HIGH (ref 0–99)
Total CHOL/HDL Ratio: 6.7 RATIO
Triglycerides: 258 mg/dL — ABNORMAL HIGH (ref <150)
VLDL: 52 mg/dL — ABNORMAL HIGH (ref 0–40)

## 2021-10-19 LAB — COMPREHENSIVE METABOLIC PANEL
ALT: 17 U/L (ref 0–44)
AST: 14 U/L — ABNORMAL LOW (ref 15–41)
Albumin: 3.7 g/dL (ref 3.5–5.0)
Alkaline Phosphatase: 89 U/L (ref 38–126)
Anion gap: 10 (ref 5–15)
BUN: 13 mg/dL (ref 6–20)
CO2: 28 mmol/L (ref 22–32)
Calcium: 11.5 mg/dL — ABNORMAL HIGH (ref 8.9–10.3)
Chloride: 98 mmol/L (ref 98–111)
Creatinine, Ser: 0.96 mg/dL (ref 0.44–1.00)
GFR, Estimated: >60 mL/min (ref >60)
Glucose, Bld: 340 mg/dL — ABNORMAL HIGH (ref 70–99)
Potassium: 4.9 mmol/L (ref 3.5–5.1)
Sodium: 136 mmol/L (ref 135–145)
Total Bilirubin: 0.2 mg/dL — ABNORMAL LOW (ref 0.3–1.2)
Total Protein: 6.6 g/dL (ref 6.5–8.1)

## 2021-10-19 LAB — HEMOGLOBIN A1C
Hgb A1c MFr Bld: 13.3 % — ABNORMAL HIGH (ref 4.8–5.6)
Mean Plasma Glucose: 335.01 mg/dL

## 2021-10-19 NOTE — Patient Instructions (Signed)
Medication Changes:  No change  Lab Work:  Labs done today, your results will be available in MyChart, we will contact you for abnormal readings.   Testing/Procedures:  none  Referrals:  none  Follow-Up in: 9 months (September 2023) ** Call in July for an appointment  At the [facility], you and your health needs are our priority. We have a designated team specialized in the treatment of Heart Failure. This Care Team includes your primary Heart Failure Specialized Cardiologist (physician), Advanced Practice Providers (APPs- Physician Assistants and Nurse Practitioners), and Pharmacist who all work together to provide you with the care you need, when you need it.   You may see any of the following providers on your designated Care Team at your next follow up: Dr Arvilla Meres Dr Carron Curie, NP Robbie Lis, [name] [facility] [place], [name] Karle Plumber, [name]   Please be sure to bring in all your medications bottles to every appointment.   Need to Contact us:  If you have any questions or concerns before your next appointment please send [place] a message through [place] or call our office at [phone].    TO LEAVE A MESSAGE FOR THE NURSE SELECT OPTION 2, PLEASE LEAVE A MESSAGE INCLUDING: YOUR NAME DATE OF BIRTH CALL BACK NUMBER REASON FOR CALL**this is important as we prioritize the call backs  YOU WILL RECEIVE A CALL BACK THE SAME DAY AS LONG AS YOU CALL BEFORE 4:00 PM

## 2021-10-19 NOTE — Progress Notes (Signed)
Cardiology Clinic Not  Date:  01/02/2021   ID:  Christina, Gintz 15-Oct-1971, MRN [id number]  Location: Home  Provider location: [facility] Type of Visit: Established patient  PCP:  Truett Mainland, FNP  Cardiologist:  Glori Bickers, MD Primary HF: Bensimhon  Chief Complaint: Heart Failure follow-up   History of Present Illness:  Angenette is a 50 y.o. woman with COPD/ongoing tobacco abuse, premature CAD s/p anterior MI, diet-controlled DM2, chronic back pain, and systolic HF due to [facility] referred for further evaluation of CAD and systolic HF.  Had anterior MI in 2010 taken emergently to [facility]. Stent was placed emergently. Post stenting she continued to have CP. Went to [facility] that same year and had  a second stent placed. Did well from a cardiac perspective until 3/21.   Cath 02/08/20 at [facility] for NSTEMI. 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 Was told she may need repeat stenting vs CABG. Referred here for second opinion.   We saw her in 5/21 with daily angina. Underwent MRI which showed EF 26% only minimal viability in anterior wall. Decision to proceed with PCI of LCX and LAD (ISR) over CABG. Had successful PCI of LCX and LAD with Dr. Burt Knack on 5/20.   Echo 9/21 EF 60-65%  Echo 04/06/21 EF 50-55% mild anterior and apical HK  Here for f/u. Recently started on lexapro and feeling much better. Less anxiety. Remains fairly active. No significant CP. Breathing ok. No problems with activities. Still vaping. No cigarettes. SBP will drop into 90s at times and she will feel bad.    Studies:  Zio 7/31 1. Sinus rhythm - avg HR of 82 2. 13 Ventricular Tachycardia runs occurred, the run with the fastest interval lasting 12 beats with a max rate of 139 bpm (avg 110 bpm); the run with the fastest  interval was also the longest 3. Very frequents PVCs (32.2%, [id number]), VE Couplets were rare (<1.0%, 2118),  4. Ventricular Bigeminy and Trigeminy were present. 5.. Multiple patient-triggered events associated  cMRI 03/29/20 1. Subendocardial late gadolinium enhancement consistent with prior infarct in the LAD territory. There is >50% transmural LGE suggesting nonviability in the basal to apical anterior wall and apex. There is <50% transmural LGE suggesting viability in the basal to mid anteroseptum and apical septum  2.  LV apical thrombus measuring 62m x 767m  3. Normal LV size with severe systolic dysfunction (EF 2627% Akinesis of basal to apical anterior/anteroseptal walls and apex   4.  Small RV size with normal systolic function (EF 6207%  Past Medical History:  Diagnosis Date   CHF (congestive heart failure) (HCC)    Coronary artery disease    COVID 12/03/2020   Diabetes mellitus without complication (HCC)    Hypertension     Current Outpatient Medications  Medication Sig Dispense Refill   albuterol (VENTOLIN HFA) 108 (90 Base) MCG/ACT inhaler Inhale 2 puffs into the lungs every 4 (four) hours as needed for wheezing or shortness of breath.     aspirin EC 81 MG tablet Take 81 mg by mouth daily.     blood glucose meter kit and supplies Dispense based on patient and insurance preference. Use up to four times daily as directed. (FOR ICD-10 E10.9, E11.9). 1 each 1   carvedilol (COREG)  6.25 MG tablet Take 1 tablet (6.25 mg total) by mouth 2 (two) times daily with a meal. 60 tablet 3   clopidogrel (PLAVIX) 75 MG tablet Take 75 mg by mouth daily.     empagliflozin (JARDIANCE) 10 MG TABS tablet Take 1 tablet (10 mg total) by mouth daily. 30 tablet 11   escitalopram (LEXAPRO) 5 MG tablet Take 5 mg by mouth daily.     Evolocumab (REPATHA SURECLICK) 329 MG/ML SOAJ Inject 1 pen into the skin every 14 (fourteen) days. 2 pen 11   ezetimibe (ZETIA) 10 MG tablet Take 1 tablet (10 mg  total) by mouth daily at 6pm. 30 tablet 11   famotidine (PEPCID) 40 MG tablet Take 40 mg by mouth daily as needed for heartburn.     furosemide (LASIX) 20 MG tablet Take 2 tablets (40 mg total) by mouth every other day. 60 tablet 11   insulin glargine (LANTUS) 100 UNIT/ML Solostar Pen Inject 8 Units into the skin daily. Further refills by PCP 15 mL 0   lisinopril (ZESTRIL) 5 MG tablet Take 5 mg by mouth daily.     metFORMIN (GLUCOPHAGE) 500 MG tablet Take 2 tablets (1,000 mg total) by mouth 2 (two) times daily with a meal. 360 tablet 3   nitroGLYCERIN (NITROSTAT) 0.4 MG SL tablet PLACE 1 TABLET UNDER THE TONGUE AND ALLOW TO DISSOLVE SLOWLY, DO NOT CHEW OR SWALLOW. YOU MAY USE ADDITIONAL TABLETS EVERY 5 MINUTES BUT NO MORE THAN 3 TABLETS PER 15 MINUTES 25 tablet 1   ondansetron (ZOFRAN ODT) 4 MG disintegrating tablet Take 1 tablet (4 mg total) by mouth every 8 (eight) hours as needed for nausea or vomiting. 10 tablet 0   RABEprazole (ACIPHEX) 20 MG tablet Take 40 mg by mouth daily.     spironolactone (ALDACTONE) 25 MG tablet Take 0.5 tablets (12.5 mg total) by mouth daily. 45 tablet 3   No current facility-administered medications for this encounter.    Allergies  Allergen Reactions   Esomeprazole Magnesium Anaphylaxis    unkn   Atorvastatin Other (See Comments)    Flu like symptoms   Ciprofibrate Itching   Ciprofloxacin Itching   Gabapentin     Achy   Lubiprostone Diarrhea   Statins     Flu like symptoms   Amlodipine Rash   Latex Rash and Swelling      Social History   Socioeconomic History   Marital status: Divorced    Spouse name: Not on file   Number of children: 2   Years of education: Not on file   Highest education level: Not on file  Occupational History   Occupation: On disability  Tobacco Use   Smoking status: Former    Types: Cigarettes    Quit date: 02/18/2020    Years since quitting: 1.6   Smokeless tobacco: Never  Vaping Use   Vaping Use: Some days   Substance and Sexual Activity   Alcohol use: Yes    Comment: SOCIAL   Drug use: Never   Sexual activity: Not on file  Other Topics Concern   Not on file  Social History Narrative   Not on file   Social Determinants of Health   Financial Resource Strain: Not on file  Food Insecurity: Not on file  Transportation Needs: Not on file  Physical Activity: Not on file  Stress: Not on file  Social Connections: Not on file  Intimate Partner Violence: Not on file    FHx:  M died at  25 from MI and HF D had stroke No brother and sisters  Vitals:   10/19/21 1003  BP: 120/70  Pulse: 60  SpO2: 98%  Weight: 83.5 kg (184 lb)    Exam:   General:  Well appearing. No resp difficulty HEENT: normal Neck: supple. no JVD. Carotids 2+ bilat; no bruits. No lymphadenopathy or thryomegaly appreciated. Cor: PMI nondisplaced. Regular rate & rhythm. No rubs, gallops or murmurs. Lungs: clear Abdomen: obese soft, nontender, nondistended. No hepatosplenomegaly. No bruits or masses. Good bowel sounds. Extremities: no cyanosis, clubbing, rash, edema Neuro: alert & orientedx3, cranial nerves grossly intact. moves all 4 extremities w/o difficulty. Affect pleasant   Recent Labs: 04/08/2020: Hemoglobin 13.0; Platelets 242 06/21/2020: ALT 13 07/26/2020: B Natriuretic Peptide 62.7 09/28/2020: BUN 13; Creatinine, Ser 0.91; Magnesium 2.2; Potassium 4.8; Sodium 141  Personally reviewed   Wt Readings from Last 3 Encounters:  09/28/20 79.7 kg (175 lb 12.8 oz)  07/26/20 78.6 kg (173 lb 3.2 oz)  05/12/20 76.7 kg (169 lb)    ECG: NSR 85 LVH septal Qs. No acute ST-T changes Personally reviewed   ASSESSMENT & PLAN:  1. CAD with recurrent chest pressure - s/p PCI of LCX and LAD (ISR) in 5/21. 50% mRCA lesion  - Has has CP over past few days. Fairly typical in nature but not exertional. We discussed possibility of repeat coronary angiography. She will follow symptoms for next few days. If not improving or  getting worse will plan repeat cath in near future. Call 911 for severe symptoms.  - Continue ASA/Plavix - She has been intolerant of all statin. Recent LDL 54 on Repatha/Zetia. Follows with [place] lipids today  2. 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 dbpr - Stable/improved NYHA II. Volume status stable on lasix 40 qod + extra as needed - Continue carvedilol 6.25 bid - Continue Jardiance - Continue spiro 12.5 - Continue lisinopril 5. I wanted to titrate or switch to [facility] today but having periods of low BP.   3. LV apical clot - resolved  4. Hyperlipidemia - Intolerant of all statins. On repatha/zetia  - last LDL 54 8/21 - Follows with Lipid Clinic. Encouraged her to f/u as she appears overdue for her repatha shot  - Recheck lipids as above.   5. DM2 - HgBa1c previously 11.0  - On Jardiance.  - Following with Dr. Monna Fam. - We will draw A1c  6. Tobacco use - off cigarettes but now vaping. - encouraged cessation   7. Frequent PVCs - Zio 7/31 with 32% PVCs. - Resolved   Glori Bickers, MD  10:23 AM

## 2022-01-03 IMAGING — DX DG CHEST 2V
2 series · 2 of 2 positions shown · non-contrast
Comparison: 02/06/2020

CLINICAL DATA: Emesis, fever, chills, muscle cramps for 1 week,
chest pain

EXAM:
CHEST - 2 VIEW

[chest pa]
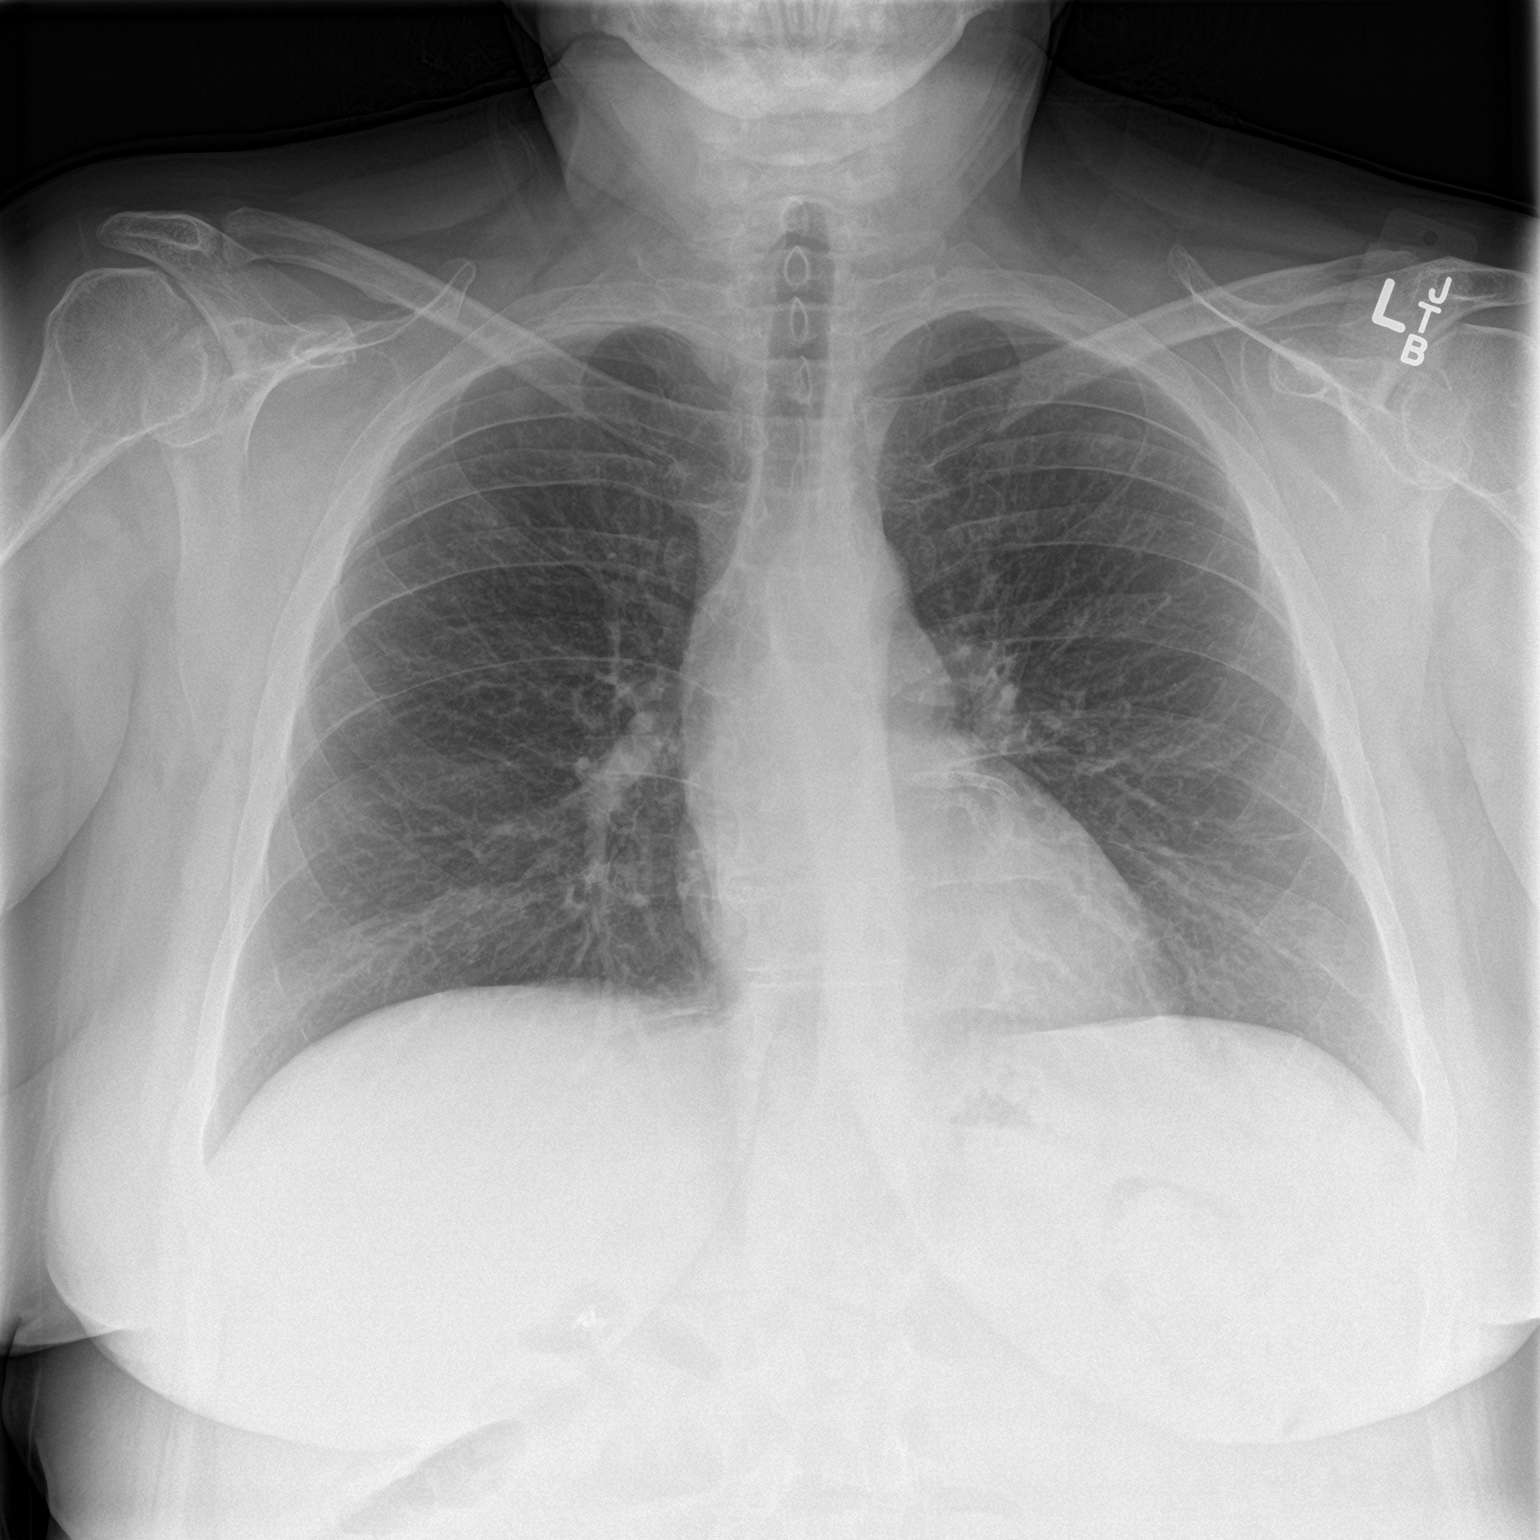

[chest lat]
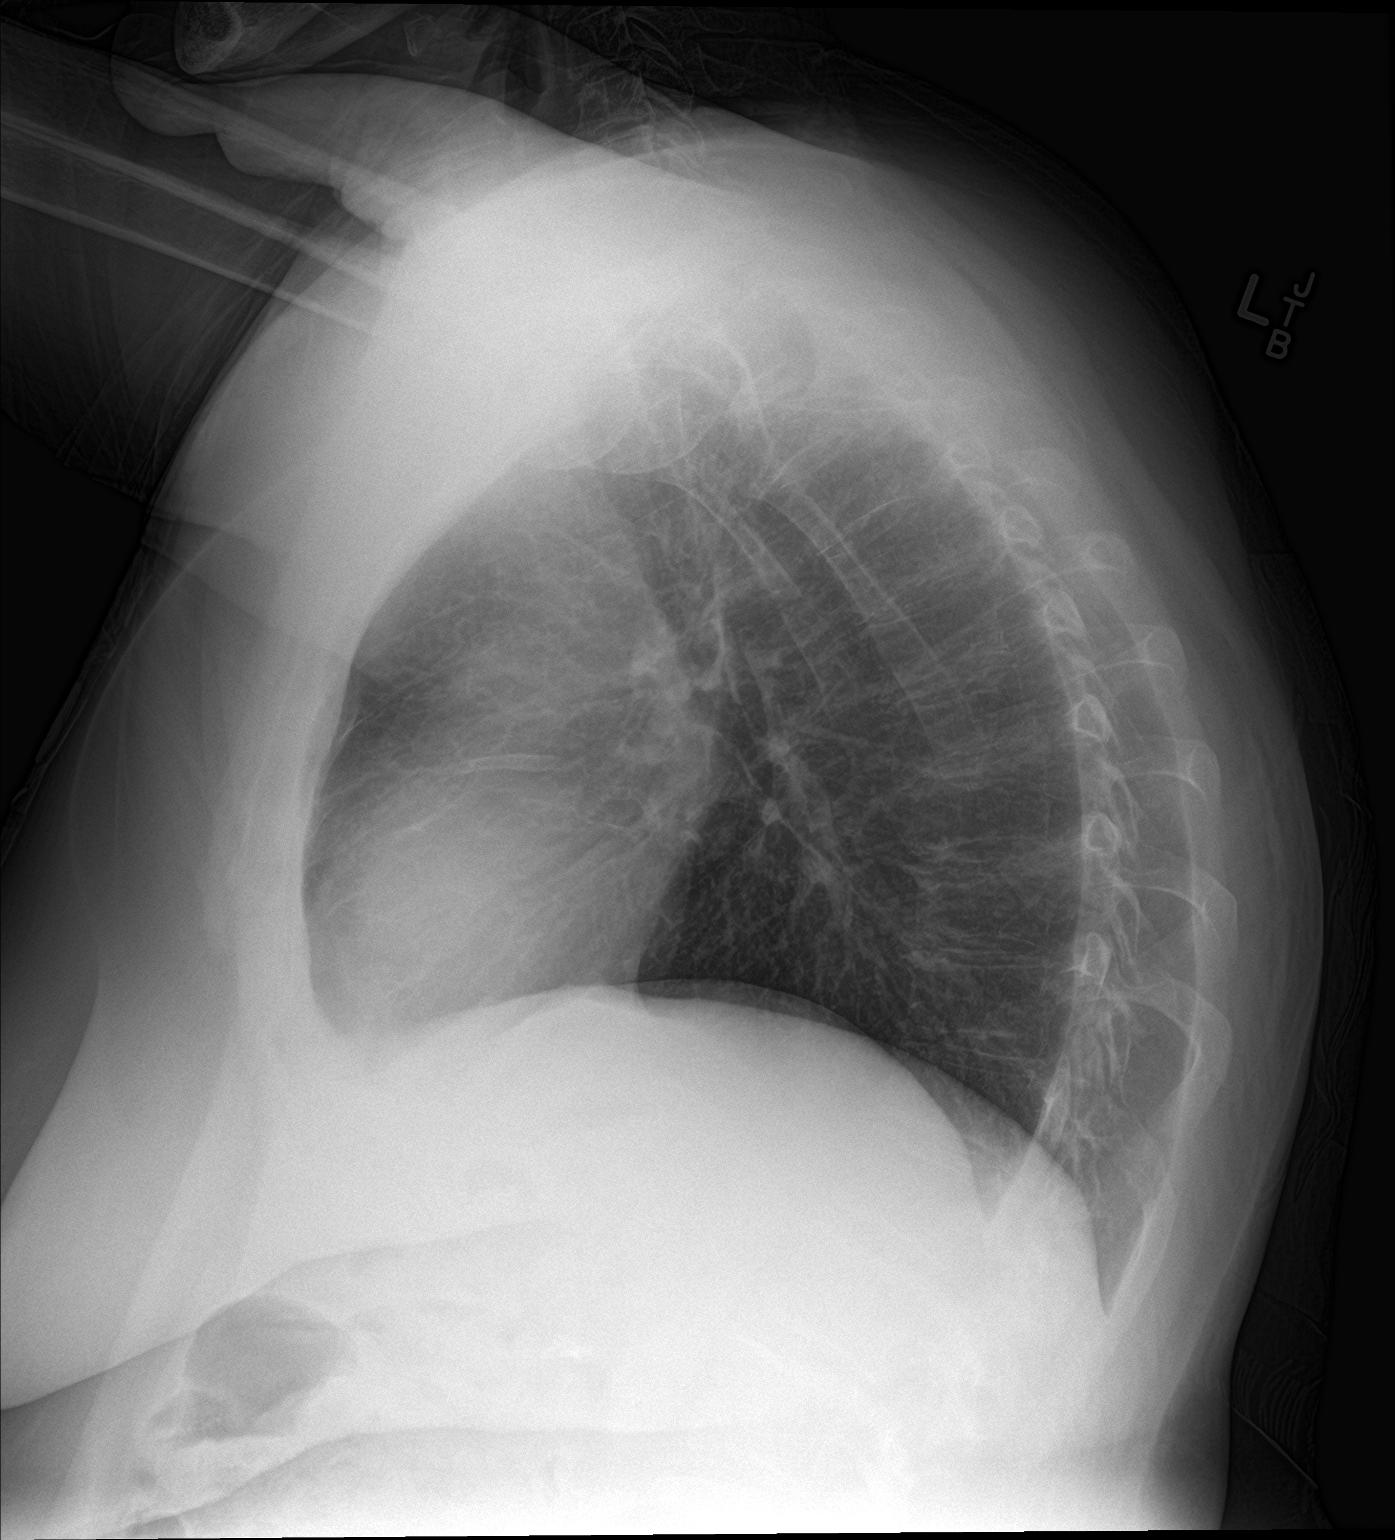

[2 of 2 positions shown; findings below may reference images not displayed]

FINDINGS: The heart size and mediastinal contours are within normal limits.
Both lungs are clear. The visualized skeletal structures are
unremarkable.
IMPRESSION: No active cardiopulmonary disease.

## 2022-03-30 ENCOUNTER — Other Ambulatory Visit: Payer: Self-pay | Admitting: Internal Medicine

## 2022-04-30 DIAGNOSIS — Z87891 Personal history of nicotine dependence: Secondary | ICD-10-CM | POA: Insufficient documentation

## 2022-04-30 DIAGNOSIS — Z8249 Family history of ischemic heart disease and other diseases of the circulatory system: Secondary | ICD-10-CM | POA: Insufficient documentation

## 2022-04-30 DIAGNOSIS — B379 Candidiasis, unspecified: Secondary | ICD-10-CM | POA: Insufficient documentation

## 2022-05-14 ENCOUNTER — Other Ambulatory Visit (HOSPITAL_COMMUNITY): Payer: Self-pay | Admitting: Internal Medicine

## 2022-05-31 ENCOUNTER — Other Ambulatory Visit (HOSPITAL_COMMUNITY): Payer: Self-pay | Admitting: Internal Medicine

## 2022-09-16 ENCOUNTER — Inpatient Hospital Stay (HOSPITAL_COMMUNITY)
Admission: EM | Admit: 2022-09-16 | Discharge: 2022-09-20 | DRG: 321 | Disposition: A | Discharge status: Home / Self Care | Payer: PPO | Attending: Internal Medicine | Admitting: Internal Medicine

## 2022-09-16 ENCOUNTER — Emergency Department (HOSPITAL_COMMUNITY): Payer: PPO

## 2022-09-16 ENCOUNTER — Other Ambulatory Visit: Payer: Self-pay

## 2022-09-16 DIAGNOSIS — I214 Non-ST elevation (NSTEMI) myocardial infarction: Principal | ICD-10-CM | POA: Diagnosis present

## 2022-09-16 DIAGNOSIS — Z951 Presence of aortocoronary bypass graft: Secondary | ICD-10-CM

## 2022-09-16 DIAGNOSIS — T82855A Stenosis of coronary artery stent, initial encounter: Secondary | ICD-10-CM | POA: Diagnosis present

## 2022-09-16 DIAGNOSIS — Z794 Long term (current) use of insulin: Secondary | ICD-10-CM

## 2022-09-16 DIAGNOSIS — G8929 Other chronic pain: Secondary | ICD-10-CM | POA: Diagnosis present

## 2022-09-16 DIAGNOSIS — Z7982 Long term (current) use of aspirin: Secondary | ICD-10-CM

## 2022-09-16 DIAGNOSIS — Z6835 Body mass index (BMI) 35.0-35.9, adult: Secondary | ICD-10-CM

## 2022-09-16 DIAGNOSIS — Z888 Allergy status to other drugs, medicaments and biological substances status: Secondary | ICD-10-CM

## 2022-09-16 DIAGNOSIS — I110 Hypertensive heart disease with heart failure: Secondary | ICD-10-CM | POA: Diagnosis present

## 2022-09-16 DIAGNOSIS — Z91048 Other nonmedicinal substance allergy status: Secondary | ICD-10-CM

## 2022-09-16 DIAGNOSIS — Z833 Family history of diabetes mellitus: Secondary | ICD-10-CM

## 2022-09-16 DIAGNOSIS — E1165 Type 2 diabetes mellitus with hyperglycemia: Secondary | ICD-10-CM | POA: Diagnosis present

## 2022-09-16 DIAGNOSIS — Z8249 Family history of ischemic heart disease and other diseases of the circulatory system: Secondary | ICD-10-CM

## 2022-09-16 DIAGNOSIS — E785 Hyperlipidemia, unspecified: Secondary | ICD-10-CM | POA: Diagnosis present

## 2022-09-16 DIAGNOSIS — E669 Obesity, unspecified: Secondary | ICD-10-CM | POA: Diagnosis present

## 2022-09-16 DIAGNOSIS — J449 Chronic obstructive pulmonary disease, unspecified: Secondary | ICD-10-CM | POA: Diagnosis present

## 2022-09-16 DIAGNOSIS — I083 Combined rheumatic disorders of mitral, aortic and tricuspid valves: Secondary | ICD-10-CM | POA: Diagnosis present

## 2022-09-16 DIAGNOSIS — Z83438 Family history of other disorder of lipoprotein metabolism and other lipidemia: Secondary | ICD-10-CM

## 2022-09-16 DIAGNOSIS — F17290 Nicotine dependence, other tobacco product, uncomplicated: Secondary | ICD-10-CM | POA: Diagnosis present

## 2022-09-16 DIAGNOSIS — I959 Hypotension, unspecified: Secondary | ICD-10-CM | POA: Diagnosis not present

## 2022-09-16 DIAGNOSIS — I252 Old myocardial infarction: Secondary | ICD-10-CM

## 2022-09-16 DIAGNOSIS — Z91040 Latex allergy status: Secondary | ICD-10-CM

## 2022-09-16 DIAGNOSIS — I2510 Atherosclerotic heart disease of native coronary artery without angina pectoris: Secondary | ICD-10-CM | POA: Diagnosis present

## 2022-09-16 DIAGNOSIS — Z955 Presence of coronary angioplasty implant and graft: Secondary | ICD-10-CM

## 2022-09-16 DIAGNOSIS — I5021 Acute systolic (congestive) heart failure: Secondary | ICD-10-CM | POA: Diagnosis present

## 2022-09-16 DIAGNOSIS — Z79899 Other long term (current) drug therapy: Secondary | ICD-10-CM

## 2022-09-16 DIAGNOSIS — Z7902 Long term (current) use of antithrombotics/antiplatelets: Secondary | ICD-10-CM

## 2022-09-16 DIAGNOSIS — M549 Dorsalgia, unspecified: Secondary | ICD-10-CM | POA: Diagnosis present

## 2022-09-16 DIAGNOSIS — Z8616 Personal history of COVID-19: Secondary | ICD-10-CM

## 2022-09-16 DIAGNOSIS — Z881 Allergy status to other antibiotic agents status: Secondary | ICD-10-CM

## 2022-09-16 DIAGNOSIS — E876 Hypokalemia: Secondary | ICD-10-CM | POA: Diagnosis present

## 2022-09-16 LAB — BASIC METABOLIC PANEL
Anion gap: 13 (ref 5–15)
BUN: 20 mg/dL (ref 6–20)
CO2: 20 mmol/L — ABNORMAL LOW (ref 22–32)
Calcium: 8.9 mg/dL (ref 8.9–10.3)
Chloride: 110 mmol/L (ref 98–111)
Creatinine, Ser: 1.17 mg/dL — ABNORMAL HIGH (ref 0.44–1.00)
GFR, Estimated: 56 mL/min — ABNORMAL LOW (ref >60)
Glucose, Bld: 370 mg/dL — ABNORMAL HIGH (ref 70–99)
Potassium: 4.5 mmol/L (ref 3.5–5.1)
Sodium: 143 mmol/L (ref 135–145)

## 2022-09-16 LAB — CBC
HCT: 43.2 % (ref 36.0–46.0)
Hemoglobin: 13.4 g/dL (ref 12.0–15.0)
MCH: 28.6 pg (ref 26.0–34.0)
MCHC: 31 g/dL (ref 30.0–36.0)
MCV: 92.1 fL (ref 80.0–100.0)
Platelets: 291 10*3/uL (ref 150–400)
RBC: 4.69 MIL/uL (ref 3.87–5.11)
RDW: 14.3 % (ref 11.5–15.5)
WBC: 9.4 10*3/uL (ref 4.0–10.5)
nRBC: 0 % (ref 0.0–0.2)

## 2022-09-16 LAB — I-STAT CHEM 8, ED
BUN: 21 mg/dL — ABNORMAL HIGH (ref 6–20)
Calcium, Ion: 1.15 mmol/L (ref 1.15–1.40)
Chloride: 112 mmol/L — ABNORMAL HIGH (ref 98–111)
Creatinine, Ser: 1 mg/dL (ref 0.44–1.00)
Glucose, Bld: 374 mg/dL — ABNORMAL HIGH (ref 70–99)
HCT: 43 % (ref 36.0–46.0)
Hemoglobin: 14.6 g/dL (ref 12.0–15.0)
Potassium: 4 mmol/L (ref 3.5–5.1)
Sodium: 145 mmol/L (ref 135–145)
TCO2: 21 mmol/L — ABNORMAL LOW (ref 22–32)

## 2022-09-16 LAB — TROPONIN I (HIGH SENSITIVITY): Troponin I (High Sensitivity): 1954 ng/L (ref <18)

## 2022-09-16 NOTE — ED Triage Notes (Signed)
Pt here from home via [facility] for sob and cp. Pt went to UC on Thursday and was dx w/ bronchitis, was given cough medication and prednisone. Pt took SL nitro at home, upon EMS arrival pt was diaphoretic and pale. EMS gave 324mg  ASA, and approx 224ml NS. Pt EKG showed frequent PVC's and intermittently in ventricular bigeminy. Hx of 2 MI's, 4 stents placed. 150/100, 100% on NRB, 120HR. Sob worse w/ movement, productive cough.

## 2022-09-16 NOTE — ED Provider Triage Note (Signed)
Emergency Medicine Provider Triage Evaluation Note  Julie Hernandez , a 51 y.o. female  was evaluated in triage.  Pt complains of chest pain and shortness of breath which been ongoing for 12 hours.  Patient also states that she feels like she has a bulge or mass in her upper abdomen which began around the time of the chest pain.  Patient has history of 2 previous MIs.  Patient was transported here by rate of EMS with pads in place.  Patient is currently on a nonrebreather.  Patient states he had a sinus infection last week and was placed on antibiotics and steroid and does feel somewhat better as far as the congestion goes.  Review of Systems  Positive: Chest pain, shortness of breath, abdominal pain, back pain Negative: Vomiting  Physical Exam  BP (!) 144/97   Pulse (!) 121   Temp 97.9 F (36.6 C) (Axillary)   Resp 20   SpO2 100%  Gen:   Awake, diaphoretic Resp:  Normal effort  MSK:   Moves extremities without difficulty  Other:    Medical Decision Making  Medically screening exam initiated at 10:06 PM.  Appropriate orders placed.  Julie Hernandez was informed that the remainder of the evaluation will be completed by another provider, this initial triage assessment does not replace that evaluation, and the importance of remaining in the ED until their evaluation is complete.     Ronny Bacon 09/16/22 2208

## 2022-09-17 ENCOUNTER — Telehealth (HOSPITAL_COMMUNITY): Payer: Self-pay

## 2022-09-17 ENCOUNTER — Inpatient Hospital Stay (HOSPITAL_COMMUNITY)
Admission: EM | Disposition: A | Discharge status: Home / Self Care | Payer: Self-pay | Source: Home / Self Care | Attending: Internal Medicine

## 2022-09-17 ENCOUNTER — Encounter (HOSPITAL_COMMUNITY): Payer: Self-pay | Admitting: Internal Medicine

## 2022-09-17 ENCOUNTER — Other Ambulatory Visit (HOSPITAL_COMMUNITY): Payer: Self-pay

## 2022-09-17 ENCOUNTER — Other Ambulatory Visit (HOSPITAL_COMMUNITY): Payer: PPO

## 2022-09-17 ENCOUNTER — Inpatient Hospital Stay (HOSPITAL_COMMUNITY): Payer: PPO

## 2022-09-17 ENCOUNTER — Emergency Department (HOSPITAL_COMMUNITY): Payer: PPO

## 2022-09-17 DIAGNOSIS — I083 Combined rheumatic disorders of mitral, aortic and tricuspid valves: Secondary | ICD-10-CM | POA: Diagnosis present

## 2022-09-17 DIAGNOSIS — E1165 Type 2 diabetes mellitus with hyperglycemia: Secondary | ICD-10-CM | POA: Diagnosis present

## 2022-09-17 DIAGNOSIS — I2510 Atherosclerotic heart disease of native coronary artery without angina pectoris: Secondary | ICD-10-CM

## 2022-09-17 DIAGNOSIS — I214 Non-ST elevation (NSTEMI) myocardial infarction: Secondary | ICD-10-CM | POA: Diagnosis present

## 2022-09-17 DIAGNOSIS — I10 Essential (primary) hypertension: Secondary | ICD-10-CM

## 2022-09-17 DIAGNOSIS — I5021 Acute systolic (congestive) heart failure: Secondary | ICD-10-CM | POA: Diagnosis present

## 2022-09-17 DIAGNOSIS — I5022 Chronic systolic (congestive) heart failure: Secondary | ICD-10-CM

## 2022-09-17 DIAGNOSIS — Z8616 Personal history of COVID-19: Secondary | ICD-10-CM | POA: Diagnosis not present

## 2022-09-17 DIAGNOSIS — E119 Type 2 diabetes mellitus without complications: Secondary | ICD-10-CM | POA: Diagnosis not present

## 2022-09-17 DIAGNOSIS — Z794 Long term (current) use of insulin: Secondary | ICD-10-CM | POA: Diagnosis not present

## 2022-09-17 DIAGNOSIS — Z8249 Family history of ischemic heart disease and other diseases of the circulatory system: Secondary | ICD-10-CM | POA: Diagnosis not present

## 2022-09-17 DIAGNOSIS — Z6835 Body mass index (BMI) 35.0-35.9, adult: Secondary | ICD-10-CM | POA: Diagnosis not present

## 2022-09-17 DIAGNOSIS — E876 Hypokalemia: Secondary | ICD-10-CM | POA: Diagnosis present

## 2022-09-17 DIAGNOSIS — I5043 Acute on chronic combined systolic (congestive) and diastolic (congestive) heart failure: Secondary | ICD-10-CM

## 2022-09-17 DIAGNOSIS — I959 Hypotension, unspecified: Secondary | ICD-10-CM | POA: Diagnosis not present

## 2022-09-17 DIAGNOSIS — F17290 Nicotine dependence, other tobacco product, uncomplicated: Secondary | ICD-10-CM | POA: Diagnosis present

## 2022-09-17 DIAGNOSIS — Z79899 Other long term (current) drug therapy: Secondary | ICD-10-CM | POA: Diagnosis not present

## 2022-09-17 DIAGNOSIS — Z951 Presence of aortocoronary bypass graft: Secondary | ICD-10-CM | POA: Diagnosis not present

## 2022-09-17 DIAGNOSIS — Z83438 Family history of other disorder of lipoprotein metabolism and other lipidemia: Secondary | ICD-10-CM | POA: Diagnosis not present

## 2022-09-17 DIAGNOSIS — M549 Dorsalgia, unspecified: Secondary | ICD-10-CM | POA: Diagnosis present

## 2022-09-17 DIAGNOSIS — I252 Old myocardial infarction: Secondary | ICD-10-CM | POA: Diagnosis not present

## 2022-09-17 DIAGNOSIS — T82855A Stenosis of coronary artery stent, initial encounter: Secondary | ICD-10-CM | POA: Diagnosis present

## 2022-09-17 DIAGNOSIS — E669 Obesity, unspecified: Secondary | ICD-10-CM | POA: Diagnosis present

## 2022-09-17 DIAGNOSIS — J449 Chronic obstructive pulmonary disease, unspecified: Secondary | ICD-10-CM | POA: Diagnosis present

## 2022-09-17 DIAGNOSIS — I110 Hypertensive heart disease with heart failure: Secondary | ICD-10-CM | POA: Diagnosis present

## 2022-09-17 DIAGNOSIS — G8929 Other chronic pain: Secondary | ICD-10-CM | POA: Diagnosis present

## 2022-09-17 DIAGNOSIS — Z833 Family history of diabetes mellitus: Secondary | ICD-10-CM | POA: Diagnosis not present

## 2022-09-17 DIAGNOSIS — E785 Hyperlipidemia, unspecified: Secondary | ICD-10-CM | POA: Diagnosis present

## 2022-09-17 HISTORY — PX: RIGHT/LEFT HEART CATH AND CORONARY ANGIOGRAPHY: CATH118266

## 2022-09-17 LAB — BASIC METABOLIC PANEL
Anion gap: 12 (ref 5–15)
BUN: 16 mg/dL (ref 6–20)
CO2: 19 mmol/L — ABNORMAL LOW (ref 22–32)
Calcium: 8.7 mg/dL — ABNORMAL LOW (ref 8.9–10.3)
Chloride: 111 mmol/L (ref 98–111)
Creatinine, Ser: 0.98 mg/dL (ref 0.44–1.00)
GFR, Estimated: >60 mL/min (ref >60)
Glucose, Bld: 269 mg/dL — ABNORMAL HIGH (ref 70–99)
Potassium: 3.5 mmol/L (ref 3.5–5.1)
Sodium: 142 mmol/L (ref 135–145)

## 2022-09-17 LAB — POCT I-STAT EG7
Acid-base deficit: 2 mmol/L (ref 0.0–2.0)
Bicarbonate: 23.1 mmol/L (ref 20.0–28.0)
Calcium, Ion: 1.2 mmol/L (ref 1.15–1.40)
HCT: 37 % (ref 36.0–46.0)
Hemoglobin: 12.6 g/dL (ref 12.0–15.0)
O2 Saturation: 42 %
Potassium: 3.3 mmol/L — ABNORMAL LOW (ref 3.5–5.1)
Sodium: 140 mmol/L (ref 135–145)
TCO2: 24 mmol/L (ref 22–32)
pCO2, Ven: 41.6 mmHg — ABNORMAL LOW (ref 44–60)
pH, Ven: 7.352 (ref 7.25–7.43)
pO2, Ven: 25 mmHg — CL (ref 32–45)

## 2022-09-17 LAB — ECHOCARDIOGRAM COMPLETE
Area-P 1/2: 3.95 cm2
Calc EF: 20.5 %
Height: 62 in
S' Lateral: 5.5 cm
Single Plane A2C EF: 22.8 %
Single Plane A4C EF: 18.1 %
Weight: 3040 oz

## 2022-09-17 LAB — POCT I-STAT 7, (LYTES, BLD GAS, ICA,H+H)
Acid-base deficit: 4 mmol/L — ABNORMAL HIGH (ref 0.0–2.0)
Bicarbonate: 20.7 mmol/L (ref 20.0–28.0)
Calcium, Ion: 1.17 mmol/L (ref 1.15–1.40)
HCT: 36 % (ref 36.0–46.0)
Hemoglobin: 12.2 g/dL (ref 12.0–15.0)
O2 Saturation: 83 %
Potassium: 3.3 mmol/L — ABNORMAL LOW (ref 3.5–5.1)
Sodium: 140 mmol/L (ref 135–145)
TCO2: 22 mmol/L (ref 22–32)
pCO2 arterial: 36.8 mmHg (ref 32–48)
pH, Arterial: 7.359 (ref 7.35–7.45)
pO2, Arterial: 49 mmHg — ABNORMAL LOW (ref 83–108)

## 2022-09-17 LAB — TROPONIN I (HIGH SENSITIVITY): Troponin I (High Sensitivity): 2501 ng/L (ref <18)

## 2022-09-17 LAB — LIPID PANEL
Cholesterol: 157 mg/dL (ref 0–200)
HDL: 33 mg/dL — ABNORMAL LOW (ref >40)
LDL Cholesterol: 86 mg/dL (ref 0–99)
Total CHOL/HDL Ratio: 4.8 RATIO
Triglycerides: 188 mg/dL — ABNORMAL HIGH (ref <150)
VLDL: 38 mg/dL (ref 0–40)

## 2022-09-17 LAB — GLUCOSE, CAPILLARY: Glucose-Capillary: 270 mg/dL — ABNORMAL HIGH (ref 70–99)

## 2022-09-17 LAB — CBC
HCT: 39.6 % (ref 36.0–46.0)
Hemoglobin: 12.1 g/dL (ref 12.0–15.0)
MCH: 27.8 pg (ref 26.0–34.0)
MCHC: 30.6 g/dL (ref 30.0–36.0)
MCV: 91 fL (ref 80.0–100.0)
Platelets: 264 10*3/uL (ref 150–400)
RBC: 4.35 MIL/uL (ref 3.87–5.11)
RDW: 14.4 % (ref 11.5–15.5)
WBC: 8.4 10*3/uL (ref 4.0–10.5)
nRBC: 0 % (ref 0.0–0.2)

## 2022-09-17 LAB — HEPARIN LEVEL (UNFRACTIONATED): Heparin Unfractionated: 0.24 IU/mL — ABNORMAL LOW (ref 0.30–0.70)

## 2022-09-17 SURGERY — RIGHT/LEFT HEART CATH AND CORONARY ANGIOGRAPHY
Anesthesia: LOCAL

## 2022-09-17 MED ORDER — LIDOCAINE HCL (PF) 1 % IJ SOLN
INTRAMUSCULAR | Status: AC
  Filled 2022-09-17: qty 30

## 2022-09-17 MED ORDER — PANTOPRAZOLE SODIUM 40 MG PO TBEC: 40.0000 mg | DELAYED_RELEASE_TABLET | Freq: Every day | ORAL | Status: DC

## 2022-09-17 MED ORDER — CARVEDILOL 3.125 MG PO TABS
3.1250 mg | ORAL_TABLET | Freq: Two times a day (BID) | ORAL | Status: DC
  Administered 2022-09-17 (×2): 3.125 mg via ORAL
  Filled 2022-09-17 (×2): qty 1

## 2022-09-17 MED ORDER — SODIUM CHLORIDE 0.9 % IV SOLN: INTRAVENOUS | Status: DC

## 2022-09-17 MED ORDER — CLOPIDOGREL BISULFATE 75 MG PO TABS
75.0000 mg | ORAL_TABLET | Freq: Every day | ORAL | Status: DC
  Administered 2022-09-17 – 2022-09-20 (×4): 75 mg via ORAL
  Filled 2022-09-17 (×3): qty 1

## 2022-09-17 MED ORDER — MIDAZOLAM HCL 2 MG/2ML IJ SOLN
INTRAMUSCULAR | Status: DC | PRN
  Administered 2022-09-17 (×2): 1 mg via INTRAVENOUS

## 2022-09-17 MED ORDER — POTASSIUM CHLORIDE CRYS ER 20 MEQ PO TBCR
40.0000 meq | EXTENDED_RELEASE_TABLET | Freq: Once | ORAL | Status: AC
  Administered 2022-09-17: 40 meq via ORAL
  Filled 2022-09-17: qty 2

## 2022-09-17 MED ORDER — HEPARIN BOLUS VIA INFUSION
4000.0000 [IU] | Freq: Once | INTRAVENOUS | Status: AC
  Administered 2022-09-17: 4000 [IU] via INTRAVENOUS
  Filled 2022-09-17: qty 4000

## 2022-09-17 MED ORDER — ESCITALOPRAM OXALATE 10 MG PO TABS
10.0000 mg | ORAL_TABLET | Freq: Every day | ORAL | Status: DC
  Administered 2022-09-17 – 2022-09-20 (×4): 10 mg via ORAL
  Filled 2022-09-17 (×4): qty 1

## 2022-09-17 MED ORDER — MIDAZOLAM HCL 2 MG/2ML IJ SOLN
INTRAMUSCULAR | Status: AC
  Filled 2022-09-17: qty 2

## 2022-09-17 MED ORDER — FUROSEMIDE 10 MG/ML IJ SOLN
40.0000 mg | Freq: Two times a day (BID) | INTRAMUSCULAR | Status: AC
  Administered 2022-09-17 – 2022-09-19 (×3): 40 mg via INTRAVENOUS
  Filled 2022-09-17 (×3): qty 4

## 2022-09-17 MED ORDER — INSULIN GLARGINE-YFGN 100 UNIT/ML ~~LOC~~ SOLN
8.0000 [IU] | Freq: Every day | SUBCUTANEOUS | Status: DC
  Administered 2022-09-18 – 2022-09-19 (×2): 8 [IU] via SUBCUTANEOUS
  Filled 2022-09-17 (×5): qty 0.08

## 2022-09-17 MED ORDER — SPIRONOLACTONE 12.5 MG HALF TABLET
12.5000 mg | ORAL_TABLET | Freq: Every day | ORAL | Status: DC
  Administered 2022-09-17 – 2022-09-18 (×2): 12.5 mg via ORAL
  Filled 2022-09-17 (×3): qty 1

## 2022-09-17 MED ORDER — ONDANSETRON 4 MG PO TBDP: 4.0000 mg | ORAL_TABLET | Freq: Three times a day (TID) | ORAL | Status: DC | PRN

## 2022-09-17 MED ORDER — NITROGLYCERIN 0.4 MG SL SUBL: 0.4000 mg | SUBLINGUAL_TABLET | SUBLINGUAL | Status: DC | PRN

## 2022-09-17 MED ORDER — CLOPIDOGREL BISULFATE 75 MG PO TABS
ORAL_TABLET | ORAL | Status: AC
  Filled 2022-09-17: qty 1

## 2022-09-17 MED ORDER — ENOXAPARIN SODIUM 40 MG/0.4ML IJ SOSY
40.0000 mg | PREFILLED_SYRINGE | INTRAMUSCULAR | Status: DC
  Administered 2022-09-18 – 2022-09-19 (×2): 40 mg via SUBCUTANEOUS
  Filled 2022-09-17 (×3): qty 0.4

## 2022-09-17 MED ORDER — EZETIMIBE 10 MG PO TABS
10.0000 mg | ORAL_TABLET | Freq: Every day | ORAL | Status: DC
  Administered 2022-09-17 – 2022-09-20 (×4): 10 mg via ORAL
  Filled 2022-09-17 (×4): qty 1

## 2022-09-17 MED ORDER — HEPARIN (PORCINE) IN NACL 1000-0.9 UT/500ML-% IV SOLN
INTRAVENOUS | Status: AC
  Filled 2022-09-17: qty 1000

## 2022-09-17 MED ORDER — FENTANYL CITRATE (PF) 100 MCG/2ML IJ SOLN
INTRAMUSCULAR | Status: AC
  Filled 2022-09-17: qty 2

## 2022-09-17 MED ORDER — HYDRALAZINE HCL 20 MG/ML IJ SOLN: 10.0000 mg | INTRAMUSCULAR | Status: AC | PRN

## 2022-09-17 MED ORDER — SODIUM CHLORIDE 0.9% FLUSH: 3.0000 mL | INTRAVENOUS | Status: DC | PRN

## 2022-09-17 MED ORDER — SODIUM CHLORIDE 0.9 % WEIGHT BASED INFUSION: 3.0000 mL/kg/h | INTRAVENOUS | Status: DC

## 2022-09-17 MED ORDER — SODIUM CHLORIDE 0.9% FLUSH
3.0000 mL | Freq: Two times a day (BID) | INTRAVENOUS | Status: DC
  Administered 2022-09-17 – 2022-09-18 (×2): 3 mL via INTRAVENOUS

## 2022-09-17 MED ORDER — SODIUM CHLORIDE 0.9% FLUSH: 3.0000 mL | Freq: Two times a day (BID) | INTRAVENOUS | Status: DC

## 2022-09-17 MED ORDER — FUROSEMIDE 10 MG/ML IJ SOLN
40.0000 mg | Freq: Once | INTRAMUSCULAR | Status: AC
  Administered 2022-09-17: 40 mg via INTRAVENOUS
  Filled 2022-09-17: qty 4

## 2022-09-17 MED ORDER — FENTANYL CITRATE (PF) 100 MCG/2ML IJ SOLN
INTRAMUSCULAR | Status: DC | PRN
  Administered 2022-09-17: 25 ug via INTRAVENOUS
  Administered 2022-09-17: 50 ug via INTRAVENOUS

## 2022-09-17 MED ORDER — ASPIRIN 81 MG PO CHEW
81.0000 mg | CHEWABLE_TABLET | ORAL | Status: AC
  Administered 2022-09-17: 81 mg via ORAL
  Filled 2022-09-17: qty 1

## 2022-09-17 MED ORDER — LOSARTAN POTASSIUM 25 MG PO TABS
25.0000 mg | ORAL_TABLET | Freq: Every day | ORAL | Status: DC
  Administered 2022-09-17 – 2022-09-20 (×4): 25 mg via ORAL
  Filled 2022-09-17 (×4): qty 1

## 2022-09-17 MED ORDER — PERFLUTREN LIPID MICROSPHERE
1.0000 mL | INTRAVENOUS | Status: AC | PRN
  Administered 2022-09-17: 6 mL via INTRAVENOUS

## 2022-09-17 MED ORDER — SODIUM CHLORIDE 0.9 % IV SOLN: 250.0000 mL | INTRAVENOUS | Status: DC | PRN

## 2022-09-17 MED ORDER — LISINOPRIL 10 MG PO TABS
5.0000 mg | ORAL_TABLET | Freq: Every day | ORAL | Status: DC
  Filled 2022-09-17: qty 1

## 2022-09-17 MED ORDER — ASPIRIN 81 MG PO TBEC
81.0000 mg | DELAYED_RELEASE_TABLET | Freq: Every day | ORAL | Status: DC
  Administered 2022-09-18 – 2022-09-20 (×3): 81 mg via ORAL
  Filled 2022-09-17 (×3): qty 1

## 2022-09-17 MED ORDER — NITROGLYCERIN IN D5W 200-5 MCG/ML-% IV SOLN
0.0000 ug/min | INTRAVENOUS | Status: DC
  Administered 2022-09-17: 5 ug/min via INTRAVENOUS
  Filled 2022-09-17: qty 250

## 2022-09-17 MED ORDER — ACETAMINOPHEN 325 MG PO TABS
650.0000 mg | ORAL_TABLET | ORAL | Status: DC | PRN
  Administered 2022-09-17 – 2022-09-19 (×3): 650 mg via ORAL
  Filled 2022-09-17 (×3): qty 2

## 2022-09-17 MED ORDER — IOHEXOL 350 MG/ML SOLN
100.0000 mL | Freq: Once | INTRAVENOUS | Status: AC | PRN
  Administered 2022-09-17: 100 mL via INTRAVENOUS

## 2022-09-17 MED ORDER — DOXYCYCLINE HYCLATE 100 MG PO TABS
100.0000 mg | ORAL_TABLET | Freq: Two times a day (BID) | ORAL | Status: DC
  Administered 2022-09-17 – 2022-09-20 (×7): 100 mg via ORAL
  Filled 2022-09-17 (×7): qty 1

## 2022-09-17 MED ORDER — SODIUM CHLORIDE 0.9 % WEIGHT BASED INFUSION: 1.0000 mL/kg/h | INTRAVENOUS | Status: DC

## 2022-09-17 MED ORDER — FUROSEMIDE 10 MG/ML IJ SOLN
INTRAMUSCULAR | Status: AC
  Filled 2022-09-17: qty 4

## 2022-09-17 MED ORDER — HEPARIN (PORCINE) 25000 UT/250ML-% IV SOLN
1150.0000 [IU]/h | INTRAVENOUS | Status: DC
  Administered 2022-09-17: 1000 [IU]/h via INTRAVENOUS
  Filled 2022-09-17: qty 250

## 2022-09-17 SURGICAL SUPPLY — 14 items
CATH 5FR JL3.5 JR4 ANG PIG MP (CATHETERS) IMPLANT
CATH SWAN GANZ 7F STRAIGHT (CATHETERS) IMPLANT
CLOSURE MYNX CONTROL 5F (Vascular Products) IMPLANT
DEVICE CLOSURE MYNXGRIP 5F (Vascular Products) IMPLANT
KIT HEART LEFT (KITS) ×1 IMPLANT
KIT MICROPUNCTURE NIT STIFF (SHEATH) IMPLANT
PACK CARDIAC CATHETERIZATION (CUSTOM PROCEDURE TRAY) ×1 IMPLANT
SHEATH PINNACLE 5F 10CM (SHEATH) IMPLANT
SHEATH PINNACLE 7F 10CM (SHEATH) IMPLANT
SHEATH PROBE COVER 6X72 (BAG) IMPLANT
TRANSDUCER W/STOPCOCK (MISCELLANEOUS) ×1 IMPLANT
TUBING CIL FLEX 10 FLL-RA (TUBING) ×1 IMPLANT
WIRE EMERALD 3MM-J .035X150CM (WIRE) IMPLANT
WIRE HI TORQ VERSACORE-J 145CM (WIRE) IMPLANT

## 2022-09-17 NOTE — Telephone Encounter (Signed)
Pharmacy Patient Advocate Encounter  Insurance verification completed.    The patient is insured through [facility]   The patient is currently admitted and ran test claims for the following: Farxiga, Jardiance, Entresto.  Copays and coinsurance results were relayed to Inpatient clinical team.

## 2022-09-17 NOTE — Progress Notes (Signed)
Pt coughed up small amount of pink tinged sputum. No respiratory distress. No c/o discomfort. Dr. Haroldine Laws paged and made aware.

## 2022-09-17 NOTE — H&P (View-Only) (Signed)
Rounding Note    Patient Name: Julie Hernandez Date of Encounter: 09/17/2022  [place] Cardiologist: Glori Bickers, MD   Subjective   Some difficulty lying flat. No chest pain. Able to lie flat without significant hypoxia.  Lasix was ordered last PM and according to the patient there has been significant urination.  Inpatient Medications    Scheduled Meds:  [START ON 09/18/2022] aspirin EC  81 mg Oral Daily   carvedilol  3.125 mg Oral BID WC   doxycycline  100 mg Oral BID   escitalopram  10 mg Oral Daily   ezetimibe  10 mg Oral Daily   insulin glargine-yfgn  8 Units Subcutaneous QHS   lisinopril  5 mg Oral Daily   spironolactone  12.5 mg Oral Daily   Continuous Infusions:  heparin 1,000 Units/hr (09/17/22 0243)   nitroGLYCERIN 5 mcg/min (09/17/22 0448)   PRN Meds: acetaminophen, nitroGLYCERIN, ondansetron   Vital Signs    Vitals:   09/17/22 0657 09/17/22 0739 09/17/22 0740 09/17/22 0800  BP:  (!) 110/93  117/82  Pulse:  98 94 100  Resp:  (!) 22 (!) 22 18  Temp: 98.9 F (37.2 C)     TempSrc: Oral     SpO2:  100% 94% 96%  Weight:      Height:       No intake or output data in the 24 hours ending 09/17/22 0847    09/17/2022    2:00 AM 10/19/2021   10:03 AM 04/14/2021    8:42 AM  Last 3 Weights  Weight (lbs) 190 lb 184 lb 176 lb  Weight (kg) 86.183 kg 83.462 kg 79.833 kg      Telemetry    NSR - Personally Reviewed  ECG    NSR with ILBBB. Conduction abnormality is new. - Personally Reviewed  Physical Exam  Obese. GEN: No acute distress.   Neck: Neck veins difficult to assess but probably up. Cardiac: RRR, no murmurs, rubs, with S4 gallop.  Respiratory: Clear to auscultation bilaterally. GI: Soft, nontender, non-distended  MS: No edema; No deformity. Neuro:  Nonfocal  Psych: Normal affect   Labs    High Sensitivity Troponin:   Recent Labs  Lab 09/16/22 2215 09/17/22 0150  TROPONINIHS 1,954* 2,501*     Chemistry Recent  Labs  Lab 09/16/22 2215 09/16/22 2236 09/17/22 0659  NA 143 145 142  K 4.5 4.0 3.5  CL 110 112* 111  CO2 20*  --  19*  GLUCOSE 370* 374* 269*  BUN 20 21* 16  CREATININE 1.17* 1.00 0.98  CALCIUM 8.9  --  8.7*  GFRNONAA 56*  --  >60  ANIONGAP 13  --  12    Lipids  Recent Labs  Lab 09/17/22 0659  CHOL 157  TRIG 188*  HDL 33*  LDLCALC 86  CHOLHDL 4.8    Hematology Recent Labs  Lab 09/16/22 2215 09/16/22 2236 09/17/22 0659  WBC 9.4  --  8.4  RBC 4.69  --  4.35  HGB 13.4 14.6 12.1  HCT 43.2 43.0 39.6  MCV 92.1  --  91.0  MCH 28.6  --  27.8  MCHC 31.0  --  30.6  RDW 14.3  --  14.4  PLT 291  --  264   Thyroid No results for input(s): "TSH", "FREET4" in the last 168 hours.  BNPNo results for input(s): "BNP", "PROBNP" in the last 168 hours.  DDimer No results for input(s): "DDIMER" in the last 168 hours.  Hgb A1C: 13.3  Radiology    CT Angio Chest/Abd/Pel for Dissection W and/or W/WO  Result Date: 09/17/2022 CLINICAL DATA:  Aortic aneurysm, known or suspected. Shortness of breath, chest pain EXAM: CT ANGIOGRAPHY CHEST, ABDOMEN AND PELVIS TECHNIQUE: Non-contrast CT of the chest was initially obtained. Multidetector CT imaging through the chest, abdomen and pelvis was performed using the standard protocol during bolus administration of intravenous contrast. Multiplanar reconstructed images and MIPs were obtained and reviewed to evaluate the vascular anatomy. RADIATION DOSE REDUCTION: This exam was performed according to the departmental dose-optimization program which includes automated exposure control, adjustment of the mA and/or kV according to patient size and/or use of iterative reconstruction technique. CONTRAST:  182mL OMNIPAQUE IOHEXOL 350 MG/ML SOLN COMPARISON:  01/05/2021 CT abdomen and pelvis. 02/06/2020 CT angio chest. FINDINGS: CTA CHEST FINDINGS Cardiovascular: Heart is normal size. Aorta is normal caliber. No evidence of aortic dissection. No pulmonary  embolus. Mediastinum/Nodes: Mildly prominent mediastinal lymph nodes. Right paratracheal lymph node has a short axis diameter of 12 mm. Prevascular lymph nodes have a short axis diameter of 8 mm. These are stable since 2021 chest CT. No axillary or hilar adenopathy. Lungs/Pleura: Moderate bilateral pleural effusions. Ground-glass airspace opacities within the lungs. More confluent bibasilar opacities. Musculoskeletal: Chest wall soft tissues are unremarkable. No acute bony abnormality. Review of the MIP images confirms the above findings. CTA ABDOMEN AND PELVIS FINDINGS VASCULAR Aorta: Mild atherosclerotic irregularity. No aneurysm or dissection. Celiac: Patent without evidence of aneurysm, dissection, vasculitis or significant stenosis. SMA: Patent without evidence of aneurysm, dissection, vasculitis or significant stenosis. Renals: Both renal arteries are patent without evidence of aneurysm, dissection, vasculitis, fibromuscular dysplasia or significant stenosis. IMA: Patent without evidence of aneurysm, dissection, vasculitis or significant stenosis. Inflow: Atherosclerotic calcifications and irregularity. No aneurysm or dissection. Veins: No obvious venous abnormality within the limitations of this arterial phase study. Review of the MIP images confirms the above findings. NON-VASCULAR Hepatobiliary: No focal liver abnormality is seen. Status post cholecystectomy. No biliary dilatation. Pancreas: No focal abnormality or ductal dilatation. Spleen: No focal abnormality.  Normal size. Adrenals/Urinary Tract: No adrenal abnormality. No focal renal abnormality. No stones or hydronephrosis. Urinary bladder is unremarkable. Stomach/Bowel: Stomach, large and small bowel grossly unremarkable. Lymphatic: No adenopathy Reproductive: Uterus and adnexa unremarkable. No mass. IUD in the uterus. Other: No free fluid or free air. Musculoskeletal: No acute bony abnormality. Review of the MIP images confirms the above findings.  IMPRESSION: No evidence of aortic aneurysm or dissection. Moderate atherosclerosis in the infrarenal abdominal aorta and iliac vessels. No evidence of pulmonary embolus. Moderate bilateral pleural effusions. Bilateral ground-glass airspace opacities and more confluent opacities in the lower lobes. This is concerning for pneumonia. No acute findings in the abdomen or pelvis. Electronically Signed   By: Rolm Baptise M.D.   On: 09/17/2022 01:04   DG Chest 2 View  Result Date: 09/16/2022 CLINICAL DATA:  Chest pain EXAM: CHEST - 2 VIEW COMPARISON:  01/05/2021 FINDINGS: Lung volumes are small and pulmonary insufflation has decreased in the interval since prior examination. There has developed small bilateral pleural effusions and diffuse interstitial pulmonary infiltrate in keeping with moderate cardiogenic failure. No pneumothorax. Cardiac size is within normal limits. Coronary artery stenting has been performed. No acute bone abnormality. IMPRESSION: 1. Moderate cardiogenic failure with small bilateral pleural effusions. 2. Pulmonary hypoinflation. Electronically Signed   By: Fidela Salisbury M.D.   On: 09/16/2022 22:49    Cardiac Studies   ECHO pending  Patient Profile  51 y.o. female old woman with a history of remote anterior MI in 2010 s/p 2 LAD stents, then NSTEMI in 2021 with severe prox LAD ISR at a diagonal branch bifurcation, high-grade proximal Lcx disease, and borderline mid-RCA stenosis with plan for possible CABG. 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 Referred here for second opinion and underwent CMR that showed minimal viability of the anterior wall. She underwent PCI of the mid Lcx and prox LAD with POBA of the jailed diagonal branch. RFR of the RCA was negative. Most recent TTE 03/2021 shows improvement of her LVEF to 50-55%. She presents to the ED today with worsening dyspnea and chest pain.   Assessment & Plan  NSTEMI: in setting of progressive  dyspnea. Concern LAD or RCA may have progression. 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 Check BNP. Probably needs more diuresis. DM II: Poorly controlled, A1C is > 13. Primary hypertension: Controlled on IV NTG.  The patient was counseled to undergo left and right heart catheterization, coronary angiography, and possible percutaneous coronary intervention with stent implantation. The procedural risks and benefits were discussed in detail. The risks discussed included death, stroke, myocardial infarction, life-threatening bleeding, limb ischemia, kidney injury, allergy, and possible emergency cardiac surgery. The risk of these significant complications were estimated to occur less than 1% of the time. After discussion, the patient has agreed to proceed.  Left and right radial pulses are weak. May need to be femoral but CHF and prolonged recumbancy may be difficult.   For questions or updates, please contact [place] Please consult [email or web address] for contact info under        Signed, Sinclair Grooms, MD  09/17/2022, 8:47 AM

## 2022-09-17 NOTE — ED Notes (Signed)
Echo at bedside

## 2022-09-17 NOTE — Interval H&P Note (Signed)
History and Physical Interval Note:  09/17/2022 11:17 AM  Julie Hernandez  has presented today for surgery, with the diagnosis of NSTEMI and acute HFrEF.  The various methods of treatment have been discussed with the patient and family. After consideration of risks, benefits and other options for treatment, the patient has consented to  Procedure(s): RIGHT/LEFT HEART CATH AND CORONARY ANGIOGRAPHY (N/A) as a surgical intervention.  The patient's history has been reviewed, patient examined, no change in status, stable for surgery.  I have reviewed the patient's chart and labs.  Questions were answered to the patient's satisfaction.    Cath Lab Visit (complete for each Cath Lab visit)  Clinical Evaluation Leading to the Procedure:   ACS: Yes.    Non-ACS:  N/A  Richrd Kuzniar

## 2022-09-17 NOTE — Progress Notes (Addendum)
Rounding Note    Patient Name: Julie Hernandez Date of Encounter: 09/17/2022  [facility] Cardiologist: Glori Bickers, MD   Subjective   Some difficulty lying flat. No chest pain. Able to lie flat without significant hypoxia.  Lasix was ordered last PM and according to the patient there has been significant urination.  Inpatient Medications    Scheduled Meds:  [START ON 09/18/2022] aspirin EC  81 mg Oral Daily   carvedilol  3.125 mg Oral BID WC   doxycycline  100 mg Oral BID   escitalopram  10 mg Oral Daily   ezetimibe  10 mg Oral Daily   insulin glargine-yfgn  8 Units Subcutaneous QHS   lisinopril  5 mg Oral Daily   spironolactone  12.5 mg Oral Daily   Continuous Infusions:  heparin 1,000 Units/hr (09/17/22 0243)   nitroGLYCERIN 5 mcg/min (09/17/22 0448)   PRN Meds: acetaminophen, nitroGLYCERIN, ondansetron   Vital Signs    Vitals:   09/17/22 0657 09/17/22 0739 09/17/22 0740 09/17/22 0800  BP:  (!) 110/93  117/82  Pulse:  98 94 100  Resp:  (!) 22 (!) 22 18  Temp: 98.9 F (37.2 C)     TempSrc: Oral     SpO2:  100% 94% 96%  Weight:      Height:       No intake or output data in the 24 hours ending 09/17/22 0847    09/17/2022    2:00 AM 10/19/2021   10:03 AM 04/14/2021    8:42 AM  Last 3 Weights  Weight (lbs) 190 lb 184 lb 176 lb  Weight (kg) 86.183 kg 83.462 kg 79.833 kg      Telemetry    NSR - Personally Reviewed  ECG    NSR with ILBBB. Conduction abnormality is new. - Personally Reviewed  Physical Exam  Obese. GEN: No acute distress.   Neck: Neck veins difficult to assess but probably up. Cardiac: RRR, no murmurs, rubs, with S4 gallop.  Respiratory: Clear to auscultation bilaterally. GI: Soft, nontender, non-distended  MS: No edema; No deformity. Neuro:  Nonfocal  Psych: Normal affect   Labs    High Sensitivity Troponin:   Recent Labs  Lab 09/16/22 2215 09/17/22 0150  TROPONINIHS 1,954* 2,501*     Chemistry Recent  Labs  Lab 09/16/22 2215 09/16/22 2236 09/17/22 0659  NA 143 145 142  K 4.5 4.0 3.5  CL 110 112* 111  CO2 20*  --  19*  GLUCOSE 370* 374* 269*  BUN 20 21* 16  CREATININE 1.17* 1.00 0.98  CALCIUM 8.9  --  8.7*  GFRNONAA 56*  --  >60  ANIONGAP 13  --  12    Lipids  Recent Labs  Lab 09/17/22 0659  CHOL 157  TRIG 188*  HDL 33*  LDLCALC 86  CHOLHDL 4.8    Hematology Recent Labs  Lab 09/16/22 2215 09/16/22 2236 09/17/22 0659  WBC 9.4  --  8.4  RBC 4.69  --  4.35  HGB 13.4 14.6 12.1  HCT 43.2 43.0 39.6  MCV 92.1  --  91.0  MCH 28.6  --  27.8  MCHC 31.0  --  30.6  RDW 14.3  --  14.4  PLT 291  --  264   Thyroid No results for input(s): "TSH", "FREET4" in the last 168 hours.  BNPNo results for input(s): "BNP", "PROBNP" in the last 168 hours.  DDimer No results for input(s): "DDIMER" in the last 168 hours.  Hgb A1C: 13.3  Radiology    CT Angio Chest/Abd/Pel for Dissection W and/or W/WO  Result Date: 09/17/2022 CLINICAL DATA:  Aortic aneurysm, known or suspected. Shortness of breath, chest pain EXAM: CT ANGIOGRAPHY CHEST, ABDOMEN AND PELVIS TECHNIQUE: Non-contrast CT of the chest was initially obtained. Multidetector CT imaging through the chest, abdomen and pelvis was performed using the standard protocol during bolus administration of intravenous contrast. Multiplanar reconstructed images and MIPs were obtained and reviewed to evaluate the vascular anatomy. RADIATION DOSE REDUCTION: This exam was performed according to the departmental dose-optimization program which includes automated exposure control, adjustment of the mA and/or kV according to patient size and/or use of iterative reconstruction technique. CONTRAST:  126mL OMNIPAQUE IOHEXOL 350 MG/ML SOLN COMPARISON:  01/05/2021 CT abdomen and pelvis. 02/06/2020 CT angio chest. FINDINGS: CTA CHEST FINDINGS Cardiovascular: Heart is normal size. Aorta is normal caliber. No evidence of aortic dissection. No pulmonary  embolus. Mediastinum/Nodes: Mildly prominent mediastinal lymph nodes. Right paratracheal lymph node has a short axis diameter of 12 mm. Prevascular lymph nodes have a short axis diameter of 8 mm. These are stable since 2021 chest CT. No axillary or hilar adenopathy. Lungs/Pleura: Moderate bilateral pleural effusions. Ground-glass airspace opacities within the lungs. More confluent bibasilar opacities. Musculoskeletal: Chest wall soft tissues are unremarkable. No acute bony abnormality. Review of the MIP images confirms the above findings. CTA ABDOMEN AND PELVIS FINDINGS VASCULAR Aorta: Mild atherosclerotic irregularity. No aneurysm or dissection. Celiac: Patent without evidence of aneurysm, dissection, vasculitis or significant stenosis. SMA: Patent without evidence of aneurysm, dissection, vasculitis or significant stenosis. Renals: Both renal arteries are patent without evidence of aneurysm, dissection, vasculitis, fibromuscular dysplasia or significant stenosis. IMA: Patent without evidence of aneurysm, dissection, vasculitis or significant stenosis. Inflow: Atherosclerotic calcifications and irregularity. No aneurysm or dissection. Veins: No obvious venous abnormality within the limitations of this arterial phase study. Review of the MIP images confirms the above findings. NON-VASCULAR Hepatobiliary: No focal liver abnormality is seen. Status post cholecystectomy. No biliary dilatation. Pancreas: No focal abnormality or ductal dilatation. Spleen: No focal abnormality.  Normal size. Adrenals/Urinary Tract: No adrenal abnormality. No focal renal abnormality. No stones or hydronephrosis. Urinary bladder is unremarkable. Stomach/Bowel: Stomach, large and small bowel grossly unremarkable. Lymphatic: No adenopathy Reproductive: Uterus and adnexa unremarkable. No mass. IUD in the uterus. Other: No free fluid or free air. Musculoskeletal: No acute bony abnormality. Review of the MIP images confirms the above findings.  IMPRESSION: No evidence of aortic aneurysm or dissection. Moderate atherosclerosis in the infrarenal abdominal aorta and iliac vessels. No evidence of pulmonary embolus. Moderate bilateral pleural effusions. Bilateral ground-glass airspace opacities and more confluent opacities in the lower lobes. This is concerning for pneumonia. No acute findings in the abdomen or pelvis. Electronically Signed   By: Rolm Baptise M.D.   On: 09/17/2022 01:04   DG Chest 2 View  Result Date: 09/16/2022 CLINICAL DATA:  Chest pain EXAM: CHEST - 2 VIEW COMPARISON:  01/05/2021 FINDINGS: Lung volumes are small and pulmonary insufflation has decreased in the interval since prior examination. There has developed small bilateral pleural effusions and diffuse interstitial pulmonary infiltrate in keeping with moderate cardiogenic failure. No pneumothorax. Cardiac size is within normal limits. Coronary artery stenting has been performed. No acute bone abnormality. IMPRESSION: 1. Moderate cardiogenic failure with small bilateral pleural effusions. 2. Pulmonary hypoinflation. Electronically Signed   By: Fidela Salisbury M.D.   On: 09/16/2022 22:49    Cardiac Studies   ECHO pending  Patient Profile  51 y.o. female old woman with a history of remote anterior MI in 2010 s/p 2 LAD stents, then NSTEMI in 2021 with severe prox LAD ISR at a diagonal branch bifurcation, high-grade proximal Lcx disease, and borderline mid-RCA stenosis with plan for possible CABG. 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 Referred here for second opinion and underwent CMR that showed minimal viability of the anterior wall. She underwent PCI of the mid Lcx and prox LAD with POBA of the jailed diagonal branch. RFR of the RCA was negative. Most recent TTE 03/2021 shows improvement of her LVEF to 50-55%. She presents to the ED today with worsening dyspnea and chest pain.   Assessment & Plan  NSTEMI: in setting of progressive  dyspnea. Concern LAD or RCA may have progression. 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 Check BNP. Probably needs more diuresis. DM II: Poorly controlled, A1C is > 13. Primary hypertension: Controlled on IV NTG.  The patient was counseled to undergo left and right heart catheterization, coronary angiography, and possible percutaneous coronary intervention with stent implantation. The procedural risks and benefits were discussed in detail. The risks discussed included death, stroke, myocardial infarction, life-threatening bleeding, limb ischemia, kidney injury, allergy, and possible emergency cardiac surgery. The risk of these significant complications were estimated to occur less than 1% of the time. After discussion, the patient has agreed to proceed.  Left and right radial pulses are weak. May need to be femoral but CHF and prolonged recumbancy may be difficult.   For questions or updates, please contact [place] Please consult [email or web address] for contact info under        Signed, Sinclair Grooms, MD  09/17/2022, 8:47 AM

## 2022-09-17 NOTE — Consult Note (Addendum)
Advanced Heart Failure Team Consult Note   Primary Physician: Truett Mainland, FNP PCP-Cardiologist:  Glori Bickers, MD  Reason for Consultation: Acute on Chronic Systolic Heart Failure   HPI:    Julie Hernandez is seen today for evaluation of acute on chronic systolic heart failure at the request of Dr. Tamala Julian, Cardiology.   Julie Hernandez is a 51 y.o. 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   Had anterior MI in 2010 taken emergently to [facility]. Stent was placed emergently. Post stenting she continued to have CP. Went to [facility] that same year and had a second stent placed. Did well from a cardiac perspective until 3/21.    Cath 02/08/20 at [facility] for NSTEMI. 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 Was told she may need repeat stenting vs CABG. Referred to [facility] for second opinion.   We saw her in 5/21 with daily angina. Underwent MRI which showed EF 26% only minimal viability in anterior wall. Decision to proceed with PCI of LCX and LAD (ISR) over CABG. Had successful PCI of LCX and LAD with Dr. Burt Knack on 5/20.    Echo 9/21 EF 60-65%   Echo 04/06/21 EF 50-55% mild anterior and apical HK.  Presented to ED overnight w/ complaint of 1 wk h/o generalized malaise, fatigue and abdominal fullness, followed by acute onset of exertional dyspnea and chest pain on Saturday. Ruled in for NSTEMI, Hs trop 1,954>>2,501. CXR w/ small pleural effusions. Echo showed drop in LVEF down to 20%, RV mildly reduced, mild-mod MR. Subsequent cath today showed severe two vessel coronary artery disease, including 90% in-stent restenosis of mid LAD and chronic total occlusion of mid RCA with left-to-right collaterals, widely  patent mid LCx stent and severely elevated left and right heart filling pressures w/ mildly reduced cardiac output/index, RA 23, PCWP 30, CI 2,0, PA sat 42%.  PCI deferred in favor of optimizing medically first. AHF team consulted for assistance.     R/LHC 09/17/22 Conclusions: Severe two vessel coronary artery disease, including 90% in-stent restenosis of mid LAD and chronic total occlusion of mid RCA with left-to-right collaterals. Widely patent mid LCx stent. Severely elevated left and right heart filling pressures. Mildly reduced cardiac output/index. Occluded bilateral radial arteries.   Recommendations: Angiograms and hemodynamics reviewed with Dr. Tamala Julian.  We have agreed to defer PCI to LAD in-stent restenosis in favor of optimizing medical therapy.  Benefit of PCI to mid LAD is uncertain given prior cardiac MRI demonstrating mixture of viable and nonviable myocardium in the LAD territory on MRI in 2021. Escalate diuresus and goal directed medical therapy.  Lisinopril has been switched to losartan in the hope of transitioning to [facility] in the coming days. Aggressive secondary prevention of coronary artery disease.   Echo 09/17/22  EF 20%, RV mildly reduced, mild-mod MR   Review of Systems: [y] = yes, _0  = no   General: Weight gain _1 ; Weight loss _2 ; Anorexia _3 ; Fatigue [Y ]; Fever _4 ; Chills _5 ; Weakness _6   Cardiac: Chest pain/pressure [Y ]; Resting SOB [Y ]; Exertional SOB [Y ]; Orthopnea _7 ; Pedal Edema _8 ; Palpitations _9 ; Syncope _10 ;  Presyncope _0 ; Paroxysmal nocturnal dyspnea_1   Pulmonary: Cough _2 ; Wheezing_3 ; Hemoptysis_4 ; Sputum _5 ; Snoring _6   GI: Vomiting_7 ; Dysphagia_8 ; Melena_9 ; Hematochezia _10 ; Heartburn_11 ; Abdominal pain _12 ; Constipation _13 ; Diarrhea _14 ; BRBPR _15   GU: Hematuria_16 ; Dysuria _17 ; Nocturia_18   Vascular: Pain in legs with walking _19 ; Pain in feet with lying flat _20 ; Non-healing sores _21 ; Stroke _22 ; TIA _23 ; Slurred  speech _24 ;  Neuro: Headaches_25 ; Vertigo_26 ; Seizures_27 ; Paresthesias_28 ;Blurred vision _29 ; Diplopia _30 ; Vision changes _31   Ortho/Skin: Arthritis _32 ; Joint pain _33 ; Muscle pain _34 ; Joint swelling _35 ; Back Pain _36 ; Rash _37   Psych: Depression_38 ; Anxiety_39   Heme: Bleeding problems _40 ; Clotting disorders _41 ; Anemia _42   Endocrine: Diabetes [ Y]; Thyroid dysfunction_43   Home Medications Prior to Admission medications   Medication Sig Start Date End Date Taking? Authorizing Provider  albuterol (VENTOLIN HFA) 108 (90 Base) MCG/ACT inhaler Inhale 2 puffs into the lungs every 4 (four) hours as needed for wheezing or shortness of breath.   Yes [provider]  aspirin EC 81 MG tablet Take 81 mg by mouth daily.   Yes [provider]  clopidogrel (PLAVIX) 75 MG tablet Take 75 mg by mouth daily.   Yes [provider]  doxycycline (VIBRAMYCIN) 100 MG capsule Take 100 mg by mouth 2 (two) times daily. 09/13/22  Yes [provider]  escitalopram (LEXAPRO) 10 MG tablet Take 10 mg by mouth daily.   Yes [provider]  ezetimibe (ZETIA) 10 MG tablet Take 1 tablet (10 mg total) by mouth daily at 6pm. Patient taking differently: Take 10 mg by mouth daily. 03/30/22  Yes Nekita Pita, Shaune Pascal, MD  famotidine (PEPCID) 40 MG tablet Take 40 mg by mouth daily as needed for heartburn.   Yes [provider]  furosemide (LASIX) 20 MG tablet Take 2 tablets (40 mg total) by mouth every other day. 07/26/20  Yes Stephani Janak, Shaune Pascal, MD  insulin glargine (LANTUS) 100 UNIT/ML Solostar Pen Inject 8 Units into the skin daily. Further refills by PCP Patient taking differently: Inject 8 Units into the skin every evening. Further refills by PCP 10/18/20  Yes Philemon Kingdom, MD  lisinopril (ZESTRIL) 5 MG tablet Take 5 mg by mouth daily.   Yes [provider]  nitroGLYCERIN (NITROSTAT) 0.4 MG SL tablet PLACE 1 TABLET UNDER THE TONGUE AND ALLOW TO DISSOLVE  SLOWLY, DO NOT CHEW OR SWALLOW. YOU MAY USE ADDITIONAL TABLETS EVERY 5 MINUTES BUT NO MORE THAN 3 TABLETS. Patient taking differently: Place 0.4 mg under the tongue every 5 (five) minutes as needed for chest pain. 06/01/22  Yes Georgean Spainhower, Shaune Pascal, MD  ondansetron (ZOFRAN ODT) 4 MG disintegrating tablet Take 1 tablet (4 mg total) by mouth every 8 (eight) hours as needed for nausea or vomiting. 01/05/21  Yes Isla Pence, MD  promethazine-dextromethorphan (PROMETHAZINE-DM) 6.25-15 MG/5ML syrup Take 5 mLs by mouth daily as needed for cough. 09/13/22  Yes [provider]  RABEprazole (ACIPHEX) 20 MG tablet Take 40 mg by mouth daily.   Yes [provider]  spironolactone (ALDACTONE) 25 MG tablet Take 0.5 tablets (12.5 mg total) by mouth daily. 05/14/22  Yes Gicela Schwarting, Shaune Pascal, MD  blood glucose meter kit and supplies Dispense based on patient and insurance preference. Use up to four times daily  as directed. (FOR ICD-10 E10.9, E11.9). 04/08/20   Bhagat, Crista Luria, PA  empagliflozin (JARDIANCE) 10 MG TABS tablet Take 1 tablet (10 mg total) by mouth daily. Patient not taking: Reported on 09/17/2022 12/02/20   Jakeya Gherardi, Shaune Pascal, MD  metFORMIN (GLUCOPHAGE) 500 MG tablet Take 2 tablets (1,000 mg total) by mouth 2 (two) times daily with a meal. Patient not taking: Reported on 09/17/2022 05/12/20   Philemon Kingdom, MD    Past Medical History: Past Medical History:  Diagnosis Date   CHF (congestive heart failure) ([place])    Coronary artery disease    COVID 12/03/2020   Diabetes mellitus without complication ([place])    Hypertension     Past Surgical History: Past Surgical History:  Procedure Laterality Date   CORONARY BALLOON ANGIOPLASTY N/A 04/07/2020   Procedure: CORONARY BALLOON ANGIOPLASTY;  Surgeon: Sherren Mocha, MD;  Location: [place] [facility];  Service: Cardiovascular;  Laterality: N/A;   CORONARY STENT INTERVENTION N/A 04/07/2020   Procedure: CORONARY STENT INTERVENTION;   Surgeon: Sherren Mocha, MD;  Location: [place] [facility];  Service: Cardiovascular;  Laterality: N/A;   INTRAVASCULAR PRESSURE WIRE/FFR STUDY N/A 04/07/2020   Procedure: INTRAVASCULAR PRESSURE WIRE/FFR STUDY;  Surgeon: Sherren Mocha, MD;  Location: [place] [facility];  Service: Cardiovascular;  Laterality: N/A;   INTRAVASCULAR ULTRASOUND/IVUS N/A 04/07/2020   Procedure: Intravascular Ultrasound/IVUS;  Surgeon: Sherren Mocha, MD;  Location: [place] [facility];  Service: Cardiovascular;  Laterality: N/A;   LEFT HEART CATH AND CORONARY ANGIOGRAPHY N/A 04/07/2020   Procedure: LEFT HEART CATH AND CORONARY ANGIOGRAPHY;  Surgeon: Sherren Mocha, MD;  Location: [place] [facility];  Service: Cardiovascular;  Laterality: N/A;   LEFT HEART CATH AND CORONARY ANGIOGRAPHY N/A 04/14/2021   Procedure: LEFT HEART CATH AND CORONARY ANGIOGRAPHY;  Surgeon: Jolaine Artist, MD;  Location: [place] [facility];  Service: Cardiovascular;  Laterality: N/A;    Family History: Family History  Problem Relation Age of Onset   Diabetes Mother    Hypertension Mother    Heart disease Mother    Hyperlipidemia Mother     Social History: Social History   Socioeconomic History   Marital status: Divorced    Spouse name: Not on file   Number of children: 2   Years of education: Not on file   Highest education level: Not on file  Occupational History   Occupation: On disability  Tobacco Use   Smoking status: Former    Types: Cigarettes    Quit date: 02/18/2020    Years since quitting: 2.5   Smokeless tobacco: Never  Vaping Use   Vaping Use: Some days  Substance and Sexual Activity   Alcohol use: Yes    Comment: SOCIAL   Drug use: Never   Sexual activity: Not on file  Other Topics Concern   Not on file  Social History Narrative   Not on file   Social Determinants of Health   Financial Resource Strain: Not on file  Food Insecurity: Not on file  Transportation Needs: Not on file  Physical Activity:  Not on file  Stress: Not on file  Social Connections: Not on file    Allergies:  Allergies  Allergen Reactions   Esomeprazole Magnesium Anaphylaxis   Ciprofibrate Itching   Ciprofloxacin Itching   Gabapentin Other (See Comments)    Achy - myalgia   Lubiprostone Diarrhea   Statins     Myalgia - flu like symptoms   Amlodipine Rash   Iron Rash and Swelling  Latex Rash and Swelling   Povidone Iodine Rash    Objective:    Vital Signs:   Temp:  [97.9 F (36.6 C)-98.9 F (37.2 C)] 97.9 F (36.6 C) (10/30 1041) Pulse Rate:  [0-205] 82 (10/30 1435) Resp:  [12-26] 13 (10/30 1435) BP: (81-145)/(64-106) 126/83 (10/30 1435) SpO2:  [80 %-100 %] 94 % (10/30 1435) Weight:  [86.2 kg] 86.2 kg (10/30 0200)    Weight change: Filed Weights   09/17/22 0200  Weight: 86.2 kg    Intake/Output:   Intake/Output Summary (Last 24 hours) at 09/17/2022 1504 Last data filed at 09/17/2022 0947 Gross per 24 hour  Intake 115.74 ml  Output 1100 ml  Net -984.26 ml      Physical Exam    General:  Well appearing. No resp difficulty HEENT: normal Neck: supple. JVP . Carotids 2+ bilat; no bruits. No lymphadenopathy or thyromegaly appreciated. Cor: PMI nondisplaced. Regular rate & rhythm. No rubs, gallops or murmurs. Lungs: clear Abdomen: soft, nontender, nondistended. No hepatosplenomegaly. No bruits or masses. Good bowel sounds. Extremities: no cyanosis, clubbing, rash, edema Neuro: alert & orientedx3, cranial nerves grossly intact. moves all 4 extremities w/o difficulty. Affect pleasant   Telemetry   NSR 90s   EKG    Sinus tach w/ PVcs 123 bpm   Labs   Basic Metabolic Panel: Recent Labs  Lab 09/16/22 2215 09/16/22 2236 09/17/22 0659  NA 143 145 142  K 4.5 4.0 3.5  CL 110 112* 111  CO2 20*  --  19*  GLUCOSE 370* 374* 269*  BUN 20 21* 16  CREATININE 1.17* 1.00 0.98  CALCIUM 8.9  --  8.7*    Liver Function Tests: No results for input(s): "AST", "ALT", "ALKPHOS",  "BILITOT", "PROT", "ALBUMIN" in the last 168 hours. No results for input(s): "LIPASE", "AMYLASE" in the last 168 hours. No results for input(s): "AMMONIA" in the last 168 hours.  CBC: Recent Labs  Lab 09/16/22 2215 09/16/22 2236 09/17/22 0659  WBC 9.4  --  8.4  HGB 13.4 14.6 12.1  HCT 43.2 43.0 39.6  MCV 92.1  --  91.0  PLT 291  --  264    Cardiac Enzymes: No results for input(s): "CKTOTAL", "CKMB", "CKMBINDEX", "TROPONINI" in the last 168 hours.  BNP: BNP (last 3 results) No results for input(s): "BNP" in the last 8760 hours.  ProBNP (last 3 results) No results for input(s): "PROBNP" in the last 8760 hours.   CBG: Recent Labs  Lab 09/17/22 1500  GLUCAP 270*    Coagulation Studies: No results for input(s): "LABPROT", "INR" in the last 72 hours.   Imaging   CARDIAC CATHETERIZATION  Result Date: 09/17/2022 Conclusions: Severe two vessel coronary artery disease, including 90% in-stent restenosis of mid LAD and chronic total occlusion of mid RCA with left-to-right collaterals. Widely patent mid LCx stent. Severely elevated left and right heart filling pressures. Mildly reduced cardiac output/index. Occluded bilateral radial arteries. Recommendations: Angiograms and hemodynamics reviewed with Dr. Tamala Julian.  We have agreed to defer PCI to LAD in-stent restenosis in favor of optimizing medical therapy.  Benefit of PCI to mid LAD is uncertain given prior cardiac MRI demonstrating mixture of viable and nonviable myocardium in the LAD territory on MRI in 2021. Escalate diuresus and goal directed medical therapy.  Lisinopril has been switched to losartan in the hope of transitioning to [facility] in the coming days. Aggressive secondary prevention of coronary artery disease. Nelva Bush, MD [facility]  ECHOCARDIOGRAM COMPLETE  Result Date: 09/17/2022  ECHOCARDIOGRAM REPORT   Patient Name:   Julie Hernandez Date of Exam: 09/17/2022 Medical Rec #:  [id number]      Height:        62.0 in Accession #:    [id number]     Weight:       190.0 lb Date of Birth:  06/17/1971     BSA:          1.870 m Patient Age:    44 years       BP:           104/77 mmHg Patient Gender: F              HR:           99 bpm. Exam Location:  Inpatient Procedure: 2D Echo, Cardiac Doppler, Color Doppler and Intracardiac            Opacification Agent STAT ECHO Indications:    CHF-Acute Systolic T61.44  History:        Patient has prior history of Echocardiogram examinations, most                 recent 04/06/2021. CHF, CAD and NSTEMI, Prior CABG,                 Arrythmias:non-specific ST changes; Risk Factors:Diabetes and                 Hypertension.  Sonographer:    Darlina Sicilian RDCS Referring Phys: [place]  1. Left ventricular ejection fraction, by estimation, is 20%. The left ventricle has severely decreased function. The left ventricle demonstrates global hypokinesis. The left ventricular internal cavity size was moderately dilated. Indeterminate diastolic filling due to E-A fusion.  2. Right ventricular systolic function is mildly reduced. The right ventricular size is normal. Tricuspid regurgitation signal is inadequate for assessing PA pressure.  3. Left atrial size was mildly dilated.  4. The mitral valve is grossly normal. Mild to moderate mitral valve regurgitation with some splay artifact suggesting eccentric component to jet. No evidence of mitral stenosis.  5. The aortic valve is tricuspid. Aortic valve regurgitation is trivial. No aortic stenosis is present.  6. The inferior vena cava is normal in size with greater than 50% respiratory variability, suggesting right atrial pressure of 3 mmHg.  7. Cannot exclude a small PFO. Conclusion(s)/Recommendation(s): No left ventricular mural or apical thrombus/thrombi. FINDINGS  Left Ventricle: Left ventricular ejection fraction, by estimation, is 20%. The left ventricle has severely decreased function. The left ventricle demonstrates  global hypokinesis. Definity contrast agent was given IV to delineate the left ventricular endocardial borders. The left ventricular internal cavity size was moderately dilated. There is no left ventricular hypertrophy. Indeterminate diastolic filling due to E-A fusion. Right Ventricle: The right ventricular size is normal. No increase in right ventricular wall thickness. Right ventricular systolic function is mildly reduced. Tricuspid regurgitation signal is inadequate for assessing PA pressure. Left Atrium: Left atrial size was mildly dilated. Right Atrium: Right atrial size was normal in size. Pericardium: Trivial pericardial effusion is present. Mitral Valve: The mitral valve is grossly normal. Mild to moderate mitral valve regurgitation. No evidence of mitral valve stenosis. Tricuspid Valve: The tricuspid valve is grossly normal. Tricuspid valve regurgitation is trivial. No evidence of tricuspid stenosis. Aortic Valve: The aortic valve is tricuspid. Aortic valve regurgitation is trivial. No aortic stenosis is present. Pulmonic Valve: The pulmonic valve was grossly normal. Pulmonic valve regurgitation is trivial. No evidence of  pulmonic stenosis. Aorta: The aortic root, ascending aorta and aortic arch are all structurally normal, with no evidence of dilitation or obstruction. Venous: The inferior vena cava is normal in size with greater than 50% respiratory variability, suggesting right atrial pressure of 3 mmHg. IAS/Shunts: Cannot exclude a small PFO.  LEFT VENTRICLE PLAX 2D LVIDd:         6.00 cm      Diastology LVIDs:         5.50 cm      LV e' medial:   5.56 cm/s LV PW:         0.80 cm      LV E/e' medial: 19.3 LV IVS:        0.70 cm LVOT diam:     2.00 cm LV SV:         28 LV SV Index:   15 LVOT Area:     3.14 cm  LV Volumes (MOD) LV vol d, MOD A2C: 197.0 ml LV vol d, MOD A4C: 232.0 ml LV vol s, MOD A2C: 152.0 ml LV vol s, MOD A4C: 190.0 ml LV SV MOD A2C:     45.0 ml LV SV MOD A4C:     232.0 ml LV SV MOD  BP:      45.1 ml RIGHT VENTRICLE RV S prime:     10.90 cm/s TAPSE (M-mode): 1.7 cm LEFT ATRIUM             Index        RIGHT ATRIUM          Index LA diam:        4.10 cm 2.19 cm/m   RA Area:     8.36 cm LA Vol (A2C):   51.3 ml 27.43 ml/m  RA Volume:   14.30 ml 7.65 ml/m LA Vol (A4C):   78.9 ml 42.18 ml/m LA Biplane Vol: 65.0 ml 34.75 ml/m  AORTIC VALVE             PULMONIC VALVE LVOT Vmax:   56.40 cm/s  PR End Diast Vel: 10.50 msec LVOT Vmean:  39.900 cm/s RVOT Peak grad:   1 mmHg LVOT VTI:    0.089 m  AORTA Ao Root diam: 2.80 cm MITRAL VALVE MV Area (PHT): 3.95 cm     SHUNTS MV Decel Time: 192 msec     Systemic VTI:  0.09 m MV E velocity: 107.33 cm/s  Systemic Diam: 2.00 cm                             Pulmonic VTI:  0.085 m Cherlynn Kaiser MD Electronically signed by Cherlynn Kaiser MD Signature Date/Time: 09/17/2022/10:25:04 AM    Final    CT Angio Chest/Abd/Pel for Dissection W and/or W/WO  Result Date: 09/17/2022 CLINICAL DATA:  Aortic aneurysm, known or suspected. Shortness of breath, chest pain EXAM: CT ANGIOGRAPHY CHEST, ABDOMEN AND PELVIS TECHNIQUE: Non-contrast CT of the chest was initially obtained. Multidetector CT imaging through the chest, abdomen and pelvis was performed using the standard protocol during bolus administration of intravenous contrast. Multiplanar reconstructed images and MIPs were obtained and reviewed to evaluate the vascular anatomy. RADIATION DOSE REDUCTION: This exam was performed according to the departmental dose-optimization program which includes automated exposure control, adjustment of the mA and/or kV according to patient size and/or use of iterative reconstruction technique. CONTRAST:  131m OMNIPAQUE IOHEXOL 350 MG/ML SOLN COMPARISON:  01/05/2021 CT abdomen and pelvis.  02/06/2020 CT angio chest. FINDINGS: CTA CHEST FINDINGS Cardiovascular: Heart is normal size. Aorta is normal caliber. No evidence of aortic dissection. No pulmonary embolus. Mediastinum/Nodes:  Mildly prominent mediastinal lymph nodes. Right paratracheal lymph node has a short axis diameter of 12 mm. Prevascular lymph nodes have a short axis diameter of 8 mm. These are stable since 2021 chest CT. No axillary or hilar adenopathy. Lungs/Pleura: Moderate bilateral pleural effusions. Ground-glass airspace opacities within the lungs. More confluent bibasilar opacities. Musculoskeletal: Chest wall soft tissues are unremarkable. No acute bony abnormality. Review of the MIP images confirms the above findings. CTA ABDOMEN AND PELVIS FINDINGS VASCULAR Aorta: Mild atherosclerotic irregularity. No aneurysm or dissection. Celiac: Patent without evidence of aneurysm, dissection, vasculitis or significant stenosis. SMA: Patent without evidence of aneurysm, dissection, vasculitis or significant stenosis. Renals: Both renal arteries are patent without evidence of aneurysm, dissection, vasculitis, fibromuscular dysplasia or significant stenosis. IMA: Patent without evidence of aneurysm, dissection, vasculitis or significant stenosis. Inflow: Atherosclerotic calcifications and irregularity. No aneurysm or dissection. Veins: No obvious venous abnormality within the limitations of this arterial phase study. Review of the MIP images confirms the above findings. NON-VASCULAR Hepatobiliary: No focal liver abnormality is seen. Status post cholecystectomy. No biliary dilatation. Pancreas: No focal abnormality or ductal dilatation. Spleen: No focal abnormality.  Normal size. Adrenals/Urinary Tract: No adrenal abnormality. No focal renal abnormality. No stones or hydronephrosis. Urinary bladder is unremarkable. Stomach/Bowel: Stomach, large and small bowel grossly unremarkable. Lymphatic: No adenopathy Reproductive: Uterus and adnexa unremarkable. No mass. IUD in the uterus. Other: No free fluid or free air. Musculoskeletal: No acute bony abnormality. Review of the MIP images confirms the above findings. IMPRESSION: No evidence of  aortic aneurysm or dissection. Moderate atherosclerosis in the infrarenal abdominal aorta and iliac vessels. No evidence of pulmonary embolus. Moderate bilateral pleural effusions. Bilateral ground-glass airspace opacities and more confluent opacities in the lower lobes. This is concerning for pneumonia. No acute findings in the abdomen or pelvis. Electronically Signed   By: Rolm Baptise M.D.   On: 09/17/2022 01:04   DG Chest 2 View  Result Date: 09/16/2022 CLINICAL DATA:  Chest pain EXAM: CHEST - 2 VIEW COMPARISON:  01/05/2021 FINDINGS: Lung volumes are small and pulmonary insufflation has decreased in the interval since prior examination. There has developed small bilateral pleural effusions and diffuse interstitial pulmonary infiltrate in keeping with moderate cardiogenic failure. No pneumothorax. Cardiac size is within normal limits. Coronary artery stenting has been performed. No acute bone abnormality. IMPRESSION: 1. Moderate cardiogenic failure with small bilateral pleural effusions. 2. Pulmonary hypoinflation. Electronically Signed   By: Fidela Salisbury M.D.   On: 09/16/2022 22:49     Medications:     Current Medications:  [MAR Hold] aspirin EC  81 mg Oral Daily   [MAR Hold] carvedilol  3.125 mg Oral BID WC   clopidogrel  75 mg Oral Daily   [MAR Hold] doxycycline  100 mg Oral BID   [MAR Hold] escitalopram  10 mg Oral Daily   [MAR Hold] ezetimibe  10 mg Oral Daily   furosemide  40 mg Intravenous BID   [MAR Hold] insulin glargine-yfgn  8 Units Subcutaneous QHS   losartan  25 mg Oral Daily   sodium chloride flush  3 mL Intravenous Q12H   [MAR Hold] spironolactone  12.5 mg Oral Daily    Infusions:  sodium chloride     sodium chloride 10 mL/hr at 09/17/22 9417      Patient Profile   50 y.o.  woman with COPD, tobacco abuse, premature CAD s/p anterior MI, poorly controlled DM2 in setting of poor compliance w/ diabetes regimen, statin intolerant, chronic back pain, and systolic HF due  to iCM. EF ultimately improved after LAD PCI. Now admitted w/ NSTEMI due to occluded LAD stent and new CTO of RCA w/ L>>R collaterals, previously placed LCx stent widely patent. LVEF back down, 20%, RV mildly reduced. RHC w/ elevated R + L Filling pressures and low output.   Assessment/Plan   1. CAD/ NSTEMI  - prior anterior MI w/ h/o PCI to mLAD and mLCX - HS trop peak 2,501 - Echo w/ drop in EF, from 55% >> 20%, RV mildly reduced - Cath w/ 90% in-stent restenosis of mLAD and CTO mRCA with left-to-right collaterals. LCx stent widely patent. PCI deferred. Medical management for now until better optimized from HF standpoint. Ideally, would recommend CABG however not ideal candidate w/ poorly controlled T2DM w/ Hgb A1c 13. Will discuss w/ CT surgery and IC team regarding medical management>>bridge to CABG once better glycemic control vs PCI of occluded LAD stent.  - IV heparin + ASA/ Plavix  - statin intolerant, on Repatha PTA - hold ? blocker w/ low output   2. Acute Systolic Heart Failure - h/o ICM following prior anterior MI, EF previously 25-30%, improved post LAD and LCx PCI - Echo 9/21 EF 60-65% - Echo 04/06/21 EF 50-55% mild anterior and apical HK - Echo this admit, EF down 20%, RV mildly reduced in setting of LAD and RCA infarcts, previously placed mLAD stent w/ 99% stenosis, CTO mRCA w/ L>R collaterals  - RHC w/ elevated R+ L filling pressures and low output, RA 23, PCWP 30, CI 2.0  - diurese w/ IV Lasix 40 mg bid  - on lisinopril PTA, bridge w/ Losartan w/ eventual transition to Entersto in 48 hr - restart home spiro 12.5 mg daily  - hold ? blocker w/ low output - add digoxin 0.125 mg  - no SGLT2i w/ poorly controlled DM  - plan eventual PCI vs CABG per above  3. Type 2DM - poorly controlled, in setting of poor compliance w/ regimen - previous Hgb A1c 13.3 (12/22) - repeat A1c pending    4. HLD w/ LDL Goal < 55 - statin intolerant - on Repatha PTA + Zetia, reports compliance    - LDL 86 on admit  - needs f/u in lipid clinic, may need trial of very low dose statin as adjunct   5. H/o Tobacco Use - quit cigarettes, currently vapes   Length of Stay: 0  Brittainy Simmons, PA-C  09/17/2022, 3:04 PM  Advanced Heart Failure Team Pager [phone] (M-F; 7a - 5p)  Please contact [facility] for night-coverage after hours (4p -7a ) and weekends on [email or web address]  Patient seen and examined with the above-signed Advanced Practice Provider and/or Housestaff. I personally reviewed laboratory data, imaging studies and relevant notes. I independently examined the patient and formulated the important aspects of the plan. I have edited the note to reflect any of my changes or salient points. I have personally discussed the plan with the patient and/or family.  51 y/o woman with poorly-controlled DM2, CAD with previous LAD stent, h/o iCM with recovered EF, ongoing tobacco use (vaping). Admitted with NSTEMI.  Cath with CTO RCA and high-grade ISR of LAD stent. EF 20-25% with markedly elevated filling pressures and CI 1.97.   Post-cath denies CP or SOB .  General:  Lying flat No resp difficulty HEENT:  normal Neck: supple. JVP 8-9  Carotids 2+ bilat; no bruits. No lymphadenopathy or thryomegaly appreciated. Cor: PMI nondisplaced. Regular rate & rhythm. No rubs, gallops or murmurs. Lungs: clear Abdomen: soft, nontender, nondistended. No hepatosplenomegaly. No bruits or masses. Good bowel sounds. Extremities: no cyanosis, clubbing, rash, edema Neuro: alert & orientedx3, cranial nerves grossly intact. moves all 4 extremities w/o difficulty. Affect pleasant  Difficult situation. Ideally would be best served with CABG (LIMA to LAD, SVG to RCA) but given uncontrolled DM2 will need a Heart Team approach to further evaluate. Will diurese as tolerated. Optimize meds. REcheck A1c.   Glori Bickers, MD  5:57 PM

## 2022-09-17 NOTE — Inpatient Diabetes Management (Signed)
Inpatient Diabetes Program Recommendations  AACE/ADA: New Consensus Statement on Inpatient Glycemic Control  Target Ranges:  Prepandial:   less than 140 mg/dL      Peak postprandial:   less than 180 mg/dL (1-2 hours)      Critically ill patients:  140 - 180 mg/dL    Latest Reference Range & Units 09/16/22 22:15 09/16/22 22:36 09/17/22 06:59  Glucose 70 - 99 mg/dL 370 (H) 374 (H) 269 (H)   Review of Glycemic Control  Diabetes history: DM2 Outpatient Diabetes medications: Lantus 8 units daily, Jardiance 10 mg daily, Metformin 1000 mg BID Current orders for Inpatient glycemic control: Semglee 8 units QHS  Inpatient Diabetes Program Recommendations:    Insulin: Please consider ordering CBGs and Novolog 0-9 units Q4H.  Thanks, Barnie Alderman, RN, MSN, [place] Diabetes Coordinator Inpatient Diabetes Program [phone] (Team Pager from 8am to [place])

## 2022-09-17 NOTE — Progress Notes (Signed)
ANTICOAGULATION CONSULT NOTE - Initial Consult  Pharmacy Consult for heparin Indication: chest pain/ACS  Allergies  Allergen Reactions   Esomeprazole Magnesium Anaphylaxis   Ciprofibrate Itching   Ciprofloxacin Itching   Gabapentin Other (See Comments)    Achy - myalgia   Lubiprostone Diarrhea   Statins     Myalgia - flu like symptoms   Amlodipine Rash   Iron Rash and Swelling   Latex Rash and Swelling   Povidone Iodine Rash    Patient Measurements: Height: 5\' 2"  (157.5 cm) Weight: 86.2 kg (190 lb) IBW/kg (Calculated) : 50.1 Heparin Dosing Weight: 70kg  Vital Signs: Temp: 98.9 F (37.2 C) (10/30 0657) Temp Source: Oral (10/30 0657) BP: 104/71 (10/30 0900) Pulse Rate: 99 (10/30 0900)  Labs: Recent Labs    09/16/22 2215 09/16/22 2236 09/17/22 0150 09/17/22 0659  HGB 13.4 14.6  --  12.1  HCT 43.2 43.0  --  39.6  PLT 291  --   --  264  HEPARINUNFRC  --   --   --  0.24*  CREATININE 1.17* 1.00  --  0.98  TROPONINIHS 1,954*  --  2,501*  --      Estimated Creatinine Clearance: 68.4 mL/min (by C-G formula based on SCr of 0.98 mg/dL).   Medical History: Past Medical History:  Diagnosis Date   CHF (congestive heart failure) ([place])    Coronary artery disease    COVID 12/03/2020   Diabetes mellitus without complication (HCC)    Hypertension     Assessment: 51yo female c/o CP associated w/ SOB and diaphoresis, troponin found to be elevated. Pt was not taking any anticoagulation prior to admission. Pharmacy consulted to dose and manage heparin infusion per ACS protocol.  Hgb down to 12.1, Plt down to 264 Heparin level = 0.24 (4-hr level) Current heparin infusion rate: 1000 units/hr  Goal of Therapy:  Heparin level 0.3-0.7 units/ml Monitor platelets by anticoagulation protocol: Yes   Plan:  Increase heparin infusion rate to 1150 units/hr Check heparin level in 6 hours Monitor daily CBC, heparin level, and for s/sx of bleeding.  Luisa Hart, [name],  BCPS Clinical Pharmacist 09/17/2022 9:35 AM   Please refer to [facility] for pharmacy phone number

## 2022-09-17 NOTE — Progress Notes (Signed)
ANTICOAGULATION CONSULT NOTE - Initial Consult  Pharmacy Consult for heparin Indication: chest pain/ACS  Allergies  Allergen Reactions   Esomeprazole Magnesium Anaphylaxis    unkn   Atorvastatin Other (See Comments)    Flu like symptoms   Ciprofibrate Itching   Ciprofloxacin Itching   Gabapentin     Achy   Lubiprostone Diarrhea   Statins     Flu like symptoms   Amlodipine Rash   Latex Rash and Swelling    Patient Measurements: Height: 5\' 2"  (157.5 cm) Weight: 86.2 kg (190 lb) IBW/kg (Calculated) : 50.1 Heparin Dosing Weight: 70kg  Vital Signs: Temp: 98 F (36.7 C) (10/30 0152) Temp Source: Oral (10/30 0152) BP: 106/84 (10/30 0155) Pulse Rate: 114 (10/30 0155)  Labs: Recent Labs    09/16/22 2215 09/16/22 2236  HGB 13.4 14.6  HCT 43.2 43.0  PLT 291  --   CREATININE 1.17* 1.00  TROPONINIHS 1,954*  --     Estimated Creatinine Clearance: 67 mL/min (by C-G formula based on SCr of 1 mg/dL).   Medical History: Past Medical History:  Diagnosis Date   CHF (congestive heart failure) ([place])    Coronary artery disease    COVID 12/03/2020   Diabetes mellitus without complication (HCC)    Hypertension     Assessment: 51yo female c/o CP associated w/ SOB and diaphoresis, troponin found to be elevated >> to begin heparin.  Goal of Therapy:  Heparin level 0.3-0.7 units/ml Monitor platelets by anticoagulation protocol: Yes   Plan:  Heparin 4000 units IV bolus x1 followed by infusion at 1000 units/hr. Monitor heparin levels and CBC.  Wynona Neat, [name], BCPS  09/17/2022,2:10 AM

## 2022-09-17 NOTE — ED Provider Notes (Signed)
[facility] Emergency Department Provider Note MRN:  [id number]  Arrival date & time: 09/17/22     Chief Complaint   Chest Pain and Shortness of Breath   History of Present Illness   Julie Hernandez is a 51 y.o. year-old female presents to the ED with chief complaint of chest pain and SOB for the past 12 hours.  Has been worsening.  Exacerbated with exertion.  Hx of MI and stents.  Also reports recent cough and congestion.  Started on abx and steroids from urgent care.  History provided by patient.   Review of Systems  Pertinent positive and negative review of systems noted in HPI.    Physical Exam   Vitals:   09/17/22 0155 09/17/22 0215  BP: 106/84 (!) 145/106  Pulse: (!) 114 (!) 116  Resp: (!) 24 (!) 26  Temp:    SpO2: 95% 96%    CONSTITUTIONAL:  non toxic-appearing, NAD NEURO:  Alert and oriented x 3, CN 3-12 grossly intact EYES:  eyes equal and reactive ENT/NECK:  Supple, no stridor  CARDIO:  tachycardic, regular rhythm, appears well-perfused  PULM:  No respiratory distress, lower lobe rales GI/GU:  non-distended,  MSK/SPINE:  No gross deformities, no edema, moves all extremities  SKIN:  no rash, atraumatic   *Additional and/or pertinent findings included in MDM below  Diagnostic and Interventional Summary    EKG Interpretation  Date/Time:  Sunday September 16 2022 22:10:21 EDT Ventricular Rate:  123 PR Interval:  146 QRS Duration: 104 QT Interval:  328 QTC Calculation: 469 R Axis:   65 Text Interpretation: Sinus tachycardia with occasional Premature ventricular complexes Low voltage QRS Marked ST abnormality, possible inferior subendocardial injury Abnormal ECG When compared with ECG of 06-Apr-2021 10:39, PREVIOUS ECG IS PRESENT Confirmed by Merrily Pew [id number]) on 09/17/2022 1:24:51 AM       Labs Reviewed  BASIC METABOLIC PANEL - Abnormal; Notable for the following components:      Result Value   CO2 20 (*)    Glucose, Bld  370 (*)    Creatinine, Ser 1.17 (*)    GFR, Estimated 56 (*)    All other components within normal limits  I-STAT CHEM 8, ED - Abnormal; Notable for the following components:   Chloride 112 (*)    BUN 21 (*)    Glucose, Bld 374 (*)    TCO2 21 (*)    All other components within normal limits  TROPONIN I (HIGH SENSITIVITY) - Abnormal; Notable for the following components:   Troponin I (High Sensitivity) 1,954 (*)    All other components within normal limits  CBC  HEPARIN LEVEL (UNFRACTIONATED)  TROPONIN I (HIGH SENSITIVITY)    CT Angio Chest/Abd/Pel for Dissection W and/or W/WO  Final Result    DG Chest 2 View  Final Result      Medications  nitroGLYCERIN 50 mg in dextrose 5 % 250 mL (0.2 mg/mL) infusion (has no administration in time range)  heparin bolus via infusion 4,000 Units (has no administration in time range)  heparin ADULT infusion 100 units/mL (25000 units/229mL) (has no administration in time range)  iohexol (OMNIPAQUE) 350 MG/ML injection 100 mL (100 mLs Intravenous Contrast Given 09/17/22 0052)     Procedures  /  Critical Care .Critical Care  Performed by: Montine Circle, PA-C Authorized by: Montine Circle, PA-C   Critical care provider statement:    Critical care time (minutes):  48   Critical care was necessary to treat or  prevent imminent or life-threatening deterioration of the following conditions:  Circulatory failure   Critical care was time spent personally by me on the following activities:  Development of treatment plan with patient or surrogate, discussions with consultants, evaluation of patient's response to treatment, examination of patient, ordering and review of laboratory studies, ordering and review of radiographic studies, ordering and performing treatments and interventions, pulse oximetry, re-evaluation of patient's condition and review of old charts   ED Course and Medical Decision Making  I have reviewed the triage vital signs, the  nursing notes, and pertinent available records from the EMR.  Social Determinants Affecting Complexity of Care: Patient has no clinically significant social determinants affecting this chief complaint..   ED Course:    Medical Decision Making Patient here with chest pain that is worsened with exertion.  Troponin ordered in triage is elevated at [place].  Patient brought back to her room.  Started on heparin and I have also ordered nitro infusion.  I spoke with cardiology fellow, who will come to see the patient.  Plan for admission for NSTEMI.  Consider treatment for pulmonary opacity which could be pneumonia.  Problems Addressed: NSTEMI (non-ST elevated myocardial infarction) [facility]): acute illness or injury  Amount and/or Complexity of Data Reviewed Labs: ordered.    Details: Elevated trop at [place], concerning for NSTEMI. Radiology: ordered and independent interpretation performed.    Details: Some opacities seen in lungs on chest CT ECG/medicine tests: ordered and independent interpretation performed.    Details: Ischemic changes  Risk Prescription drug management. Decision regarding hospitalization.     Consultants: I discussed the case with Dr. Kalman Shan, cardiology, who will come to see patient.  Appreciate assistance.   Treatment and Plan: Patient's exam and diagnostic results are concerning for ACS.  Feel that patient will need admission to the hospital for further treatment and evaluation.  Patient seen by and discussed with attending physician, Dr. Dayna Barker, who agrees with plan for admission.  Final Clinical Impressions(s) / ED Diagnoses     ICD-10-CM   1. NSTEMI (non-ST elevated myocardial infarction) [facility])  I21.4       ED Discharge Orders     None         Discharge Instructions Discussed with and Provided to Patient:   Discharge Instructions   None      Montine Circle, PA-C 09/17/22 [id number]    Merrily Pew, MD 09/17/22 0425

## 2022-09-17 NOTE — TOC Benefit Eligibility Note (Signed)
Patient Teacher, English as a foreign language completed.    The patient is currently admitted and upon discharge could be taking Farxiga 10 mg.  The current 30 day co-pay is $45.00.   The patient is currently admitted and upon discharge could be taking Jardiance 10mg .  The current 30 day co-pay is $45.00.   The patient is currently admitted and upon discharge could be taking Entresto 49-51.  The current 30 day co-pay is $45.00.   The patient is insured through [employer].     Joneen Boers, [place] Patient Advocate Specialist [place] Patient Advocate Team Direct Number: [phone] Fax: [phone]

## 2022-09-18 ENCOUNTER — Encounter (HOSPITAL_COMMUNITY): Payer: Self-pay | Admitting: Internal Medicine

## 2022-09-18 ENCOUNTER — Encounter (HOSPITAL_COMMUNITY)
Admission: EM | Disposition: A | Discharge status: Home / Self Care | Payer: Self-pay | Source: Home / Self Care | Attending: Internal Medicine

## 2022-09-18 DIAGNOSIS — I214 Non-ST elevation (NSTEMI) myocardial infarction: Secondary | ICD-10-CM | POA: Diagnosis not present

## 2022-09-18 DIAGNOSIS — I2510 Atherosclerotic heart disease of native coronary artery without angina pectoris: Secondary | ICD-10-CM | POA: Diagnosis not present

## 2022-09-18 HISTORY — PX: CORONARY STENT INTERVENTION: CATH118234

## 2022-09-18 LAB — CBC
HCT: 37.5 % (ref 36.0–46.0)
Hemoglobin: 11.7 g/dL — ABNORMAL LOW (ref 12.0–15.0)
MCH: 28.3 pg (ref 26.0–34.0)
MCHC: 31.2 g/dL (ref 30.0–36.0)
MCV: 90.6 fL (ref 80.0–100.0)
Platelets: 269 10*3/uL (ref 150–400)
RBC: 4.14 MIL/uL (ref 3.87–5.11)
RDW: 14 % (ref 11.5–15.5)
WBC: 6.6 10*3/uL (ref 4.0–10.5)
nRBC: 0 % (ref 0.0–0.2)

## 2022-09-18 LAB — POCT ACTIVATED CLOTTING TIME
Activated Clotting Time: 239 seconds
Activated Clotting Time: 263 seconds

## 2022-09-18 LAB — GLUCOSE, CAPILLARY
Glucose-Capillary: 217 mg/dL — ABNORMAL HIGH (ref 70–99)
Glucose-Capillary: 292 mg/dL — ABNORMAL HIGH (ref 70–99)

## 2022-09-18 LAB — BASIC METABOLIC PANEL
Anion gap: 11 (ref 5–15)
BUN: 16 mg/dL (ref 6–20)
CO2: 25 mmol/L (ref 22–32)
Calcium: 9 mg/dL (ref 8.9–10.3)
Chloride: 104 mmol/L (ref 98–111)
Creatinine, Ser: 1.09 mg/dL — ABNORMAL HIGH (ref 0.44–1.00)
GFR, Estimated: >60 mL/min (ref >60)
Glucose, Bld: 318 mg/dL — ABNORMAL HIGH (ref 70–99)
Potassium: 3.9 mmol/L (ref 3.5–5.1)
Sodium: 140 mmol/L (ref 135–145)

## 2022-09-18 LAB — HEMOGLOBIN A1C
Hgb A1c MFr Bld: 13.1 % — ABNORMAL HIGH (ref 4.8–5.6)
Mean Plasma Glucose: 329.27 mg/dL

## 2022-09-18 LAB — LIPOPROTEIN A (LPA): Lipoprotein (a): 10.5 nmol/L (ref <75.0)

## 2022-09-18 SURGERY — CORONARY STENT INTERVENTION
Anesthesia: LOCAL

## 2022-09-18 MED ORDER — HYDRALAZINE HCL 20 MG/ML IJ SOLN: 10.0000 mg | INTRAMUSCULAR | Status: AC | PRN

## 2022-09-18 MED ORDER — LIDOCAINE HCL (PF) 1 % IJ SOLN
INTRAMUSCULAR | Status: DC | PRN
  Administered 2022-09-18: 30 mL

## 2022-09-18 MED ORDER — SODIUM CHLORIDE 0.9% FLUSH: 3.0000 mL | INTRAVENOUS | Status: DC | PRN

## 2022-09-18 MED ORDER — HEPARIN (PORCINE) IN NACL 1000-0.9 UT/500ML-% IV SOLN
INTRAVENOUS | Status: AC
  Filled 2022-09-18: qty 1000

## 2022-09-18 MED ORDER — SODIUM CHLORIDE 0.9 % IV SOLN: 250.0000 mL | INTRAVENOUS | Status: DC | PRN

## 2022-09-18 MED ORDER — HEPARIN SODIUM (PORCINE) 1000 UNIT/ML IJ SOLN
INTRAMUSCULAR | Status: DC | PRN
  Administered 2022-09-18: 3000 [IU] via INTRAVENOUS
  Administered 2022-09-18: 7000 [IU] via INTRAVENOUS

## 2022-09-18 MED ORDER — INSULIN ASPART 100 UNIT/ML IJ SOLN
0.0000 [IU] | Freq: Every day | INTRAMUSCULAR | Status: DC
  Administered 2022-09-18: 3 [IU] via SUBCUTANEOUS
  Administered 2022-09-19: 2 [IU] via SUBCUTANEOUS

## 2022-09-18 MED ORDER — SODIUM CHLORIDE 0.9 % IV SOLN: INTRAVENOUS | Status: DC

## 2022-09-18 MED ORDER — SODIUM CHLORIDE 0.9% FLUSH
3.0000 mL | Freq: Two times a day (BID) | INTRAVENOUS | Status: DC
  Administered 2022-09-18 – 2022-09-19 (×3): 3 mL via INTRAVENOUS

## 2022-09-18 MED ORDER — ONDANSETRON HCL 4 MG/2ML IJ SOLN: 4.0000 mg | Freq: Four times a day (QID) | INTRAMUSCULAR | Status: DC | PRN

## 2022-09-18 MED ORDER — SODIUM CHLORIDE 0.9 % IV SOLN: INTRAVENOUS | Status: AC

## 2022-09-18 MED ORDER — LIDOCAINE HCL (PF) 1 % IJ SOLN
INTRAMUSCULAR | Status: AC
  Filled 2022-09-18: qty 30

## 2022-09-18 MED ORDER — MIDAZOLAM HCL 2 MG/2ML IJ SOLN
INTRAMUSCULAR | Status: AC
  Filled 2022-09-18: qty 2

## 2022-09-18 MED ORDER — NITROGLYCERIN 1 MG/10 ML FOR IR/CATH LAB
INTRA_ARTERIAL | Status: DC | PRN
  Administered 2022-09-18 (×2): 100 ug via INTRACORONARY

## 2022-09-18 MED ORDER — FENTANYL CITRATE (PF) 100 MCG/2ML IJ SOLN
INTRAMUSCULAR | Status: DC | PRN
  Administered 2022-09-18 (×2): 25 ug via INTRAVENOUS

## 2022-09-18 MED ORDER — DIGOXIN 125 MCG PO TABS
0.1250 mg | ORAL_TABLET | Freq: Every day | ORAL | Status: DC
  Administered 2022-09-18 – 2022-09-19 (×2): 0.125 mg via ORAL
  Filled 2022-09-18 (×2): qty 1

## 2022-09-18 MED ORDER — SODIUM CHLORIDE 0.9% FLUSH
3.0000 mL | Freq: Two times a day (BID) | INTRAVENOUS | Status: DC
  Administered 2022-09-18: 3 mL via INTRAVENOUS

## 2022-09-18 MED ORDER — NITROGLYCERIN 1 MG/10 ML FOR IR/CATH LAB
INTRA_ARTERIAL | Status: AC
  Filled 2022-09-18: qty 10

## 2022-09-18 MED ORDER — INSULIN ASPART 100 UNIT/ML IJ SOLN
0.0000 [IU] | Freq: Three times a day (TID) | INTRAMUSCULAR | Status: DC
  Administered 2022-09-18: 5 [IU] via SUBCUTANEOUS
  Administered 2022-09-19: 8 [IU] via SUBCUTANEOUS
  Administered 2022-09-19 (×2): 3 [IU] via SUBCUTANEOUS
  Administered 2022-09-20: 8 [IU] via SUBCUTANEOUS
  Administered 2022-09-20: 3 [IU] via SUBCUTANEOUS

## 2022-09-18 MED ORDER — LABETALOL HCL 5 MG/ML IV SOLN: 10.0000 mg | INTRAVENOUS | Status: AC | PRN

## 2022-09-18 MED ORDER — FENTANYL CITRATE (PF) 100 MCG/2ML IJ SOLN
INTRAMUSCULAR | Status: AC
  Filled 2022-09-18: qty 2

## 2022-09-18 MED ORDER — HEPARIN SODIUM (PORCINE) 1000 UNIT/ML IJ SOLN
INTRAMUSCULAR | Status: AC
  Filled 2022-09-18: qty 10

## 2022-09-18 MED ORDER — MIDAZOLAM HCL 2 MG/2ML IJ SOLN
INTRAMUSCULAR | Status: DC | PRN
  Administered 2022-09-18: 2 mg via INTRAVENOUS
  Administered 2022-09-18: 1 mg via INTRAVENOUS

## 2022-09-18 MED ORDER — IOHEXOL 350 MG/ML SOLN
INTRAVENOUS | Status: DC | PRN
  Administered 2022-09-18: 40 mL

## 2022-09-18 MED ORDER — HEPARIN (PORCINE) IN NACL 1000-0.9 UT/500ML-% IV SOLN
INTRAVENOUS | Status: DC | PRN
  Administered 2022-09-18 (×2): 500 mL

## 2022-09-18 MED FILL — Heparin Sod (Porcine)-NaCl IV Soln 1000 Unit/500ML-0.9%: INTRAVENOUS | Qty: 1000 | Status: AC

## 2022-09-18 MED FILL — Lidocaine HCl Local Preservative Free (PF) Inj 1%: INTRAMUSCULAR | Qty: 30 | Status: AC

## 2022-09-18 SURGICAL SUPPLY — 18 items
BALL SAPPHIRE NC24 2.25X15 (BALLOONS) ×1
BALLN SAPPHIRE 2.0X12 (BALLOONS) ×1
BALLOON SAPPHIRE 2.0X12 (BALLOONS) IMPLANT
BALLOON SAPPHIRE NC24 2.25X15 (BALLOONS) IMPLANT
CATH LAUNCHER 6FR EBU3.5 (CATHETERS) IMPLANT
CLOSURE PERCLOSE PROSTYLE (VASCULAR PRODUCTS) IMPLANT
KIT ENCORE 26 ADVANTAGE (KITS) IMPLANT
KIT HEART LEFT (KITS) ×1 IMPLANT
KIT HEMO VALVE WATCHDOG (MISCELLANEOUS) IMPLANT
KIT MICROPUNCTURE NIT STIFF (SHEATH) IMPLANT
PACK CARDIAC CATHETERIZATION (CUSTOM PROCEDURE TRAY) ×1 IMPLANT
SHEATH PINNACLE 6F 10CM (SHEATH) IMPLANT
SHEATH PROBE COVER 6X72 (BAG) IMPLANT
STENT ONYX FRONTIER 2.0X18 (Permanent Stent) IMPLANT
TRANSDUCER W/STOPCOCK (MISCELLANEOUS) ×1 IMPLANT
TUBING CIL FLEX 10 FLL-RA (TUBING) ×1 IMPLANT
WIRE COUGAR XT STRL 190CM (WIRE) IMPLANT
WIRE EMERALD 3MM-J .035X150CM (WIRE) IMPLANT

## 2022-09-18 NOTE — Plan of Care (Signed)
  Problem: Cardiovascular: Goal: Vascular access site(s) Level 0-1 will be maintained Outcome: Progressing   Problem: Clinical Measurements: Goal: Respiratory complications will improve Outcome: Progressing Goal: Cardiovascular complication will be avoided Outcome: Progressing   Problem: Activity: Goal: Risk for activity intolerance will decrease Outcome: Progressing   Problem: Nutrition: Goal: Adequate nutrition will be maintained Outcome: Progressing   Problem: Coping: Goal: Level of anxiety will decrease Outcome: Progressing   

## 2022-09-18 NOTE — Progress Notes (Addendum)
Advanced Heart Failure Rounding Note  PCP-Cardiologist: Glori Bickers, MD   Subjective:   LHC yesterday: Cath w/ 90% in-stent restenosis of mLAD and CTO mRCA with left-to-right collaterals. LCx stent widely patent. Medical management for now. Considering CABG but needs better DM control. A1c 13.1.  RHC: elevated R+ L filling pressures and low output, RA 23, PCWP 30, CI 2.0   CP 5/10, not worst since yesterday. Stable. SOB with activity and long conversations. Stable at rest.   Diuresed w/ 40mg  IV lasix yesterday, pressures elevated on cath. Weight up, -1.7 l UOP  Objective:   Weight Range: 87.7 kg Body mass index is 35.36 kg/m.   Vital Signs:   Temp:  [97.9 F (36.6 C)-98.6 F (37 C)] 98.6 F (37 C) (10/31 0713) Pulse Rate:  [0-103] 76 (10/31 0713) Resp:  [12-22] 14 (10/31 0713) BP: (90-126)/(56-96) 90/60 (10/31 0713) SpO2:  [80 %-97 %] 92 % (10/31 0713) Weight:  [87.7 kg] 87.7 kg (10/31 0424)   Weight change: Filed Weights   09/17/22 0200 09/18/22 0424  Weight: 86.2 kg 87.7 kg    Intake/Output:   Intake/Output Summary (Last 24 hours) at 09/18/2022 0819 Last data filed at 09/18/2022 0429 Gross per 24 hour  Intake 357.74 ml  Output 1700 ml  Net -1342.26 ml     Physical Exam    General:  well appearing.  No respiratory difficulty HEENT: normal Neck: supple. JVD ~8 cm. Carotids 2+ bilat; no bruits. No lymphadenopathy or thyromegaly appreciated. Cor: PMI nondisplaced. Regular rate & rhythm. No rubs, gallops or murmurs. Lungs: clear Abdomen: soft, nontender, nondistended. No hepatosplenomegaly. No bruits or masses. Good bowel sounds. Extremities: no cyanosis, clubbing, rash, edema  Neuro: alert & oriented x 3, cranial nerves grossly intact. moves all 4 extremities w/o difficulty. Affect pleasant.   Telemetry   NSR 80s 2-4 PVCs (Personally reviewed)    EKG    Normal sinus rhythm 76 bpm Anterior infarct , age undetermined  Labs    CBC Recent Labs     09/17/22 0659 09/17/22 1157 09/17/22 1158 09/18/22 0018  WBC 8.4  --   --  6.6  HGB 12.1   < > 12.2 11.7*  HCT 39.6   < > 36.0 37.5  MCV 91.0  --   --  90.6  PLT 264  --   --  269   < > = values in this interval not displayed.   Basic Metabolic Panel Recent Labs    09/17/22 0659 09/17/22 1157 09/17/22 1158 09/18/22 0018  NA 142   < > 140 140  K 3.5   < > 3.3* 3.9  CL 111  --   --  104  CO2 19*  --   --  25  GLUCOSE 269*  --   --  318*  BUN 16  --   --  16  CREATININE 0.98  --   --  1.09*  CALCIUM 8.7*  --   --  9.0   < > = values in this interval not displayed.   Liver Function Tests No results for input(s): "AST", "ALT", "ALKPHOS", "BILITOT", "PROT", "ALBUMIN" in the last 72 hours. No results for input(s): "LIPASE", "AMYLASE" in the last 72 hours. Cardiac Enzymes No results for input(s): "CKTOTAL", "CKMB", "CKMBINDEX", "TROPONINI" in the last 72 hours.  BNP: BNP (last 3 results) No results for input(s): "BNP" in the last 8760 hours.  ProBNP (last 3 results) No results for input(s): "PROBNP" in the last 8760  hours.   D-Dimer No results for input(s): "DDIMER" in the last 72 hours. Hemoglobin A1C Recent Labs    09/18/22 0018  HGBA1C 13.1*   Fasting Lipid Panel Recent Labs    09/17/22 0659  CHOL 157  HDL 33*  LDLCALC 86  TRIG 188*  CHOLHDL 4.8   Thyroid Function Tests No results for input(s): "TSH", "T4TOTAL", "T3FREE", "THYROIDAB" in the last 72 hours.  Invalid input(s): "FREET3"  Other results:   Imaging    CARDIAC CATHETERIZATION  Result Date: 09/17/2022 Conclusions: Severe two vessel coronary artery disease, including 90% in-stent restenosis of mid LAD and chronic total occlusion of mid RCA with left-to-right collaterals. Widely patent mid LCx stent. Severely elevated left and right heart filling pressures. Mildly reduced cardiac output/index. Occluded bilateral radial arteries. Recommendations: Angiograms and hemodynamics reviewed with  Dr. Smith.  We have agreed to defer PCI to LAD in-stent restenosis in favor of optimizing medical therapy.  Benefit of PCI to mid LAD is uncertain given prior cardiac MRI demonstrating mixture of viable and nonviable myocardium in the LAD territory on MRI in 2021. Escalate diuresus and goal directed medical therapy.  Lisinopril has been switched to losartan in the hope of transitioning to Entresto in the coming days. Aggressive secondary prevention of coronary artery disease. Christopher End, MD [facility]  ECHOCARDIOGRAM COMPLETE  Result Date: 09/17/2022    ECHOCARDIOGRAM REPORT   Patient Name:   Masa M Giovanetti Date of Exam: 09/17/2022 Medical Rec #:  [id number]      Height:       62.0 in Accession #:    [id number]     Weight:       190.0 lb Date of Birth:  06/16/1971     BSA:          1.870 m Patient Age:    51 years       BP:           104/77 mmHg Patient Gender: F              HR:           99 bpm. Exam Location:  Inpatient Procedure: 2D Echo, Cardiac Doppler, Color Doppler and Intracardiac            Opacification Agent STAT ECHO Indications:    CHF-Acute Systolic I50.21  History:        Patient has prior history of Echocardiogram examinations, most                 recent 04/06/2021. CHF, CAD and NSTEMI, Prior CABG,                 Arrythmias:non-specific ST changes; Risk Factors:Diabetes and                 Hypertension.  Sonographer:    Tiffany Cooper RDCS Referring Phys: [id number] HENRY W SMITH IMPRESSIONS  1. Left ventricular ejection fraction, by estimation, is 20%. The left ventricle has severely decreased function. The left ventricle demonstrates global hypokinesis. The left ventricular internal cavity size was moderately dilated. Indeterminate diastolic filling due to E-A fusion.  2. Right ventricular systolic function is mildly reduced. The right ventricular size is normal. Tricuspid regurgitation signal is inadequate for assessing PA pressure.  3. Left atrial size was mildly dilated.  4. The mitral  valve is grossly normal. Mild to moderate mitral valve regurgitation with some splay artifact suggesting eccentric component to jet. No evidence of mitral stenosis.  5. The aortic valve   is tricuspid. Aortic valve regurgitation is trivial. No aortic stenosis is present.  6. The inferior vena cava is normal in size with greater than 50% respiratory variability, suggesting right atrial pressure of 3 mmHg.  7. Cannot exclude a small PFO. Conclusion(s)/Recommendation(s): No left ventricular mural or apical thrombus/thrombi. FINDINGS  Left Ventricle: Left ventricular ejection fraction, by estimation, is 20%. The left ventricle has severely decreased function. The left ventricle demonstrates global hypokinesis. Definity contrast agent was given IV to delineate the left ventricular endocardial borders. The left ventricular internal cavity size was moderately dilated. There is no left ventricular hypertrophy. Indeterminate diastolic filling due to E-A fusion. Right Ventricle: The right ventricular size is normal. No increase in right ventricular wall thickness. Right ventricular systolic function is mildly reduced. Tricuspid regurgitation signal is inadequate for assessing PA pressure. Left Atrium: Left atrial size was mildly dilated. Right Atrium: Right atrial size was normal in size. Pericardium: Trivial pericardial effusion is present. Mitral Valve: The mitral valve is grossly normal. Mild to moderate mitral valve regurgitation. No evidence of mitral valve stenosis. Tricuspid Valve: The tricuspid valve is grossly normal. Tricuspid valve regurgitation is trivial. No evidence of tricuspid stenosis. Aortic Valve: The aortic valve is tricuspid. Aortic valve regurgitation is trivial. No aortic stenosis is present. Pulmonic Valve: The pulmonic valve was grossly normal. Pulmonic valve regurgitation is trivial. No evidence of pulmonic stenosis. Aorta: The aortic root, ascending aorta and aortic arch are all structurally normal,  with no evidence of dilitation or obstruction. Venous: The inferior vena cava is normal in size with greater than 50% respiratory variability, suggesting right atrial pressure of 3 mmHg. IAS/Shunts: Cannot exclude a small PFO.  LEFT VENTRICLE PLAX 2D LVIDd:         6.00 cm      Diastology LVIDs:         5.50 cm      LV e' medial:   5.56 cm/s LV PW:         0.80 cm      LV E/e' medial: 19.3 LV IVS:        0.70 cm LVOT diam:     2.00 cm LV SV:         28 LV SV Index:   15 LVOT Area:     3.14 cm  LV Volumes (MOD) LV vol d, MOD A2C: 197.0 ml LV vol d, MOD A4C: 232.0 ml LV vol s, MOD A2C: 152.0 ml LV vol s, MOD A4C: 190.0 ml LV SV MOD A2C:     45.0 ml LV SV MOD A4C:     232.0 ml LV SV MOD BP:      45.1 ml RIGHT VENTRICLE RV S prime:     10.90 cm/s TAPSE (M-mode): 1.7 cm LEFT ATRIUM             Index        RIGHT ATRIUM          Index LA diam:        4.10 cm 2.19 cm/m   RA Area:     8.36 cm LA Vol (A2C):   51.3 ml 27.43 ml/m  RA Volume:   14.30 ml 7.65 ml/m LA Vol (A4C):   78.9 ml 42.18 ml/m LA Biplane Vol: 65.0 ml 34.75 ml/m  AORTIC VALVE             PULMONIC VALVE LVOT Vmax:   56.40 cm/s  PR End Diast Vel: 10.50 msec LVOT Vmean:  39.900 cm/s  RVOT Peak grad:   1 mmHg LVOT VTI:    0.089 m  AORTA Ao Root diam: 2.80 cm MITRAL VALVE MV Area (PHT): 3.95 cm     SHUNTS MV Decel Time: 192 msec     Systemic VTI:  0.09 m MV E velocity: 107.33 cm/s  Systemic Diam: 2.00 cm                             Pulmonic VTI:  0.085 m Cherlynn Kaiser MD Electronically signed by Cherlynn Kaiser MD Signature Date/Time: 09/17/2022/10:25:04 AM    Final      Medications:     Scheduled Medications:  aspirin EC  81 mg Oral Daily   carvedilol  3.125 mg Oral BID WC   clopidogrel  75 mg Oral Daily   doxycycline  100 mg Oral BID   enoxaparin (LOVENOX) injection  40 mg Subcutaneous Q24H   escitalopram  10 mg Oral Daily   ezetimibe  10 mg Oral Daily   furosemide  40 mg Intravenous BID   insulin glargine-yfgn  8 Units Subcutaneous QHS    losartan  25 mg Oral Daily   sodium chloride flush  3 mL Intravenous Q12H   spironolactone  12.5 mg Oral Daily    Infusions:  sodium chloride Stopped (09/17/22 1504)   sodium chloride      PRN Medications: sodium chloride, acetaminophen, nitroGLYCERIN, ondansetron, sodium chloride flush    Patient Profile   51 y.o. 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 EF ultimately improved after LAD PCI. Now admitted w/ NSTEMI due to occluded LAD stent and new CTO of RCA w/ L>>R collaterals, previously placed LCx stent widely patent. LVEF back down, 20%, RV mildly reduced. RHC w/ elevated R + L Filling pressures and low output.   Assessment/Plan  1. CAD/ NSTEMI  - prior anterior MI w/ h/o PCI to mLAD and mLCX - HS trop peak 2,501 - Echo w/ drop in EF, from 55% >> 20%, RV mildly reduced - Cath yesterday w/ 90% in-stent restenosis of mLAD and CTO mRCA with left-to-right collaterals. LCx stent widely patent. PCI deferred. Medical management for now until better optimized from HF standpoint. Ideally, would recommend CABG however not ideal candidate w/ poorly controlled T2DM w/ Hgb A1c 13. Will discuss w/ CT surgery and IC team regarding medical management>>bridge to CABG once better glycemic control vs PCI of occluded LAD stent.  - IV heparin + ASA/ Plavix  - statin intolerant, on Repatha PTA - hold ? blocker w/ low output    2. Acute Systolic Heart Failure - h/o ICM following prior anterior MI, EF previously 25-30%, improved post LAD and LCx PCI - Echo 9/21 EF 60-65% - Echo 04/06/21 EF 50-55% mild anterior and apical HK - Echo this admit, EF down 20%, RV mildly reduced in setting of LAD and RCA infarcts, previously placed mLAD stent w/ 99% stenosis, CTO mRCA w/ L>R collaterals  - RHC w/ elevated R+ L filling pressures and low output, RA  23, PCWP 30, CI 2.0  - diurese w/ IV Lasix 40 mg bid, can continue through today, may transition to PO tomorrow - on lisinopril PTA, bridge w/ Losartan w/ eventual transition to Entresto in 48 hr. BP stable but on the lower side, may not tolerate Entresto - Continue  spiro 12.5 mg daily  - hold ? blocker w/ low output - add digoxin 0.125 mg  - no SGLT2i w/ poorly controlled DM  - plan eventual PCI vs CABG per above   3. Type 2DM - poorly controlled, in setting of poor compliance w/ regimen - previous Hgb A1c 13.3 (12/22) - A1c 10/31 13.1   4. HLD w/ LDL Goal < 55 - statin intolerant - on Repatha PTA + Zetia, reports compliance   - LDL 86 on admit  - needs f/u in lipid clinic, may need trial of very low dose statin as adjunct    5. H/o Tobacco Use - quit cigarettes, currently vapes   Length of Stay: [place] AGACNP-BC  09/18/2022, 8:19 AM  Advanced Heart Failure Team Pager [phone] (M-F; 7a - 5p)  Please contact [facility] for night-coverage after hours (5p -7a ) and weekends on [email or web address]   Patient seen and examined with the above-signed Advanced Practice Provider and/or Housestaff. I personally reviewed laboratory data, imaging studies and relevant notes. I independently examined the patient and formulated the important aspects of the plan. I have edited the note to reflect any of my changes or salient points. I have personally discussed the plan with the patient and/or family.  Feeling better with diuresis. No further CP, orthopnea or PND. Renal function stable.   General:  Well appearing. No resp difficulty HEENT: normal Neck: supple. no JVD. Carotids 2+ bilat; no bruits. No lymphadenopathy or thryomegaly appreciated. Cor: PMI nondisplaced. Regular rate & rhythm. No rubs, gallops or murmurs. Lungs: clear Abdomen: obese soft, nontender, nondistended. No hepatosplenomegaly. No bruits or masses. Good bowel sounds. Extremities: no cyanosis, clubbing, rash,  edema Neuro: alert & orientedx3, cranial nerves grossly intact. moves all 4 extremities w/o difficulty. Affect pleasant  Feeling better with diuresis and med adjustments. Cath films reviewed personally with IC team. Distal LAD relatively small target and with poorly controlled DM2, suspect risk of CABG probably outweighs benefit at this point. Will plan PCI/stenting of LAD later today with Dr. Burt Knack. Would switch digoxin back to b-blocker prior to d/c. Agree with switch to ARB/ARNI.   Glori Bickers, MD  1:35 PM

## 2022-09-18 NOTE — Interval H&P Note (Signed)
History and Physical Interval Note:  09/18/2022 2:40 PM  Julie Hernandez  has presented today for surgery, with the diagnosis of cad.  The various methods of treatment have been discussed with the patient and family. After consideration of risks, benefits and other options for treatment, the patient has consented to  Procedure(s): CORONARY STENT INTERVENTION (N/A) as a surgical intervention.  The patient's history has been reviewed, patient examined, no change in status, stable for surgery.  I have reviewed the patient's chart and labs.  Questions were answered to the patient's satisfaction.     Sherren Mocha

## 2022-09-18 NOTE — H&P (View-Only) (Signed)
Advanced Heart Failure Rounding Note  PCP-Cardiologist: Glori Bickers, MD   Subjective:   LHC yesterday: Cath w/ 90% in-stent restenosis of mLAD and CTO mRCA with left-to-right collaterals. LCx stent widely patent. Medical management for now. Considering CABG but needs better DM control. A1c 13.1.  RHC: elevated R+ L filling pressures and low output, RA 23, PCWP 30, CI 2.0   CP 5/10, not worst since yesterday. Stable. SOB with activity and long conversations. Stable at rest.   Diuresed w/ 40mg  IV lasix yesterday, pressures elevated on cath. Weight up, -1.7 l UOP  Objective:   Weight Range: 87.7 kg Body mass index is 35.36 kg/m.   Vital Signs:   Temp:  [97.9 F (36.6 C)-98.6 F (37 C)] 98.6 F (37 C) (10/31 0713) Pulse Rate:  [0-103] 76 (10/31 0713) Resp:  [12-22] 14 (10/31 0713) BP: (90-126)/(56-96) 90/60 (10/31 0713) SpO2:  [80 %-97 %] 92 % (10/31 0713) Weight:  [87.7 kg] 87.7 kg (10/31 0424)   Weight change: Filed Weights   09/17/22 0200 09/18/22 0424  Weight: 86.2 kg 87.7 kg    Intake/Output:   Intake/Output Summary (Last 24 hours) at 09/18/2022 0819 Last data filed at 09/18/2022 0429 Gross per 24 hour  Intake 357.74 ml  Output 1700 ml  Net -1342.26 ml     Physical Exam    General:  well appearing.  No respiratory difficulty HEENT: normal Neck: supple. JVD ~8 cm. Carotids 2+ bilat; no bruits. No lymphadenopathy or thyromegaly appreciated. Cor: PMI nondisplaced. Regular rate & rhythm. No rubs, gallops or murmurs. Lungs: clear Abdomen: soft, nontender, nondistended. No hepatosplenomegaly. No bruits or masses. Good bowel sounds. Extremities: no cyanosis, clubbing, rash, edema  Neuro: alert & oriented x 3, cranial nerves grossly intact. moves all 4 extremities w/o difficulty. Affect pleasant.   Telemetry   NSR 80s 2-4 PVCs (Personally reviewed)    EKG    Normal sinus rhythm 76 bpm Anterior infarct , age undetermined  Labs    CBC Recent Labs     09/17/22 0659 09/17/22 1157 09/17/22 1158 09/18/22 0018  WBC 8.4  --   --  6.6  HGB 12.1   < > 12.2 11.7*  HCT 39.6   < > 36.0 37.5  MCV 91.0  --   --  90.6  PLT 264  --   --  269   < > = values in this interval not displayed.   Basic Metabolic Panel Recent Labs    09/17/22 0659 09/17/22 1157 09/17/22 1158 09/18/22 0018  NA 142   < > 140 140  K 3.5   < > 3.3* 3.9  CL 111  --   --  104  CO2 19*  --   --  25  GLUCOSE 269*  --   --  318*  BUN 16  --   --  16  CREATININE 0.98  --   --  1.09*  CALCIUM 8.7*  --   --  9.0   < > = values in this interval not displayed.   Liver Function Tests No results for input(s): "AST", "ALT", "ALKPHOS", "BILITOT", "PROT", "ALBUMIN" in the last 72 hours. No results for input(s): "LIPASE", "AMYLASE" in the last 72 hours. Cardiac Enzymes No results for input(s): "CKTOTAL", "CKMB", "CKMBINDEX", "TROPONINI" in the last 72 hours.  BNP: BNP (last 3 results) No results for input(s): "BNP" in the last 8760 hours.  ProBNP (last 3 results) No results for input(s): "PROBNP" in the last 8760  hours.   D-Dimer No results for input(s): "DDIMER" in the last 72 hours. Hemoglobin A1C Recent Labs    09/18/22 0018  HGBA1C 13.1*   Fasting Lipid Panel Recent Labs    09/17/22 0659  CHOL 157  HDL 33*  LDLCALC 86  TRIG 188*  CHOLHDL 4.8   Thyroid Function Tests No results for input(s): "TSH", "T4TOTAL", "T3FREE", "THYROIDAB" in the last 72 hours.  Invalid input(s): "FREET3"  Other results:   Imaging    CARDIAC CATHETERIZATION  Result Date: 09/17/2022 Conclusions: Severe two vessel coronary artery disease, including 90% in-stent restenosis of mid LAD and chronic total occlusion of mid RCA with left-to-right collaterals. Widely patent mid LCx stent. Severely elevated left and right heart filling pressures. Mildly reduced cardiac output/index. Occluded bilateral radial arteries. Recommendations: Angiograms and hemodynamics reviewed with  Dr. Tamala Julian.  We have agreed to defer PCI to LAD in-stent restenosis in favor of optimizing medical therapy.  Benefit of PCI to mid LAD is uncertain given prior cardiac MRI demonstrating mixture of viable and nonviable myocardium in the LAD territory on MRI in 2021. Escalate diuresus and goal directed medical therapy.  Lisinopril has been switched to losartan in the hope of transitioning to [facility] in the coming days. Aggressive secondary prevention of coronary artery disease. Nelva Bush, MD [facility]  ECHOCARDIOGRAM COMPLETE  Result Date: 09/17/2022    ECHOCARDIOGRAM REPORT   Patient Name:   Julie Hernandez Date of Exam: 09/17/2022 Medical Rec #:  [id number]      Height:       62.0 in Accession #:    [id number]     Weight:       190.0 lb Date of Birth:  1971/07/13     BSA:          1.870 m Patient Age:    51 years       BP:           104/77 mmHg Patient Gender: F              HR:           99 bpm. Exam Location:  Inpatient Procedure: 2D Echo, Cardiac Doppler, Color Doppler and Intracardiac            Opacification Agent STAT ECHO Indications:    CHF-Acute Systolic [id number]  History:        Patient has prior history of Echocardiogram examinations, most                 recent 04/06/2021. CHF, CAD and NSTEMI, Prior CABG,                 Arrythmias:non-specific ST changes; Risk Factors:Diabetes and                 Hypertension.  Sonographer:    Darlina Sicilian RDCS Referring Phys: [place]  1. Left ventricular ejection fraction, by estimation, is 20%. The left ventricle has severely decreased function. The left ventricle demonstrates global hypokinesis. The left ventricular internal cavity size was moderately dilated. Indeterminate diastolic filling due to E-A fusion.  2. Right ventricular systolic function is mildly reduced. The right ventricular size is normal. Tricuspid regurgitation signal is inadequate for assessing PA pressure.  3. Left atrial size was mildly dilated.  4. The mitral  valve is grossly normal. Mild to moderate mitral valve regurgitation with some splay artifact suggesting eccentric component to jet. No evidence of mitral stenosis.  5. The aortic valve  is tricuspid. Aortic valve regurgitation is trivial. No aortic stenosis is present.  6. The inferior vena cava is normal in size with greater than 50% respiratory variability, suggesting right atrial pressure of 3 mmHg.  7. Cannot exclude a small PFO. Conclusion(s)/Recommendation(s): No left ventricular mural or apical thrombus/thrombi. FINDINGS  Left Ventricle: Left ventricular ejection fraction, by estimation, is 20%. The left ventricle has severely decreased function. The left ventricle demonstrates global hypokinesis. Definity contrast agent was given IV to delineate the left ventricular endocardial borders. The left ventricular internal cavity size was moderately dilated. There is no left ventricular hypertrophy. Indeterminate diastolic filling due to E-A fusion. Right Ventricle: The right ventricular size is normal. No increase in right ventricular wall thickness. Right ventricular systolic function is mildly reduced. Tricuspid regurgitation signal is inadequate for assessing PA pressure. Left Atrium: Left atrial size was mildly dilated. Right Atrium: Right atrial size was normal in size. Pericardium: Trivial pericardial effusion is present. Mitral Valve: The mitral valve is grossly normal. Mild to moderate mitral valve regurgitation. No evidence of mitral valve stenosis. Tricuspid Valve: The tricuspid valve is grossly normal. Tricuspid valve regurgitation is trivial. No evidence of tricuspid stenosis. Aortic Valve: The aortic valve is tricuspid. Aortic valve regurgitation is trivial. No aortic stenosis is present. Pulmonic Valve: The pulmonic valve was grossly normal. Pulmonic valve regurgitation is trivial. No evidence of pulmonic stenosis. Aorta: The aortic root, ascending aorta and aortic arch are all structurally normal,  with no evidence of dilitation or obstruction. Venous: The inferior vena cava is normal in size with greater than 50% respiratory variability, suggesting right atrial pressure of 3 mmHg. IAS/Shunts: Cannot exclude a small PFO.  LEFT VENTRICLE PLAX 2D LVIDd:         6.00 cm      Diastology LVIDs:         5.50 cm      LV e' medial:   5.56 cm/s LV PW:         0.80 cm      LV E/e' medial: 19.3 LV IVS:        0.70 cm LVOT diam:     2.00 cm LV SV:         28 LV SV Index:   15 LVOT Area:     3.14 cm  LV Volumes (MOD) LV vol d, MOD A2C: 197.0 ml LV vol d, MOD A4C: 232.0 ml LV vol s, MOD A2C: 152.0 ml LV vol s, MOD A4C: 190.0 ml LV SV MOD A2C:     45.0 ml LV SV MOD A4C:     232.0 ml LV SV MOD BP:      45.1 ml RIGHT VENTRICLE RV S prime:     10.90 cm/s TAPSE (M-mode): 1.7 cm LEFT ATRIUM             Index        RIGHT ATRIUM          Index LA diam:        4.10 cm 2.19 cm/m   RA Area:     8.36 cm LA Vol (A2C):   51.3 ml 27.43 ml/m  RA Volume:   14.30 ml 7.65 ml/m LA Vol (A4C):   78.9 ml 42.18 ml/m LA Biplane Vol: 65.0 ml 34.75 ml/m  AORTIC VALVE             PULMONIC VALVE LVOT Vmax:   56.40 cm/s  PR End Diast Vel: 10.50 msec LVOT Vmean:  39.900 cm/s  RVOT Peak grad:   1 mmHg LVOT VTI:    0.089 m  AORTA Ao Root diam: 2.80 cm MITRAL VALVE MV Area (PHT): 3.95 cm     SHUNTS MV Decel Time: 192 msec     Systemic VTI:  0.09 m MV E velocity: 107.33 cm/s  Systemic Diam: 2.00 cm                             Pulmonic VTI:  0.085 m Cherlynn Kaiser MD Electronically signed by Cherlynn Kaiser MD Signature Date/Time: 09/17/2022/10:25:04 AM    Final      Medications:     Scheduled Medications:  aspirin EC  81 mg Oral Daily   carvedilol  3.125 mg Oral BID WC   clopidogrel  75 mg Oral Daily   doxycycline  100 mg Oral BID   enoxaparin (LOVENOX) injection  40 mg Subcutaneous Q24H   escitalopram  10 mg Oral Daily   ezetimibe  10 mg Oral Daily   furosemide  40 mg Intravenous BID   insulin glargine-yfgn  8 Units Subcutaneous QHS    losartan  25 mg Oral Daily   sodium chloride flush  3 mL Intravenous Q12H   spironolactone  12.5 mg Oral Daily    Infusions:  sodium chloride Stopped (09/17/22 1504)   sodium chloride      PRN Medications: sodium chloride, acetaminophen, nitroGLYCERIN, ondansetron, sodium chloride flush    Patient Profile   51 y.o. 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 EF ultimately improved after LAD PCI. Now admitted w/ NSTEMI due to occluded LAD stent and new CTO of RCA w/ L>>R collaterals, previously placed LCx stent widely patent. LVEF back down, 20%, RV mildly reduced. RHC w/ elevated R + L Filling pressures and low output.   Assessment/Plan  1. CAD/ NSTEMI  - prior anterior MI w/ h/o PCI to mLAD and mLCX - HS trop peak 2,501 - Echo w/ drop in EF, from 55% >> 20%, RV mildly reduced - Cath yesterday w/ 90% in-stent restenosis of mLAD and CTO mRCA with left-to-right collaterals. LCx stent widely patent. PCI deferred. Medical management for now until better optimized from HF standpoint. Ideally, would recommend CABG however not ideal candidate w/ poorly controlled T2DM w/ Hgb A1c 13. Will discuss w/ CT surgery and IC team regarding medical management>>bridge to CABG once better glycemic control vs PCI of occluded LAD stent.  - IV heparin + ASA/ Plavix  - statin intolerant, on Repatha PTA - hold ? blocker w/ low output    2. Acute Systolic Heart Failure - h/o ICM following prior anterior MI, EF previously 25-30%, improved post LAD and LCx PCI - Echo 9/21 EF 60-65% - Echo 04/06/21 EF 50-55% mild anterior and apical HK - Echo this admit, EF down 20%, RV mildly reduced in setting of LAD and RCA infarcts, previously placed mLAD stent w/ 99% stenosis, CTO mRCA w/ L>R collaterals  - RHC w/ elevated R+ L filling pressures and low output, RA  23, PCWP 30, CI 2.0  - diurese w/ IV Lasix 40 mg bid, can continue through today, may transition to PO tomorrow - on lisinopril PTA, bridge w/ Losartan w/ eventual transition to Entresto in 48 hr. BP stable but on the lower side, may not tolerate Entresto - Continue  spiro 12.5 mg daily  - hold ? blocker w/ low output - add digoxin 0.125 mg  - no SGLT2i w/ poorly controlled DM  - plan eventual PCI vs CABG per above   3. Type 2DM - poorly controlled, in setting of poor compliance w/ regimen - previous Hgb A1c 13.3 (12/22) - A1c 10/31 13.1   4. HLD w/ LDL Goal < 55 - statin intolerant - on Repatha PTA + Zetia, reports compliance   - LDL 86 on admit  - needs f/u in lipid clinic, may need trial of very low dose statin as adjunct    5. H/o Tobacco Use - quit cigarettes, currently vapes   Length of Stay: [place] AGACNP-BC  09/18/2022, 8:19 AM  Advanced Heart Failure Team Pager [phone] (M-F; 7a - 5p)  Please contact [facility] for night-coverage after hours (5p -7a ) and weekends on [email or web address]   Patient seen and examined with the above-signed Advanced Practice Provider and/or Housestaff. I personally reviewed laboratory data, imaging studies and relevant notes. I independently examined the patient and formulated the important aspects of the plan. I have edited the note to reflect any of my changes or salient points. I have personally discussed the plan with the patient and/or family.  Feeling better with diuresis. No further CP, orthopnea or PND. Renal function stable.   General:  Well appearing. No resp difficulty HEENT: normal Neck: supple. no JVD. Carotids 2+ bilat; no bruits. No lymphadenopathy or thryomegaly appreciated. Cor: PMI nondisplaced. Regular rate & rhythm. No rubs, gallops or murmurs. Lungs: clear Abdomen: obese soft, nontender, nondistended. No hepatosplenomegaly. No bruits or masses. Good bowel sounds. Extremities: no cyanosis, clubbing, rash,  edema Neuro: alert & orientedx3, cranial nerves grossly intact. moves all 4 extremities w/o difficulty. Affect pleasant  Feeling better with diuresis and med adjustments. Cath films reviewed personally with IC team. Distal LAD relatively small target and with poorly controlled DM2, suspect risk of CABG probably outweighs benefit at this point. Will plan PCI/stenting of LAD later today with Dr. Burt Knack. Would switch digoxin back to b-blocker prior to d/c. Agree with switch to ARB/ARNI.   Glori Bickers, MD  1:35 PM

## 2022-09-19 ENCOUNTER — Encounter (HOSPITAL_COMMUNITY): Payer: PPO | Admitting: Internal Medicine

## 2022-09-19 ENCOUNTER — Encounter (HOSPITAL_COMMUNITY): Payer: Self-pay

## 2022-09-19 ENCOUNTER — Encounter (HOSPITAL_COMMUNITY): Payer: Self-pay | Admitting: Cardiovascular Disease

## 2022-09-19 ENCOUNTER — Telehealth (HOSPITAL_COMMUNITY): Payer: Self-pay

## 2022-09-19 ENCOUNTER — Other Ambulatory Visit (HOSPITAL_COMMUNITY): Payer: Self-pay

## 2022-09-19 DIAGNOSIS — I214 Non-ST elevation (NSTEMI) myocardial infarction: Secondary | ICD-10-CM | POA: Diagnosis not present

## 2022-09-19 LAB — CBC
HCT: 39.8 % (ref 36.0–46.0)
Hemoglobin: 12.1 g/dL (ref 12.0–15.0)
MCH: 27.6 pg (ref 26.0–34.0)
MCHC: 30.4 g/dL (ref 30.0–36.0)
MCV: 90.7 fL (ref 80.0–100.0)
Platelets: 304 10*3/uL (ref 150–400)
RBC: 4.39 MIL/uL (ref 3.87–5.11)
RDW: 13.7 % (ref 11.5–15.5)
WBC: 7 10*3/uL (ref 4.0–10.5)
nRBC: 0 % (ref 0.0–0.2)

## 2022-09-19 LAB — GLUCOSE, CAPILLARY
Glucose-Capillary: 170 mg/dL — ABNORMAL HIGH (ref 70–99)
Glucose-Capillary: 264 mg/dL — ABNORMAL HIGH (ref 70–99)
Glucose-Capillary: 195 mg/dL — ABNORMAL HIGH (ref 70–99)
Glucose-Capillary: 214 mg/dL — ABNORMAL HIGH (ref 70–99)

## 2022-09-19 LAB — BASIC METABOLIC PANEL
Anion gap: 9 (ref 5–15)
BUN: 22 mg/dL — ABNORMAL HIGH (ref 6–20)
CO2: 28 mmol/L (ref 22–32)
Calcium: 9.1 mg/dL (ref 8.9–10.3)
Chloride: 103 mmol/L (ref 98–111)
Creatinine, Ser: 0.97 mg/dL (ref 0.44–1.00)
GFR, Estimated: >60 mL/min (ref >60)
Glucose, Bld: 164 mg/dL — ABNORMAL HIGH (ref 70–99)
Potassium: 4 mmol/L (ref 3.5–5.1)
Sodium: 140 mmol/L (ref 135–145)

## 2022-09-19 MED ORDER — SPIRONOLACTONE 25 MG PO TABS
25.0000 mg | ORAL_TABLET | Freq: Every day | ORAL | Status: DC
  Administered 2022-09-19 – 2022-09-20 (×2): 25 mg via ORAL
  Filled 2022-09-19 (×2): qty 1

## 2022-09-19 NOTE — Plan of Care (Signed)
Nutrition Education Note  RD received verbal consult for nutrition education regarding CHF and diabetes.  Lab Results  Component Value Date   HGBA1C 13.1 (H) 09/18/2022     RD provided "Carbohydrate Counting for People with Diabetes" and "Heart Failure Nutrition Therapy" handouts from the Academy of [employer] and Dietetics. Reviewed patient's dietary recall. Provided examples on ways to decrease sodium intake in diet. Discouraged intake of processed foods and use of salt shaker. Encouraged fresh fruits and vegetables as well as whole grain sources of carbohydrates to maximize fiber intake.   RD discussed why it is important for patient to adhere to diet recommendations, and emphasized the role of fluids, foods to avoid, and importance of weighing self daily.   Discussed different food groups and their effects on blood sugar, emphasizing carbohydrate-containing foods. Provided list of carbohydrates and recommended serving sizes of common foods.  Discussed importance of controlled and consistent carbohydrate intake throughout the day. Provided examples of ways to balance meals/snacks and encouraged intake of high-fiber, whole grain complex carbohydrates. Teach back method used.  Expect good compliance.  Body mass index is 35.08 kg/m. Pt meets criteria for Obesity II based on current BMI.  Current diet order is Heart Healthy/Carb Modified, patient is consuming approximately 100% of meals at this time. Labs and medications reviewed. No further nutrition interventions warranted at this time. RD contact information provided. If additional nutrition issues arise, please re-consult RD.   Julie Hernandez RD, LDN For contact information, refer to [facility].

## 2022-09-19 NOTE — TOC Progression Note (Signed)
Transition of Care [facility]) - Progression Note    Patient Details  Name: Julie Hernandez MRN: [id number] Date of Birth: 10-Sep-1971  Transition of Care [facility]) CM/SW Contact  Zenon Mayo, RN Phone Number: 09/19/2022, 5:59 PM  Clinical Narrative:     from home alone, NSTEMI, had stent placed, cons on iv lasix, plavix.  TOC following.        Expected Discharge Plan and Services                                                 Social Determinants of Health (SDOH) Interventions    Readmission Risk Interventions     No data to display

## 2022-09-19 NOTE — Discharge Summary (Incomplete)
Advanced Heart Failure Team  Discharge Summary   Patient ID: Julie Hernandez MRN: [id number], DOB/AGE: Aug 26, 1971 51 y.o. Admit date: 09/16/2022 D/C date:     09/20/2022   Primary Discharge Diagnoses:  1.NSTEMI with CAD 2.Acute systolic CHF  3.Hyperlipidemia 4.Uncontrolled DM II 5.Tobacco use 6. Hypokalemia  Hospital Course:  Julie Hernandez is a 51 y.o. 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   Had anterior MI in 10'. Stent was placed emergently. Post stenting she continued to have CP. Went to [facility] that same year and had a second stent placed.   Cath 02/08/20 at [facility] [address] for NSTEMI. 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    5/21 was seen with daily angina. cMRI showed EF 26% only minimal viability in anterior wall. Decision to proceed with PCI of LCX and LAD (ISR) over CABG. Had successful PCI of LCX and LAD with Dr. Burt Knack on 5/20.    Echo 9/21 EF 60-65% Echo 04/06/21 EF 50-55% mild anterior and apical HK.  Presented 10/30 w/ CP and SOB. Went for [facility] which showed 90% in-stent restenosis of mLAD and CTO mRCA with left-to-right collaterals. LCx stent widely patent. Considered CABG but A1c 13.1. Ultimately underwent PCI w/ DES to mid LAD. Symptoms resolved. Denies CP and SOB. Counseled on importance of vaping cessation and taking all medications as prescribed. DB coordinator and nutrition saw while admitted.   Pt will continue to be followed closely in the HF clinic. Dr Haroldine Laws evaluated and deemed appropriate for discharge. F/u scheduled.  See below for detailed problem list: 1. CAD/ NSTEMI  - prior anterior MI w/ h/o PCI to mLAD and mLCX - HS trop peak 2,501 - Echo w/ drop in EF, from 55% >> 20%, RV  mildly reduced - LCath -90% in-stent restenosis of mLAD and CTO mRCA with left-to-right collaterals. LCx stent widely patent.  - S/P PCI DES mid LAD .  - Plan aspirin + clopidogrel at least 12 months but hopefully long term DAPT. - statin intolerant, on Repatha PTA  2. Acute Systolic Heart Failure - Previous history of  ICM following prior anterior MI, EF previously 25-30%, improved post LAD and LCx PCI - Echo 9/21 EF 60-65% - Echo 04/06/21 EF 50-55% mild anterior and apical HK - Echo this admit, EF down 20%, RV mildly reduced in setting of LAD and RCA infarcts, previously placed mLAD stent w/ 99% stenosis, CTO mRCA w/ L>R collaterals  - RHC w/ elevated R+ L filling pressures and low output, RA 23, PCWP 30, CI 2.0  - Volume status improving. On 40 mg QOD at home, does better with daily meds, will send home with 20 mg daily and instructions to take extra 20 mg w/ 3lb weight gain or noticed edema - Continue spiro 25 mg daily   - restart carvedilol 3.125 BID - stop digoxin 0.125 mg  - Continue losartan 25 mg daily. Consider switching down the road, unable to d/t soft BP.  - BP soft, denies dizziness - no SGLT2i w/ poorly controlled DM  3. Type 2DM - poorly controlled, in setting of poor compliance w/ regimen - previous Hgb A1c 13.3 (12/22), repeat A1c 10/31 13.1 - Hold off on SGLT2i until better  controlled. - Per DB coordinator, will try to get on Ozempic, pending PA 4. HLD w/ LDL Goal < 55 - statin intolerant - on Repatha PTA + Zetia, reports compliance   - LDL 86 on admit  - needs f/u in lipid clinic, may need trial of very low dose statin as adjunct  5. H/o Tobacco Use - quit cigarettes, currently vapes   Discharge Weight Range: 86.4kg  Discharge Vitals: Blood pressure (!) 93/55, pulse 70, temperature 97.8 F (36.6 C), temperature source Oral, resp. rate 15, height _0  (1.575 m), weight 86.4 kg, SpO2 99 %.  Labs: Lab Results  Component Value Date   WBC 7.0 09/19/2022   HGB  12.1 09/19/2022   HCT 39.8 09/19/2022   MCV 90.7 09/19/2022   PLT 304 09/19/2022    Recent Labs  Lab 09/20/22 0020  NA 139  K 3.3*  CL 102  CO2 25  BUN 22*  CREATININE 0.96  CALCIUM 8.9  GLUCOSE 262*   Lab Results  Component Value Date   CHOL 157 09/17/2022   HDL 33 (L) 09/17/2022   LDLCALC 86 09/17/2022   TRIG 188 (H) 09/17/2022   BNP (last 3 results) No results for input(s): "BNP" in the last 8760 hours.  ProBNP (last 3 results) No results for input(s): "PROBNP" in the last 8760 hours.   Diagnostic Studies/Procedures   CARDIAC CATHETERIZATION  Result Date: 09/18/2022   Ost LM lesion is 25% stenosed.   Mid LAD lesion is 90% stenosed.   Mid RCA lesion is 100% stenosed.   1st Diag lesion is 60% stenosed.   Non-stenotic Prox Cx to Dist Cx lesion was previously treated.   A drug-eluting stent was successfully placed using a STENT ONYX FRONTIER 2.0X18.   Post intervention, there is a 0% residual stenosis. Successful PCI of severe in-stent restenosis in the mid LAD with a 2.0 x 18 mm resolute Onyx DES postdilated to high-pressure with a 2.25 mm noncompliant balloon Recommend: Dual antiplatelet therapy with aspirin and clopidogrel at least 12 months, but favor long-term DAPT in this patient with severe LV dysfunction, recurrent ACS, and history of multiple PCI procedures.    Discharge Medications   Allergies as of 09/20/2022       Reactions   Esomeprazole Magnesium Anaphylaxis   Ciprofibrate Itching   Ciprofloxacin Itching   Gabapentin Other (See Comments)   Achy - myalgia   Lubiprostone Diarrhea   Statins    Myalgia - flu like symptoms   Amlodipine Rash   Iron Rash, Swelling   Latex Rash, Swelling   Povidone Iodine Rash        Medication List     STOP taking these medications    empagliflozin 10 MG Tabs tablet Commonly known as: Jardiance   lisinopril 5 MG tablet Commonly known as: ZESTRIL       TAKE these medications    albuterol 108 (90 Base)  MCG/ACT inhaler Commonly known as: VENTOLIN HFA Inhale 2 puffs into the lungs every 4 (four) hours as needed for wheezing or shortness of breath.   aspirin EC 81 MG tablet Take 1 tablet (81 mg total) by mouth daily.   blood glucose meter kit and supplies Dispense based on patient and insurance preference. Use up to four times daily as directed. (FOR ICD-10 E10.9, E11.9).   carvedilol 3.125 MG tablet Commonly known as: COREG Take 1 tablet (3.125 mg total) by mouth 2 (two) times daily with a meal.   clopidogrel 75 MG tablet  Commonly known as: PLAVIX Take 75 mg by mouth daily.   doxycycline 100 MG capsule Commonly known as: VIBRAMYCIN Take 100 mg by mouth 2 (two) times daily. Notes to patient: Last day on 09/22/2022 (factoring in doses received in hospital)   escitalopram 10 MG tablet Commonly known as: LEXAPRO Take 10 mg by mouth daily.   ezetimibe 10 MG tablet Commonly known as: ZETIA Take 1 tablet (10 mg total) by mouth daily at 6pm. What changed: See the new instructions.   famotidine 40 MG tablet Commonly known as: PEPCID Take 40 mg by mouth daily as needed for heartburn.   FreeStyle Libre 3 Sensor Misc Place 1 sensor on the skin every 14 days. Use to check glucose continuously   furosemide 20 MG tablet Commonly known as: LASIX Take 1 tablet (20 mg total) by mouth daily. Take 20 mg daily, Take extra 20 mg with 3lb overnight weight gain, new swelling, or 5lb weight gain in 1 week. What changed:  how much to take when to take this additional instructions   insulin glargine 100 UNIT/ML Solostar Pen Commonly known as: LANTUS Inject 14 Units into the skin daily. Further refills by PCP What changed: how much to take   losartan 25 MG tablet Commonly known as: COZAAR Take 1 tablet (25 mg total) by mouth daily.   metFORMIN 500 MG tablet Commonly known as: GLUCOPHAGE Take 2 tablets (1,000 mg total) by mouth 2 (two) times daily with a meal.   nitroGLYCERIN 0.4 MG SL  tablet Commonly known as: NITROSTAT PLACE 1 TABLET UNDER THE TONGUE AND ALLOW TO DISSOLVE SLOWLY, DO NOT CHEW OR SWALLOW. YOU MAY USE ADDITIONAL TABLETS EVERY 5 MINUTES BUT NO MORE THAN 3 TABLETS. What changed: See the new instructions.   ondansetron 4 MG disintegrating tablet Commonly known as: Zofran ODT Take 1 tablet (4 mg total) by mouth every 8 (eight) hours as needed for nausea or vomiting.   promethazine-dextromethorphan 6.25-15 MG/5ML syrup Commonly known as: PROMETHAZINE-DM Take 5 mLs by mouth daily as needed for cough.   RABEprazole 20 MG tablet Commonly known as: ACIPHEX Take 40 mg by mouth daily.   spironolactone 25 MG tablet Commonly known as: ALDACTONE Take 1 tablet (25 mg total) by mouth daily. What changed: how much to take        Disposition   The patient will be discharged in stable condition to home. Discharge Instructions     (HEART FAILURE PATIENTS) Call MD:  Anytime you have any of the following symptoms: 1) 3 pound weight gain in 24 hours or 5 pounds in 1 week 2) shortness of breath, with or without a dry hacking cough 3) swelling in the hands, feet or stomach 4) if you have to sleep on extra pillows at night in order to breathe.   Complete by: As directed    Amb Referral to Cardiac Rehabilitation   Complete by: As directed    [facility]   Diagnosis:  Coronary Stents NSTEMI     After initial evaluation and assessments completed: Virtual Based Care may be provided alone or in conjunction with Phase 2 Cardiac Rehab based on patient barriers.: Yes   Intensive Cardiac Rehabilitation (ICR) [place] location only OR Traditional Cardiac Rehabilitation (TCR) *If criteria for ICR are not met will enroll in TCR [facility] only): Yes   Ambulatory referral to Endocrinology   Complete by: As directed    Had seen Dr Cruzita Lederer in June 2021 - would be open to following with anyone  from office   Ambulatory referral to Nutrition and Diabetic Education   Complete by: As  directed    Diet - low sodium heart healthy   Complete by: As directed    Diet Carb Modified   Complete by: As directed    Increase activity slowly   Complete by: As directed        Follow-up Information     [place] Follow up on 10/09/2022.   Specialty: Cardiology Why: Advanced Heart Failure Clinic 10:30 am Entrance C, Free Valet Parking Please bring all meds to appointment Contact information: [address] [id number] [place] [phone]                  Duration of Discharge Encounter: Greater than 35 minutes   Signed, Earnie Larsson AGACNP-BC  09/20/2022, 11:45 AM   Patient seen and examined with the above-signed Advanced Practice Provider and/or Housestaff. I personally reviewed laboratory data, imaging studies and relevant notes. I independently examined the patient and formulated the important aspects of the plan. I have edited the note to reflect any of my changes or salient points. I have personally discussed the plan with the patient and/or family.   [place] for d/c today on above meds. Refer to outpatient CR. Stressed need for better management of DM2 and abstinence from vaping.   Glori Bickers, MD  11:55 AM

## 2022-09-19 NOTE — TOC Benefit Eligibility Note (Signed)
Patient Teacher, English as a foreign language completed.    The patient is currently admitted and upon discharge could be taking Ozempic.  PA Required  The patient is insured through [employer] Patient Advocate Specialist [place]  Patient Advocate Team Direct Number: [phone] Fax: [phone]

## 2022-09-19 NOTE — Progress Notes (Addendum)
Inpatient Diabetes Program Recommendations  AACE/ADA: New Consensus Statement on Inpatient Glycemic Control (2015)  Target Ranges:  Prepandial:   less than 140 mg/dL      Peak postprandial:   less than 180 mg/dL (1-2 hours)      Critically ill patients:  140 - 180 mg/dL   Lab Results  Component Value Date   GLUCAP 170 (H) 09/19/2022   HGBA1C 13.1 (H) 09/18/2022    Review of Glycemic Control  Latest Reference Range & Units 09/17/22 15:00 09/18/22 17:24 09/18/22 21:06 09/19/22 06:17  Glucose-Capillary 70 - 99 mg/dL 270 (H) 217 (H) 292 (H) 170 (H)   Diabetes history: DM 2 Outpatient Diabetes medications: Jardiance 10 mg, Lantus 8 units daily Current orders for Inpatient glycemic control:  Novolog 0-15 units tid with meals and HS Semglee 8 units daily  Inpatient Diabetes Program Recommendations:    Spoke with CHF pharmacist regarding outpatient plan for patient.  May benefit from GLP-1 (weekly) to assist with PP blood sugars and also weight loss.  Will ask pharmacy to do benefits check.    Consider increasing Semglee to 15 units daily.  Will discuss A1C and compliance with patient today.   Thanks,   Adah Perl, RN, BC-ADM Inpatient Diabetes Coordinator Pager [phone]  (8a-5p)  Addendum 12 noon:  Spoke at length to patient regarding home DM management.  She admits that she has been frustrated with her DM due to the "medicines not working".  She has not had adjustment of her insulin since it was prescribed in 2021. She is tired of doing finger sticks and states that she has been out of both insulin and Jardiance the last few weeks. Discussed A1c results with patient.  She also reports going to urgent care last week for sinus infection/chest tightness and prednisone was prescribed which likely increased blood sugars as well. Discussed importance of glycemic control and need for medication adjustment.  We discussed physiology of type 2 DM and insulin resistance.  Patient states  that she was able to get A1C down to 6% in the past through diet.  She states she attempts to eat low CHO diet and does not drink sweetened beverages.  Per chart review patient did see Dr. Cruzita Lederer in 2021 and the plan was to possibly start GLP-1 at next visit but it does not look like she had f/u appointment.   Discussed GLP-1 and the benefits of this class of drug.  She is very interested.  CHF team hopefully can provide referral.  Discussed also CGM for monitoring to reduce pain in fingers and to provide more comprehensive data for medication adjustments.  Explained that she needs more insulin for now and we discussed glucose goals. She agrees to re-consult with endocrinology as well.  Will follow.  May also consider restarting Metformin 500 mg bid to help with blood sugars with Semglee until she can see Endocrinology.

## 2022-09-19 NOTE — Progress Notes (Addendum)
Advanced Heart Failure Rounding Note  PCP-Cardiologist: Glori Bickers, MD   Subjective:   10/30 - LHC /RHC 90% in-stent restenosis of mLAD and CTO mRCA with left-to-right collaterals. LCx stent widely patent. Elevated filling pressures CO 3.8 CI 2.  10/31- S/P PCI DES to mild LAD. Evening down of lasix held due to hypotension.   Feels better. Denies chest pain. Denies SOB.    Objective:   Weight Range: 87 kg Body mass index is 35.08 kg/m.   Vital Signs:   Temp:  [98 F (36.7 C)-99.1 F (37.3 C)] 98 F (36.7 C) (11/01 0338) Pulse Rate:  [0-102] 79 (11/01 0338) Resp:  [12-20] 18 (11/01 0338) BP: (82-111)/(64-83) 107/78 (11/01 0338) SpO2:  [86 %-99 %] 94 % (11/01 0338) Weight:  [86.4 kg-87 kg] 87 kg (11/01 0625)   Weight change: Filed Weights   09/18/22 0424 09/18/22 1333 09/19/22 0625  Weight: 87.7 kg 86.4 kg 87 kg    Intake/Output:   Intake/Output Summary (Last 24 hours) at 09/19/2022 0728 Last data filed at 09/19/2022 0620 Gross per 24 hour  Intake 92.45 ml  Output 2550 ml  Net -2457.55 ml     Physical Exam   General:  In bed. No resp difficulty HEENT: normal Neck: supple. JVP difficult to assess. Carotids 2+ bilat; no bruits. No lymphadenopathy or thryomegaly appreciated. Cor: PMI nondisplaced. Regular rate & rhythm. No rubs, gallops or murmurs. Lungs: clear on 2 liters  Abdomen: soft, nontender, nondistended. No hepatosplenomegaly. No bruits or masses. Good bowel sounds. Extremities: no cyanosis, clubbing, rash, edema. R groin site ok.  Neuro: alert & orientedx3, cranial nerves grossly intact. moves all 4 extremities w/o difficulty. Affect pleasant  Telemetry  NSR 70-80s  EKG    N/A Labs    CBC Recent Labs    09/18/22 0018 09/19/22 0026  WBC 6.6 7.0  HGB 11.7* 12.1  HCT 37.5 39.8  MCV 90.6 90.7  PLT 269 169   Basic Metabolic Panel Recent Labs    09/18/22 0018 09/19/22 0026  NA 140 140  K 3.9 4.0  CL 104 103  CO2 25 28  GLUCOSE  318* 164*  BUN 16 22*  CREATININE 1.09* 0.97  CALCIUM 9.0 9.1   Liver Function Tests No results for input(s): "AST", "ALT", "ALKPHOS", "BILITOT", "PROT", "ALBUMIN" in the last 72 hours. No results for input(s): "LIPASE", "AMYLASE" in the last 72 hours. Cardiac Enzymes No results for input(s): "CKTOTAL", "CKMB", "CKMBINDEX", "TROPONINI" in the last 72 hours.  BNP: BNP (last 3 results) No results for input(s): "BNP" in the last 8760 hours.  ProBNP (last 3 results) No results for input(s): "PROBNP" in the last 8760 hours.   D-Dimer No results for input(s): "DDIMER" in the last 72 hours. Hemoglobin A1C Recent Labs    09/18/22 0018  HGBA1C 13.1*   Fasting Lipid Panel Recent Labs    09/17/22 0659  CHOL 157  HDL 33*  LDLCALC 86  TRIG 188*  CHOLHDL 4.8   Thyroid Function Tests No results for input(s): "TSH", "T4TOTAL", "T3FREE", "THYROIDAB" in the last 72 hours.  Invalid input(s): "FREET3"  Other results:   Imaging    CARDIAC CATHETERIZATION  Result Date: 09/18/2022   Ost LM lesion is 25% stenosed.   Mid LAD lesion is 90% stenosed.   Mid RCA lesion is 100% stenosed.   1st Diag lesion is 60% stenosed.   Non-stenotic Prox Cx to Dist Cx lesion was previously treated.   A drug-eluting stent was successfully placed  using a STENT ONYX FRONTIER 2.0X18.   Post intervention, there is a 0% residual stenosis. Successful PCI of severe in-stent restenosis in the mid LAD with a 2.0 x 18 mm resolute Onyx DES postdilated to high-pressure with a 2.25 mm noncompliant balloon Recommend: Dual antiplatelet therapy with aspirin and clopidogrel at least 12 months, but favor long-term DAPT in this patient with severe LV dysfunction, recurrent ACS, and history of multiple PCI procedures.     Medications:     Scheduled Medications:  aspirin EC  81 mg Oral Daily   clopidogrel  75 mg Oral Daily   digoxin  0.125 mg Oral Daily   doxycycline  100 mg Oral BID   enoxaparin (LOVENOX) injection   40 mg Subcutaneous Q24H   escitalopram  10 mg Oral Daily   ezetimibe  10 mg Oral Daily   furosemide  40 mg Intravenous BID   insulin aspart  0-15 Units Subcutaneous TID WC   insulin aspart  0-5 Units Subcutaneous QHS   insulin glargine-yfgn  8 Units Subcutaneous QHS   losartan  25 mg Oral Daily   sodium chloride flush  3 mL Intravenous Q12H   spironolactone  12.5 mg Oral Daily    Infusions:  sodium chloride Stopped (09/17/22 1504)   sodium chloride      PRN Medications: sodium chloride, acetaminophen, nitroGLYCERIN, ondansetron (ZOFRAN) IV, ondansetron, sodium chloride flush    Patient Profile   51 y.o. 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 EF ultimately improved after LAD PCI. Now admitted w/ NSTEMI due to occluded LAD stent and new CTO of RCA w/ L>>R collaterals, previously placed LCx stent widely patent. LVEF back down, 20%, RV mildly reduced. RHC w/ elevated R + L Filling pressures and low output.   Assessment/Plan  1. CAD/ NSTEMI  - prior anterior MI w/ h/o PCI to mLAD and mLCX - HS trop peak 2,501 - Echo w/ drop in EF, from 55% >> 20%, RV mildly reduced - Cath -90% in-stent restenosis of mLAD and CTO mRCA with left-to-right collaterals. LCx stent widely patent.  - S/P PCI DES mid LAD .  - Plan aspirin + clopidogrel at least 12 months but hopefully long term DAPT. - statin intolerant, on Repatha PTA - hold ? blocker w/ low output    2. Acute Systolic Heart Failure - Previous history of  ICM following prior anterior MI, EF previously 25-30%, improved post LAD and LCx PCI - Echo 9/21 EF 60-65% - Echo 04/06/21 EF 50-55% mild anterior and apical HK - Echo this admit, EF down 20%, RV mildly reduced in setting of LAD and RCA infarcts, previously placed mLAD stent w/ 99% stenosis, CTO mRCA w/ L>R collaterals  - RHC  w/ elevated R+ L filling pressures and low output, RA 23, PCWP 30, CI 2.0  -Volume status improving but needs another dose. Give one more dose of IV lasix.  - Increase spiro 25 mg daily   - hold ? blocker w/ low output -digoxin 0.125 mg  - Continue losartan 25 mg daily. Consider switching down the road.  - no SGLT2i w/ poorly controlled DM    3. Type 2DM - poorly controlled, in setting of poor compliance w/ regimen - previous Hgb A1c 13.3 (12/22) - A1c 10/31 13.1 - On SSI - Hold off on SGLT2i until better controlled.  4. HLD w/ LDL Goal < 55 - statin intolerant - on Repatha PTA + Zetia, reports compliance   - LDL 86 on admit  - needs f/u in lipid clinic, may need trial of very low dose statin as adjunct    5. H/o Tobacco Use - quit cigarettes, currently vapes   Consult diabetes coordinator and cardiac rehab.   Length of Stay: 2  Amy Clegg NP-C  09/19/2022, 7:28 AM  Advanced Heart Failure Team Pager [phone] (M-F; 7a - 5p)  Please contact [facility] for night-coverage after hours (5p -7a ) and weekends on [email or web address]   Patient seen and examined with the above-signed Advanced Practice Provider and/or Housestaff. I personally reviewed laboratory data, imaging studies and relevant notes. I independently examined the patient and formulated the important aspects of the plan. I have edited the note to reflect any of my changes or salient points. I have personally discussed the plan with the patient and/or family.  S/p PCI of LAD yesterday. Denies CP or SOB. BP low yesterday. Now improved  General:  Well appearing. No resp difficulty HEENT: normal Neck: supple. JVP 7-8  Carotids 2+ bilat; no bruits. No lymphadenopathy or thryomegaly appreciated. Cor: PMI nondisplaced. Regular rate & rhythm. No rubs, gallops or murmurs. Lungs: clear Abdomen: soft, nontender, nondistended. No hepatosplenomegaly. No bruits or masses. Good bowel sounds. Extremities: no cyanosis, clubbing, rash,  edema Neuro: alert & orientedx3, cranial nerves grossly intact. moves all 4 extremities w/o difficulty. Affect pleasant  Stable post-PCI. Agree with IV lasix and restarting GDMT as tolerated. DM nurse to see. Refer CR.   Arvilla Meres, MD  10:26 AM

## 2022-09-19 NOTE — Progress Notes (Signed)
Heart Failure Nurse Navigator Progress Note    Brought patient a scale to bedside per request of CSW.    Earnestine Leys, BSN, Clinical cytogeneticist Only

## 2022-09-19 NOTE — Progress Notes (Addendum)
CARDIAC REHAB PHASE I   PRE:  Rate/Rhythm: 93 NSR  BP:  Sitting: 99/69      SaO2: 99 2L  MODE:  Ambulation: 340 ft   POST:  Rate/Rhythm: 106 NSR  BP:  Sitting: 95/74      SaO2: 98 2L  Pt was ambulated in the hallway with standby assistance with 2L. Pt walked well w/o cp and sob and was returned to room w/o complaints. Pt received MI book and was educated on stent location, aspirin and plavix, restrictions, risk factors (family hx, diabetes, vaping, HLD), heart healthy and diabetic diet, ex guidelines, using ntg and calling 911, smoking cessation, daily weights, and recording and reporting symptoms.  Pt will need HF book, scale for home, and would like dietician consult for questions. Case Astronomer.   Julie Hernandez  10:16 AM 09/19/2022

## 2022-09-19 NOTE — Telephone Encounter (Signed)
Pharmacy Patient Advocate Encounter  Insurance verification completed.    The patient is insured through [facility]   The patient is currently admitted and ran test claims for the following: Ozempic.  Copays and coinsurance results were relayed to Inpatient clinical team.

## 2022-09-19 NOTE — Progress Notes (Signed)
Mobility Specialist Progress Note    09/19/22 1545  Mobility  Activity Ambulated independently in hallway  Level of Assistance Standby assist, set-up cues, supervision of patient - no hands on  Assistive Device None  Distance Ambulated (ft) 700 ft  Activity Response Tolerated well  Mobility Referral Yes  $Mobility charge 1 Mobility    Pre-Mobility: 90 HR, 108/75(85) BP, 92% SpO2 Post-Mobility: 86 HR, 110/77 (86) BP, 93% SpO2  Pt received sitting EOB and agreeable. No complaints. SpO2 pleth unreliable during while pt on RA. Returned to sitting EOB with call bell in reach.   Hildred Alamin Mobility Specialist  Secure Chat Only

## 2022-09-20 ENCOUNTER — Other Ambulatory Visit (HOSPITAL_COMMUNITY): Payer: Self-pay

## 2022-09-20 LAB — GLUCOSE, CAPILLARY
Glucose-Capillary: 196 mg/dL — ABNORMAL HIGH (ref 70–99)
Glucose-Capillary: 285 mg/dL — ABNORMAL HIGH (ref 70–99)

## 2022-09-20 LAB — BASIC METABOLIC PANEL
Anion gap: 12 (ref 5–15)
BUN: 22 mg/dL — ABNORMAL HIGH (ref 6–20)
CO2: 25 mmol/L (ref 22–32)
Calcium: 8.9 mg/dL (ref 8.9–10.3)
Chloride: 102 mmol/L (ref 98–111)
Creatinine, Ser: 0.96 mg/dL (ref 0.44–1.00)
GFR, Estimated: >60 mL/min (ref >60)
Glucose, Bld: 262 mg/dL — ABNORMAL HIGH (ref 70–99)
Potassium: 3.3 mmol/L — ABNORMAL LOW (ref 3.5–5.1)
Sodium: 139 mmol/L (ref 135–145)

## 2022-09-20 LAB — MAGNESIUM: Magnesium: 1.9 mg/dL (ref 1.7–2.4)

## 2022-09-20 MED ORDER — SPIRONOLACTONE 25 MG PO TABS
25.0000 mg | ORAL_TABLET | Freq: Every day | ORAL | 5 refills | Status: DC
  Filled 2022-09-20: qty 30, 30d supply, fill #0

## 2022-09-20 MED ORDER — METFORMIN HCL 500 MG PO TABS
1000.0000 mg | ORAL_TABLET | Freq: Two times a day (BID) | ORAL | 3 refills | Status: DC
  Filled 2022-09-20: qty 360, 90d supply, fill #0

## 2022-09-20 MED ORDER — FUROSEMIDE 20 MG PO TABS: 20.0000 mg | ORAL_TABLET | Freq: Every day | ORAL | 11 refills | Status: DC

## 2022-09-20 MED ORDER — INSULIN GLARGINE 100 UNIT/ML SOLOSTAR PEN: 14.0000 [IU] | PEN_INJECTOR | Freq: Every day | SUBCUTANEOUS | 0 refills | Status: DC

## 2022-09-20 MED ORDER — ASPIRIN 81 MG PO TBEC
81.0000 mg | DELAYED_RELEASE_TABLET | Freq: Every day | ORAL | 11 refills | Status: DC
  Filled 2022-09-20: qty 30, 30d supply, fill #0

## 2022-09-20 MED ORDER — CARVEDILOL 3.125 MG PO TABS: 3.1250 mg | ORAL_TABLET | Freq: Two times a day (BID) | ORAL | Status: DC

## 2022-09-20 MED ORDER — FREESTYLE LIBRE 3 SENSOR MISC
1.0000 [IU] | Freq: Every day | 0 refills | Status: AC
  Filled 2022-09-20: qty 1, 14d supply, fill #0

## 2022-09-20 MED ORDER — FUROSEMIDE 20 MG PO TABS
20.0000 mg | ORAL_TABLET | Freq: Every day | ORAL | Status: DC
  Administered 2022-09-20: 20 mg via ORAL
  Filled 2022-09-20: qty 1

## 2022-09-20 MED ORDER — INSULIN GLARGINE 100 UNIT/ML SOLOSTAR PEN
14.0000 [IU] | PEN_INJECTOR | Freq: Every day | SUBCUTANEOUS | 0 refills | Status: DC
  Filled 2022-09-20: qty 3, 21d supply, fill #0

## 2022-09-20 MED ORDER — CARVEDILOL 3.125 MG PO TABS
3.1250 mg | ORAL_TABLET | Freq: Two times a day (BID) | ORAL | 5 refills | Status: DC
  Filled 2022-09-20: qty 60, 30d supply, fill #0

## 2022-09-20 MED ORDER — LOSARTAN POTASSIUM 25 MG PO TABS
25.0000 mg | ORAL_TABLET | Freq: Every day | ORAL | 5 refills | Status: DC
  Filled 2022-09-20: qty 30, 30d supply, fill #0

## 2022-09-20 MED ORDER — POTASSIUM CHLORIDE CRYS ER 20 MEQ PO TBCR
40.0000 meq | EXTENDED_RELEASE_TABLET | Freq: Once | ORAL | Status: AC
  Administered 2022-09-20: 40 meq via ORAL
  Filled 2022-09-20: qty 2

## 2022-09-20 MED ORDER — INSULIN PEN NEEDLE 32G X 4 MM MISC
0 refills | Status: AC
  Filled 2022-09-20: qty 100, 30d supply, fill #0

## 2022-09-20 NOTE — Research (Signed)
[facility] Informed Consent   Subject Name: Julie Hernandez  Subject met inclusion and exclusion criteria.  The informed consent form, study requirements and expectations were reviewed with the subject and questions and concerns were addressed prior to the signing of the consent form.  The subject verbalized understanding of the trail requirements.  The subject agreed to participate in the [facility] trial and signed the informed consent.  The informed consent was obtained prior to performance of any protocol-specific procedures for the subject.  A copy of the signed informed consent was given to the subject and a copy was placed in the subject's medical record.  Berneda Rose 09/19/2022, 2:51 PM  Lung scans performed

## 2022-09-20 NOTE — Inpatient Diabetes Management (Addendum)
Inpatient Diabetes Program Recommendations  AACE/ADA: New Consensus Statement on Inpatient Glycemic Control (2015)  Target Ranges:  Prepandial:   less than 140 mg/dL      Peak postprandial:   less than 180 mg/dL (1-2 hours)      Critically ill patients:  140 - 180 mg/dL   Lab Results  Component Value Date   GLUCAP 196 (H) 09/20/2022   HGBA1C 13.1 (H) 09/18/2022    Review of Glycemic Control  Latest Reference Range & Units 09/19/22 11:14 09/19/22 16:52 09/19/22 21:08 09/20/22 06:33  Glucose-Capillary 70 - 99 mg/dL 264 (H) 195 (H) 214 (H) 196 (H)  (H): Data is abnormally high Diabetes history: DM 2 Outpatient Diabetes medications: Jardiance 10 mg, Lantus 8 units daily Current orders for Inpatient glycemic control:  Novolog 0-15 units tid with meals and HS Semglee 8 units daily   Inpatient Diabetes Program Recommendations:     Consider increasing Semglee to 14 units daily.   Will plan to see patient again today.   Addendum: Spoke with patient to reinforce discussion yesterday.  Applied and reviewed Freestyle libre 3 with patient. Downloaded application to phone, reviewed risks and benefits, and how to care for device. Freestyle applied left upper arm.  Briefly reviewed importance of CHO of mindfulness and using Freestyle libre as a tool to help assist with portion sizes and food choices.  Encouraged consistent follow up with Dr Cruzita Lederer and inquire about GLP process. No further questions at this time.  Secure chat sent to provide to update Dc summary.  Thanks, Bronson Curb, MSN, RNC-OB Diabetes Coordinator [phone] (8a-5p)

## 2022-09-20 NOTE — Progress Notes (Addendum)
Pt has walked this morning, reviewed low na diet, and daily weights. Pt request I send CRPII to [facility].

## 2022-09-20 NOTE — Progress Notes (Signed)
RN went over d/c summary w/ pt. NT  removed PIVs. Belongings w/ pt. Pt has question for provider regarding insulin. Solostar pen not in TOC meds, NP adding it.   Pt now has all TOC meds. NT transporting pt to private vehicle where pt's friend will transport pt home.

## 2022-09-20 NOTE — Progress Notes (Addendum)
Advanced Heart Failure Rounding Note  PCP-Cardiologist: Glori Bickers, MD   Subjective:   10/30 - LHC /RHC 90% in-stent restenosis of mLAD and CTO mRCA with left-to-right collaterals. LCx stent widely patent. Elevated filling pressures CO 3.8 CI 2.  10/31- S/P PCI DES to mild LAD. Evening down of lasix held due to hypotension.   Feels good this morning, denies CP and SOB. Ambulated twice yesterday.  Objective:   Weight Range: 86.4 kg Body mass index is 34.84 kg/m.   Vital Signs:   Temp:  [97.8 F (36.6 C)-98.9 F (37.2 C)] 97.8 F (36.6 C) (11/02 0700) Pulse Rate:  [65-88] 70 (11/02 0716) Resp:  [13-18] 15 (11/02 0716) BP: (87-100)/(55-77) 93/55 (11/02 0716) SpO2:  [93 %-100 %] 99 % (11/02 0716) Weight:  [86.4 kg] 86.4 kg (11/02 0630)   Weight change: Filed Weights   09/18/22 1333 09/19/22 0625 09/20/22 0630  Weight: 86.4 kg 87 kg 86.4 kg    Intake/Output:   Intake/Output Summary (Last 24 hours) at 09/20/2022 0727 Last data filed at 09/20/2022 0630 Gross per 24 hour  Intake 960 ml  Output 1450 ml  Net -490 ml     Physical Exam  General:  well appearing.  No respiratory difficulty HEENT: normal Neck: supple. No JVD. Carotids 2+ bilat; no bruits. No lymphadenopathy or thyromegaly appreciated. Cor: PMI nondisplaced. Regular rate & rhythm. No rubs, gallops or murmurs. Lungs: clear Abdomen: soft, nontender, nondistended. No hepatosplenomegaly. No bruits or masses. Good bowel sounds. Extremities: no cyanosis, clubbing, rash, edema  Neuro: alert & oriented x 3, cranial nerves grossly intact. moves all 4 extremities w/o difficulty. Affect pleasant.   Telemetry  NSR 70s (Personally reviewed)   EKG  No new EKG to review Labs    CBC Recent Labs    09/18/22 0018 09/19/22 0026  WBC 6.6 7.0  HGB 11.7* 12.1  HCT 37.5 39.8  MCV 90.6 90.7  PLT 269 035   Basic Metabolic Panel Recent Labs    09/19/22 0026 09/20/22 0020  NA 140 139  K 4.0 3.3*  CL 103  102  CO2 28 25  GLUCOSE 164* 262*  BUN 22* 22*  CREATININE 0.97 0.96  CALCIUM 9.1 8.9  MG  --  1.9   Liver Function Tests No results for input(s): "AST", "ALT", "ALKPHOS", "BILITOT", "PROT", "ALBUMIN" in the last 72 hours. No results for input(s): "LIPASE", "AMYLASE" in the last 72 hours. Cardiac Enzymes No results for input(s): "CKTOTAL", "CKMB", "CKMBINDEX", "TROPONINI" in the last 72 hours.  BNP: BNP (last 3 results) No results for input(s): "BNP" in the last 8760 hours.  ProBNP (last 3 results) No results for input(s): "PROBNP" in the last 8760 hours.   D-Dimer No results for input(s): "DDIMER" in the last 72 hours. Hemoglobin A1C Recent Labs    09/18/22 0018  HGBA1C 13.1*   Fasting Lipid Panel No results for input(s): "CHOL", "HDL", "LDLCALC", "TRIG", "CHOLHDL", "LDLDIRECT" in the last 72 hours.  Thyroid Function Tests No results for input(s): "TSH", "T4TOTAL", "T3FREE", "THYROIDAB" in the last 72 hours.  Invalid input(s): "FREET3"  Other results:   Imaging    No results found.   Medications:     Scheduled Medications:  aspirin EC  81 mg Oral Daily   clopidogrel  75 mg Oral Daily   digoxin  0.125 mg Oral Daily   doxycycline  100 mg Oral BID   enoxaparin (LOVENOX) injection  40 mg Subcutaneous Q24H   escitalopram  10 mg Oral  Daily   ezetimibe  10 mg Oral Daily   insulin aspart  0-15 Units Subcutaneous TID WC   insulin aspart  0-5 Units Subcutaneous QHS   insulin glargine-yfgn  8 Units Subcutaneous QHS   losartan  25 mg Oral Daily   sodium chloride flush  3 mL Intravenous Q12H   spironolactone  25 mg Oral Daily    Infusions:  sodium chloride Stopped (09/17/22 1504)   sodium chloride      PRN Medications: sodium chloride, acetaminophen, nitroGLYCERIN, ondansetron (ZOFRAN) IV, ondansetron, sodium chloride flush    Patient Profile   51 y.o. woman with COPD, tobacco abuse, premature CAD s/p anterior MI, poorly controlled DM2 in setting of  poor compliance w/ diabetes regimen, statin intolerant, chronic back pain, and systolic HF due to iCM. EF ultimately improved after LAD PCI. Now admitted w/ NSTEMI due to occluded LAD stent and new CTO of RCA w/ L>>R collaterals, previously placed LCx stent widely patent. LVEF back down, 20%, RV mildly reduced. RHC w/ elevated R + L Filling pressures and low output.   Assessment/Plan  1. CAD/ NSTEMI  - prior anterior MI w/ h/o PCI to mLAD and mLCX - HS trop peak 2,501 - Echo w/ drop in EF, from 55% >> 20%, RV mildly reduced - Cath -90% in-stent restenosis of mLAD and CTO mRCA with left-to-right collaterals. LCx stent widely patent.  - S/P PCI DES mid LAD .  - Plan aspirin + clopidogrel at least 12 months but hopefully long term DAPT. - statin intolerant, on Repatha PTA    2. Acute Systolic Heart Failure - Previous history of  ICM following prior anterior MI, EF previously 25-30%, improved post LAD and LCx PCI - Echo 9/21 EF 60-65% - Echo 04/06/21 EF 50-55% mild anterior and apical HK - Echo this admit, EF down 20%, RV mildly reduced in setting of LAD and RCA infarcts, previously placed mLAD stent w/ 99% stenosis, CTO mRCA w/ L>R collaterals  - RHC w/ elevated R+ L filling pressures and low output, RA 23, PCWP 30, CI 2.0  - Volume status improving. On 40 mg QOD at home, does better with daily meds, will send home with 20 mg daily and instructions to take extra w/ 3lb weight gain or noticed edema - Continue spiro 25 mg daily   - restart carvedilol 3.125 BID - stop digoxin 0.125 mg  - Continue losartan 25 mg daily. Consider switching down the road.  - BP soft, denies dizziness - no SGLT2i w/ poorly controlled DM    3. Type 2DM - poorly controlled, in setting of poor compliance w/ regimen - previous Hgb A1c 13.3 (12/22) - A1c 10/31 13.1 - On SSI - Hold off on SGLT2i until better controlled. - Per DB coordinator, will try to get on Ozempic, pending PA   4. HLD w/ LDL Goal < 55 - statin  intolerant - on Repatha PTA + Zetia, reports compliance   - LDL 86 on admit  - needs f/u in lipid clinic, may need trial of very low dose statin as adjunct    5. H/o Tobacco Use - quit cigarettes, currently vapes   Diabetes coordinator and cardiac rehab consulted and saw yesterday.   Length of Stay: 3  Alma Merlene Morse AGACNP-BC  09/20/2022, 7:27 AM  Advanced Heart Failure Team Pager [phone] (M-F; 7a - 5p)  Please contact [facility] for night-coverage after hours (5p -7a ) and weekends on [email or web address]  Patient seen and examined with the  above-signed Engineer, mining. I personally reviewed laboratory data, imaging studies and relevant notes. I independently examined the patient and formulated the important aspects of the plan. I have edited the note to reflect any of my changes or salient points. I have personally discussed the plan with the patient and/or family.  Diuresed well with IV lasix. Feels good. No CP or SOB. Wants to go home  General:  Well appearing. No resp difficulty HEENT: normal Neck: supple. no JVD. Carotids 2+ bilat; no bruits. No lymphadenopathy or thryomegaly appreciated. Cor: PMI nondisplaced. Regular rate & rhythm. No rubs, gallops or murmurs. Lungs: clear Abdomen: soft, nontender, nondistended. No hepatosplenomegaly. No bruits or masses. Good bowel sounds. Extremities: no cyanosis, clubbing, rash, edema Neuro: alert & orientedx3, cranial nerves grossly intact. moves all 4 extremities w/o difficulty. Affect pleasant  Ok for d/c today on above meds. Refer to outpatient CR. Stressed need for better management of DM2 and abstinence from vaping.   Arvilla Meres, MD  11:54 AM

## 2022-09-20 NOTE — Progress Notes (Addendum)
Mobility Specialist Progress Note    09/20/22 0853  Mobility  Activity Ambulated independently in hallway  Level of Assistance Standby assist, set-up cues, supervision of patient - no hands on  Assistive Device None  Distance Ambulated (ft) 360 ft  Activity Response Tolerated well  Mobility Referral Yes  $Mobility charge 1 Mobility   Pre-Mobility: 72 HR, 102/59 (69) BP, 95% SpO2 During Mobility: 112 HR, >/=92% SpO2 Post-Mobility: 90 HR, 109/75 (86) BP, 94% SpO2  Pt received in bed and agreeable. No complaints on walk. Encouraged pursed lip breathing. Tolerated on RA. Returned to bed with call bell in reach. Pt requested to be placed back on 2LO2 in room. Once seated EOB, pt stated she felt a little pressure in her chest but that it was not painful. RN notified.   Hildred Alamin Mobility Specialist  Secure Chat Only

## 2022-09-20 NOTE — TOC Transition Note (Signed)
Transition of Care [facility]) - CM/SW Discharge Note   Patient Details  Name: Julie Hernandez MRN: [id number] Date of Birth: May 24, 1971  [facility] of [facility] CM/SW Contact:  Zenon Mayo, RN Phone Number: 09/20/2022, 10:49 AM   Clinical Narrative:    Patient is for dc today, TOC to do meds, he has no needs.          Patient Goals and CMS Choice        Discharge Placement                       Discharge Plan and Services                                     Social Determinants of Health (SDOH) Interventions     Readmission Risk Interventions     No data to display

## 2022-09-20 NOTE — Care Management Important Message (Signed)
Important Message  Patient Details  Name: Julie Hernandez MRN: [id number] Date of Birth: 04-29-1971   Medicare Important Message Given:  Yes     Orbie Pyo 09/20/2022, 2:03 PM

## 2022-09-20 NOTE — Progress Notes (Signed)
Nurse requested Mobility Specialist to perform oxygen saturation test with pt which includes removing pt from oxygen both at rest and while ambulating.  Below are the results from that testing.     Patient Saturations on Room Air at Rest = spO2 95%  Patient Saturations on Room Air while Ambulating = sp02 >/=92% .    Reported results to nurse.

## 2022-10-03 ENCOUNTER — Other Ambulatory Visit: Payer: Self-pay | Admitting: Internal Medicine

## 2022-10-03 DIAGNOSIS — E1165 Type 2 diabetes mellitus with hyperglycemia: Secondary | ICD-10-CM

## 2022-10-04 ENCOUNTER — Other Ambulatory Visit (HOSPITAL_COMMUNITY): Payer: Self-pay

## 2022-10-09 ENCOUNTER — Ambulatory Visit (HOSPITAL_COMMUNITY)
Admit: 2022-10-09 | Discharge: 2022-10-09 | Disposition: A | Discharge status: Home / Self Care | Payer: PPO | Attending: Family Medicine | Admitting: Family Medicine

## 2022-10-09 ENCOUNTER — Telehealth (HOSPITAL_COMMUNITY): Payer: Self-pay | Admitting: Surgery

## 2022-10-09 ENCOUNTER — Telehealth: Payer: Self-pay | Admitting: Pharmacist

## 2022-10-09 ENCOUNTER — Encounter (HOSPITAL_COMMUNITY): Payer: Self-pay

## 2022-10-09 VITALS — BP 108/76 | HR 81 | Ht 62.0 in | Wt 195.2 lb

## 2022-10-09 DIAGNOSIS — Z87891 Personal history of nicotine dependence: Secondary | ICD-10-CM

## 2022-10-09 DIAGNOSIS — E1165 Type 2 diabetes mellitus with hyperglycemia: Secondary | ICD-10-CM

## 2022-10-09 DIAGNOSIS — I110 Hypertensive heart disease with heart failure: Secondary | ICD-10-CM | POA: Diagnosis not present

## 2022-10-09 DIAGNOSIS — I25119 Atherosclerotic heart disease of native coronary artery with unspecified angina pectoris: Secondary | ICD-10-CM

## 2022-10-09 DIAGNOSIS — I252 Old myocardial infarction: Secondary | ICD-10-CM | POA: Diagnosis not present

## 2022-10-09 DIAGNOSIS — E782 Mixed hyperlipidemia: Secondary | ICD-10-CM

## 2022-10-09 DIAGNOSIS — Z955 Presence of coronary angioplasty implant and graft: Secondary | ICD-10-CM | POA: Diagnosis not present

## 2022-10-09 DIAGNOSIS — Z79899 Other long term (current) drug therapy: Secondary | ICD-10-CM | POA: Diagnosis not present

## 2022-10-09 DIAGNOSIS — I214 Non-ST elevation (NSTEMI) myocardial infarction: Secondary | ICD-10-CM

## 2022-10-09 DIAGNOSIS — E1159 Type 2 diabetes mellitus with other circulatory complications: Secondary | ICD-10-CM

## 2022-10-09 DIAGNOSIS — E785 Hyperlipidemia, unspecified: Secondary | ICD-10-CM | POA: Insufficient documentation

## 2022-10-09 DIAGNOSIS — Z7982 Long term (current) use of aspirin: Secondary | ICD-10-CM | POA: Insufficient documentation

## 2022-10-09 DIAGNOSIS — I25118 Atherosclerotic heart disease of native coronary artery with other forms of angina pectoris: Secondary | ICD-10-CM

## 2022-10-09 DIAGNOSIS — I5022 Chronic systolic (congestive) heart failure: Secondary | ICD-10-CM | POA: Diagnosis present

## 2022-10-09 DIAGNOSIS — Z7902 Long term (current) use of antithrombotics/antiplatelets: Secondary | ICD-10-CM | POA: Diagnosis not present

## 2022-10-09 DIAGNOSIS — Z7289 Other problems related to lifestyle: Secondary | ICD-10-CM

## 2022-10-09 DIAGNOSIS — J449 Chronic obstructive pulmonary disease, unspecified: Secondary | ICD-10-CM | POA: Insufficient documentation

## 2022-10-09 LAB — COMPREHENSIVE METABOLIC PANEL
ALT: 11 U/L (ref 0–44)
AST: 16 U/L (ref 15–41)
Albumin: 3.6 g/dL (ref 3.5–5.0)
Alkaline Phosphatase: 62 U/L (ref 38–126)
Anion gap: 10 (ref 5–15)
BUN: 22 mg/dL — ABNORMAL HIGH (ref 6–20)
CO2: 21 mmol/L — ABNORMAL LOW (ref 22–32)
Calcium: 9 mg/dL (ref 8.9–10.3)
Chloride: 111 mmol/L (ref 98–111)
Creatinine, Ser: 0.93 mg/dL (ref 0.44–1.00)
GFR, Estimated: >60 mL/min (ref >60)
Glucose, Bld: 144 mg/dL — ABNORMAL HIGH (ref 70–99)
Potassium: 4.3 mmol/L (ref 3.5–5.1)
Sodium: 142 mmol/L (ref 135–145)
Total Bilirubin: 0.2 mg/dL — ABNORMAL LOW (ref 0.3–1.2)
Total Protein: 6.3 g/dL — ABNORMAL LOW (ref 6.5–8.1)

## 2022-10-09 LAB — CBC
HCT: 39.9 % (ref 36.0–46.0)
Hemoglobin: 12.3 g/dL (ref 12.0–15.0)
MCH: 28 pg (ref 26.0–34.0)
MCHC: 30.8 g/dL (ref 30.0–36.0)
MCV: 90.9 fL (ref 80.0–100.0)
Platelets: 298 10*3/uL (ref 150–400)
RBC: 4.39 MIL/uL (ref 3.87–5.11)
RDW: 14.3 % (ref 11.5–15.5)
WBC: 6.6 10*3/uL (ref 4.0–10.5)
nRBC: 0 % (ref 0.0–0.2)

## 2022-10-09 LAB — BRAIN NATRIURETIC PEPTIDE: B Natriuretic Peptide: 619.9 pg/mL — ABNORMAL HIGH (ref 0.0–100.0)

## 2022-10-09 MED ORDER — REPATHA SURECLICK 140 MG/ML ~~LOC~~ SOAJ: 1.0000 mL | SUBCUTANEOUS | 3 refills | Status: AC

## 2022-10-09 MED ORDER — CLOPIDOGREL BISULFATE 75 MG PO TABS: 75.0000 mg | ORAL_TABLET | Freq: Every day | ORAL | 11 refills | Status: DC

## 2022-10-09 MED ORDER — FUROSEMIDE 20 MG PO TABS: 40.0000 mg | ORAL_TABLET | Freq: Every day | ORAL | 11 refills | Status: DC

## 2022-10-09 MED ORDER — ENTRESTO 24-26 MG PO TABS: 1.0000 | ORAL_TABLET | Freq: Two times a day (BID) | ORAL | 11 refills | Status: DC

## 2022-10-09 NOTE — Telephone Encounter (Signed)
-----   Message from Jacklynn Ganong, [place] sent at 10/09/2022  3:09 PM EST ----- BNP elevated. Keep lasix at 40 mg daily (do not decrease to 20 mg daily as discussed at visit). She has repeat labs arranged

## 2022-10-09 NOTE — Addendum Note (Signed)
Encounter addended by: Demetrius Charity, RN on: 10/09/2022 12:07 PM  Actions taken: Order list changed, Diagnosis association updated

## 2022-10-09 NOTE — Patient Instructions (Addendum)
Thank you for coming in today  Labs were done today, if any labs are abnormal the clinic will call you No news is good news  RESTART Clopidogrel 75 mg 1 tablet daily STOP Losartan START Entresto 24/26 mg 1 tablet twice daily  You have been referred to cardiac rehab they will contact you for further details  You have been referred to Endocrinology they will contact you for further details  You have been referred to the Lipid clinic to restart Repatha    Your physician recommends that you schedule a follow-up appointment in:  6 weeks in clinic  12 weeks with echocardiogram with Dr, Gala Romney  Your physician has requested that you have an echocardiogram. Echocardiography is a painless test that uses sound waves to create images of your heart. It provides your doctor with information about the size and shape of your heart and how well your heart's chambers and valves are working. This procedure takes approximately one hour. There are no restrictions for this procedure.   Your physician recommends that you return for lab work in:  10- 14 days BMET

## 2022-10-09 NOTE — Progress Notes (Signed)
Advanced [facility] Clinic Not  Date:  01/02/2021   ID:  Julie Hernandez, DOB April 24, 1971, MRN [id number]  Location: Home  Provider location: [facility] Type of Visit: Established patient  PCP:  Truett Mainland, FNP  HF Cardiologist: Dr. Haroldine Laws  Chief Complaint: Heart Failure follow-up   HPI: Julie Hernandez is a 51 y.o. woman with COPD/ongoing tobacco abuse, premature CAD s/p anterior MI, diet-controlled DM2, chronic back pain, and systolic HF due to [facility] referred for further evaluation of CAD and systolic HF.  Had anterior MI in 2010 taken emergently to [facility]. Stent was placed emergently. Post stenting she continued to have CP. Went to [facility] that same year and had  a second stent placed. Did well from a cardiac perspective until 3/21.   Cath 02/08/20 at [facility] for NSTEMI. 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 Was told she may need repeat stenting vs CABG. Referred here for second opinion.   We saw her in 5/21 with daily angina. Underwent MRI which showed EF 26% only minimal viability in anterior wall. Decision to proceed with PCI of LCX and LAD (ISR) over CABG. Had successful PCI of LCX and LAD with Dr. Burt Knack on 5/20.   Echo 9/21 EF 60-65%.  Echo 04/06/21 EF 50-55% mild anterior and apical HK  Admitted 10/23 with NSTEMI. Echo showed EF back down to 20% and RV mildly reduced. AHF consulted and underwent R/LHC which showed elevated right and left filling pressures and low CO;  occluded LAD stent and new CTO of RCA with L>>R collaterals. Underwent staged PCI with DES to mid LAD. Diuresed with IV lasix and eventual transition to GDMT, no SGLT2i with poorly controlled DM. Plan for ASA + Plavix at least 12 month, ideally long-term though. Discharged home, weight 190 lbs.  Today she returns for post  hospital HF follow up. Overall feeling fair. Had to take nitro 2x last week for CP with relief. Legs are weak and feel heavy. Has abdominal fullness. She has SOB with ADLs Denies palpitations, dizziness, edema, or PND/Orthopnea. Appetite ok. No fever or chills. Weight at home 190-195 pounds. Taking all medications.   Cardiac Studies - R/LHC (10/23): RA 23 mean, PA 48/35 mean 39, PCWP mean 30, CO/CI (Fick) 3.7/2 Severe 2v CAD with 90% in stent re-stenonsis of mid LAD and CTO of mid RCA with L>>R collaterals  - Echo (10/23): EF 20%, RV mildly down.  - Echo (5/22): EF 50-55%, mid anterior and apical HK  - Echo (9/21): EF 60-65%  - Zio (7/21) 1. Sinus rhythm - avg HR of 82 2. 13 Ventricular Tachycardia runs occurred, the run with the fastest interval lasting 12 beats with a max rate of 139 bpm (avg 110 bpm); the run with the fastest interval was also the longest 3. Very frequents PVCs (32.2%, [id number]), VE Couplets were rare (<1.0%, 2118),  4. Ventricular Bigeminy and Trigeminy were present. 5.. Multiple patient-triggered events associated  - cMRI (03/29/20) 1. Subendocardial late gadolinium enhancement consistent with prior infarct in the LAD territory. There is >50% transmural LGE suggesting nonviability in the basal to apical anterior wall and apex. There is <50% transmural LGE suggesting viability in the basal to mid anteroseptum and apical septum  2.  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 Normal LV  size with severe systolic dysfunction (EF 65%). Akinesis of basal to apical anterior/anteroseptal walls and apex   4.  Small RV size with normal systolic function (EF 46%)  Past Medical History:  Diagnosis Date   CHF (congestive heart failure) (HCC)    Coronary artery disease    COVID 12/03/2020   Diabetes mellitus without complication (HCC)    Hypertension    Current Outpatient Medications  Medication Sig Dispense Refill   aspirin EC 81 MG tablet Take 1 tablet (81 mg total)  by mouth daily. 30 tablet 11   blood glucose meter kit and supplies Dispense based on patient and insurance preference. Use up to four times daily as directed. (FOR ICD-10 E10.9, E11.9). 1 each 1   carvedilol (COREG) 3.125 MG tablet Take 1 tablet (3.125 mg total) by mouth 2 (two) times daily with a meal. 60 tablet 5   Continuous Blood Gluc Sensor (FREESTYLE LIBRE 3 SENSOR) MISC Place 1 sensor on the skin every 14 days. Use to check glucose continuously 2 each 0   escitalopram (LEXAPRO) 10 MG tablet Take 10 mg by mouth daily.     ezetimibe (ZETIA) 10 MG tablet Take 1 tablet (10 mg total) by mouth daily at 6pm. (Patient taking differently: Take 10 mg by mouth daily.) 30 tablet 11   famotidine (PEPCID) 40 MG tablet Take 40 mg by mouth daily as needed for heartburn.     furosemide (LASIX) 20 MG tablet Take 1 tablet (20 mg total) by mouth daily. Take 20 mg daily, Take extra 20 mg with 3lb overnight weight gain, new swelling, or 5lb weight gain in 1 week. 60 tablet 11   insulin glargine (LANTUS) 100 UNIT/ML Solostar Pen Inject 14 Units into the skin daily. Further refills by PCP 15 mL 0   Insulin Pen Needle 32G X 4 MM MISC Use with insulin pen 100 each 0   losartan (COZAAR) 25 MG tablet Take 1 tablet (25 mg total) by mouth daily. 30 tablet 5   metFORMIN (GLUCOPHAGE) 500 MG tablet Take 2 tablets (1,000 mg total) by mouth 2 (two) times daily with a meal. 360 tablet 3   nitroGLYCERIN (NITROSTAT) 0.4 MG SL tablet PLACE 1 TABLET UNDER THE TONGUE AND ALLOW TO DISSOLVE SLOWLY, DO NOT CHEW OR SWALLOW. YOU MAY USE ADDITIONAL TABLETS EVERY 5 MINUTES BUT NO MORE THAN 3 TABLETS. (Patient taking differently: Place 0.4 mg under the tongue every 5 (five) minutes as needed for chest pain.) 25 tablet 1   ondansetron (ZOFRAN ODT) 4 MG disintegrating tablet Take 1 tablet (4 mg total) by mouth every 8 (eight) hours as needed for nausea or vomiting. 10 tablet 0   promethazine-dextromethorphan (PROMETHAZINE-DM) 6.25-15 MG/5ML  syrup Take 5 mLs by mouth daily as needed for cough.     RABEprazole (ACIPHEX) 20 MG tablet Take 40 mg by mouth daily.     spironolactone (ALDACTONE) 25 MG tablet Take 1 tablet (25 mg total) by mouth daily. 30 tablet 5   albuterol (VENTOLIN HFA) 108 (90 Base) MCG/ACT inhaler Inhale 2 puffs into the lungs every 4 (four) hours as needed for wheezing or shortness of breath. (Patient not taking: Reported on 10/09/2022)     No current facility-administered medications for this encounter.   Allergies  Allergen Reactions   Esomeprazole Magnesium Anaphylaxis   Ciprofibrate Itching   Ciprofloxacin Itching   Gabapentin Other (See Comments)    Achy - myalgia   Lubiprostone Diarrhea   Statins     Myalgia -  flu like symptoms   Amlodipine Rash   Iron Rash and Swelling   Latex Rash and Swelling   Povidone Iodine Rash   Social History   Socioeconomic History   Marital status: Divorced    Spouse name: Not on file   Number of children: 2   Years of education: Not on file   Highest education level: Not on file  Occupational History   Occupation: On disability  Tobacco Use   Smoking status: Former    Types: Cigarettes    Quit date: 02/18/2020    Years since quitting: 2.6   Smokeless tobacco: Never  Vaping Use   Vaping Use: Some days  Substance and Sexual Activity   Alcohol use: Yes    Comment: SOCIAL   Drug use: Never   Sexual activity: Not on file  Other Topics Concern   Not on file  Social History Narrative   Not on file   Social Determinants of Health   Financial Resource Strain: Not on file  Food Insecurity: Not on file  Transportation Needs: Not on file  Physical Activity: Not on file  Stress: Not on file  Social Connections: Not on file  Intimate Partner Violence: Not on file   FHx: M died at 59 from MI and HF D had stroke No brother and sisters  BP 108/76   Pulse 81   Ht _0  (1.575 m)   Wt 88.5 kg (195 lb 3.2 oz)   SpO2 96%   BMI 35.70 kg/m   Wt Readings  from Last 3 Encounters:  10/09/22 88.5 kg (195 lb 3.2 oz)  09/20/22 86.4 kg (190 lb 7.6 oz)  10/19/21 83.5 kg (184 lb)   PHYSICAL EXAM General:  NAD. No resp difficulty, walked into clinic HEENT: Normal Neck: Supple. No JVD. Carotids 2+ bilat; no bruits. No lymphadenopathy or thryomegaly appreciated. Cor: PMI nondisplaced. Regular rate & rhythm. No rubs, gallops or murmurs. Lungs: Clear Abdomen: Soft, nontender, nondistended. No hepatosplenomegaly. No bruits or masses. Good bowel sounds. Extremities: No cyanosis, clubbing, rash, edema Neuro: Alert & oriented x 3, cranial nerves grossly intact. Moves all 4 extremities w/o difficulty. Affect pleasant.  ECG (personally reviewed): NSR, 79 bpm  ASSESSMENT & PLAN: 1. CAD/ NSTEMI  - prior anterior MI w/ h/o PCI to mLAD and mLCX - Echo w/ drop in EF, from 55% >> 20%, RV mildly reduced - Cath (10/21): 90% in-stent restenosis of mLAD and CTO mRCA with left-to-right collaterals. LCx stent widely patent.  - S/P PCI DES mid LAD. - Had 2 episodes of CP last week, relieved with SL Nitro. ECG OK today, may be related to volume. Discussed ED precautions. Consider long acting nitrate down the road if BP permits. - Restart Plavix 75 mg daily. - Plan aspirin + clopidogrel at least 12 months, but hopefully long term DAPT. - Needs to restart Repatha, refer back to [facility] - Refer to [place] at [facility].   2. Chronic Systolic Heart Failure - Previous history of ICM following prior anterior MI.  - EF previously 25-30%, improved post LAD and LCx PCI - Echo (9/21): EF 60-65% - Echo (04/06/21): EF 50-55% mild anterior and apical HK - Echo (10/23): EF down 20%, RV mildly reduced in setting of LAD and RCA infarcts, previously placed mLAD stent w/ 99% stenosis, CTO mRCA w/ L>R collaterals  - RHC (10/23):  w/ elevated R+ L filling pressures and low output, RA 23, PCWP 30, CI 2.0  - NYHA III today, I  suspect lung issues and physical deconditioning playing some  role. Volume looks OK but weight up a little. - Stop losartan. - Start Entresto 24/26 mg bid. - Decrease lasix to 20 mg daily. - Continue spiro 25 mg daily   - Continue carvedilol 3.125 mg bid.  - no SGLT2i w/ poorly controlled DM  - Labs today. Repeat BMET in 10-14 days. - Repeat echo in 3 months.   3. Type 2DM - poorly controlled, in setting of poor compliance w/ regimen - Hgb A1c 13.1 (11/23) - No SGLT2i until better controlled. - Ozempic pending PA - Refer to Endocrinology, has seen Dr. Cruzita Lederer in the past.   4. HLD w/ LDL Goal < 55 - Statin intolerant. - Previously on Repatha, has not taken in awhile per patient report. - LDL 86 on admit  - May need trial of very low dose statin as adjunct  - Continue Zetia. - Restart Repatha, per Pharmacy.   5. H/o Tobacco Use - Quit cigarettes, currently vapes  - Discussed cessation. - Probably needs PFTs, defer to PCP.  Follow up in 6 weeks with APP and 12 weeks with Dr. Haroldine Laws + echo.  Allena Katz, FNP-BC 10/09/22

## 2022-10-09 NOTE — Telephone Encounter (Signed)
PA for Repatha submitted.  Key: [id number].  Response: Message from Plan Prior Authorization duplicate/approved.  Rx sent to pharmacy.

## 2022-10-09 NOTE — Telephone Encounter (Signed)
I called patient to review results and recommendations pre provider.  She is aware and agreeable. [facility] medlist updated.

## 2022-10-10 NOTE — Addendum Note (Signed)
Encounter addended by: Noralee Space, RN on: 10/10/2022 2:19 PM  Actions taken: Clinical Note Signed

## 2022-10-10 NOTE — Progress Notes (Signed)
Referral form for Pulm rehab at [facility] completed, signed by Dr Gala Romney, and faxed along with records

## 2022-10-19 ENCOUNTER — Other Ambulatory Visit (HOSPITAL_COMMUNITY): Payer: Self-pay | Admitting: *Deleted

## 2022-10-19 MED ORDER — ASPIRIN 81 MG PO TBEC: 81.0000 mg | DELAYED_RELEASE_TABLET | Freq: Every day | ORAL | 11 refills | Status: DC

## 2022-10-19 MED ORDER — CARVEDILOL 3.125 MG PO TABS: 3.1250 mg | ORAL_TABLET | Freq: Two times a day (BID) | ORAL | 5 refills | Status: DC

## 2022-10-23 ENCOUNTER — Ambulatory Visit (HOSPITAL_COMMUNITY)
Admission: RE | Admit: 2022-10-23 | Discharge: 2022-10-23 | Disposition: A | Discharge status: Home / Self Care | Payer: PPO | Source: Ambulatory Visit | Attending: Cardiology | Admitting: Cardiology

## 2022-10-23 DIAGNOSIS — I5021 Acute systolic (congestive) heart failure: Secondary | ICD-10-CM | POA: Diagnosis present

## 2022-10-23 LAB — BASIC METABOLIC PANEL
Anion gap: 11 (ref 5–15)
BUN: 17 mg/dL (ref 6–20)
CO2: 22 mmol/L (ref 22–32)
Calcium: 9.2 mg/dL (ref 8.9–10.3)
Chloride: 108 mmol/L (ref 98–111)
Creatinine, Ser: 0.9 mg/dL (ref 0.44–1.00)
GFR, Estimated: >60 mL/min (ref >60)
Glucose, Bld: 130 mg/dL — ABNORMAL HIGH (ref 70–99)
Potassium: 4.3 mmol/L (ref 3.5–5.1)
Sodium: 141 mmol/L (ref 135–145)

## 2022-10-31 ENCOUNTER — Encounter: Payer: Self-pay | Admitting: Dietician

## 2022-10-31 ENCOUNTER — Encounter: Payer: PPO | Attending: Family Medicine | Admitting: Dietician

## 2022-10-31 VITALS — Ht 62.0 in

## 2022-10-31 DIAGNOSIS — E1159 Type 2 diabetes mellitus with other circulatory complications: Secondary | ICD-10-CM | POA: Insufficient documentation

## 2022-10-31 DIAGNOSIS — E1165 Type 2 diabetes mellitus with hyperglycemia: Secondary | ICD-10-CM | POA: Diagnosis present

## 2022-10-31 NOTE — Progress Notes (Signed)
Diabetes Self-Management Education  Visit Type: First/Initial  Appt. Start Time: 1400 Appt. End Time: 1504  10/31/2022  [name], identified by name and date of birth, is a 51 y.o. female with a diagnosis of Diabetes: Type 2.   ASSESSMENT  Primary concern: Pt states she feels defeated/burned out because she feels that she is unsure of what she can eat to control her blood sugar.   History includes: type 2 diabetes, CHF, HTN, HLD.  Labs noted: 09/18/22 A1c 13.1% Medications include: metformin, insulin glargine Supplements: sublingual vitamin B12, vitamin D3 CGM: Freestyle Libre 3, 94% time in range Wt: 188lbs, pt report  Pt states she had heart attack at the end of October and her A1c was very high. She states she felt extremely tired.   Pt got a CGM when she was hospitalized in October. Pt wants to learn about how to better understand her CGM readings.   Pt started exercise last week for cardio rehab. Pt goes 3x/wk for 30 minutes. Pt states she is able to walk on other days but still feels fatigued.   Pt states she is not a breakfast person. Pt wakes up at 8am and does not eat lunch until 1pm.   Pt is currently on a fluid restriction of daily. Pt drinks water throughout the day and states she has 1-2 diet pepsis daily.  Pt reports sometimes she will just have a bowl of cooked cabbage or turnip greens for dinner because she does not want her blood glucose to spike.   Height 5\' 2"  (1.575 m). Body mass index is 35.7 kg/m.   Diabetes Self-Management Education - 10/31/22 1404       Visit Information   Visit Type First/Initial      Initial Visit   Diabetes Type Type 2    Date Diagnosed 2011    Are you currently following a meal plan? No    Are you taking your medications as prescribed? Yes      Health Coping   How would you rate your overall health? Fair      Psychosocial Assessment   Patient Belief/Attitude about Diabetes Defeat/Burnout    What is the  hardest part about your diabetes right now, causing you the most concern, or is the most worrisome to you about your diabetes?   Making healty food and beverage choices    Self-care barriers None    Self-management support Doctor's office    Other persons present Patient    Patient Concerns Nutrition/Meal planning    Special Needs None    Preferred Learning Style No preference indicated    Learning Readiness Ready    How often do you need to have someone help you when you read instructions, pamphlets, or other written materials from your doctor or pharmacy? 1 - Never    What is the last grade level you completed in school? college      Pre-Education Assessment   Patient understands the diabetes disease and treatment process. Needs Instruction    Patient understands incorporating nutritional management into lifestyle. Needs Instruction    Patient undertands incorporating physical activity into lifestyle. Needs Instruction    Patient understands using medications safely. Needs Instruction    Patient understands monitoring blood glucose, interpreting and using results Needs Instruction    Patient understands prevention, detection, and treatment of acute complications. Needs Instruction    Patient understands prevention, detection, and treatment of chronic complications. Needs Instruction    Patient understands how to  develop strategies to address psychosocial issues. Needs Instruction    Patient understands how to develop strategies to promote health/change behavior. Needs Instruction      Complications   Last HgB A1C per patient/outside source 13.1 %    How often do you check your blood sugar? > 4 times/day    Fasting Blood glucose range (mg/dL) 53-646;803-212    Postprandial Blood glucose range (mg/dL) 248-250;037-048    Have you had a dilated eye exam in the past 12 months? No    Have you had a dental exam in the past 12 months? Yes    Are you checking your feet? Yes    How many days  per week are you checking your feet? 7      Dietary Intake   Breakfast 1/2 of a protein shake    Snack (morning) none    Lunch chickfila: salad with grilled chicken with apple OR chicken wrap with fruit cup OR leftovers    Snack (afternoon) nuts    Dinner turnip greens OR cabbage OR salad with chicken OR steak    Snack (evening) SF candy    Beverage(s) water, diet pepsi 2x/day      Activity / Exercise   Activity / Exercise Type Light (walking / raking leaves)    How many days per week do you exercise? 3    How many minutes per day do you exercise? 30    Total minutes per week of exercise 90      Patient Education   Previous Diabetes Education No    Disease Pathophysiology Definition of diabetes, type 1 and 2, and the diagnosis of diabetes;Factors that contribute to the development of diabetes;Explored patient's options for treatment of their diabetes    Healthy Eating Food label reading, portion sizes and measuring food.;Role of diet in the treatment of diabetes and the relationship between the three main macronutrients and blood glucose level;Plate Method;Carbohydrate counting;Reviewed blood glucose goals for pre and post meals and how to evaluate the patients' food intake on their blood glucose level.;Meal timing in regards to the patients' current diabetes medication.;Information on hints to eating out and maintain blood glucose control.;Meal options for control of blood glucose level and chronic complications.    Being Active Role of exercise on diabetes management, blood pressure control and cardiac health.;Identified with patient nutritional and/or medication changes necessary with exercise.;Helped patient identify appropriate exercises in relation to his/her diabetes, diabetes complications and other health issue.    Medications Taught/reviewed insulin/injectables, injection, site rotation, insulin/injectables storage and needle disposal.;Reviewed patients medication for diabetes,  action, purpose, timing of dose and side effects.;Reviewed medication adjustment guidelines for hyperglycemia and sick days.    Monitoring Identified appropriate SMBG and/or A1C goals.;Daily foot exams;Yearly dilated eye exam    Acute complications Taught prevention, symptoms, and  treatment of hypoglycemia - the 15 rule.;Discussed and identified patients' prevention, symptoms, and treatment of hyperglycemia.;Educated on sick day management    Chronic complications Relationship between chronic complications and blood glucose control;Assessed and discussed foot care and prevention of foot problems;Lipid levels, blood glucose control and heart disease;Identified and discussed with patient  current chronic complications;Dental care;Reviewed with patient heart disease, higher risk of, and prevention    Diabetes Stress and Support Identified and addressed patients feelings and concerns about diabetes;Worked with patient to identify barriers to care and solutions;Role of stress on diabetes    Lifestyle and Health Coping Lifestyle issues that need to be addressed for better diabetes care  Individualized Goals (developed by patient)   Nutrition General guidelines for healthy choices and portions discussed    Physical Activity Exercise 3-5 times per week;30 minutes per day    Medications take my medication as prescribed    Monitoring  Consistenly use CGM    Problem Solving Eating Pattern    Reducing Risk examine blood glucose patterns;do foot checks daily;treat hypoglycemia with 15 grams of carbs if blood glucose less than 70mg /dL    Health Coping Ask for help with psychological, social, or emotional issues      Post-Education Assessment   Patient understands the diabetes disease and treatment process. Comprehends key points    Patient understands incorporating nutritional management into lifestyle. Comprehends key points    Patient undertands incorporating physical activity into lifestyle. Comprehends  key points    Patient understands using medications safely. Demonstrates understanding / competency    Patient understands monitoring blood glucose, interpreting and using results Comprehends key points    Patient understands prevention, detection, and treatment of acute complications. Comprehends key points    Patient understands prevention, detection, and treatment of chronic complications. Comprehends key points    Patient understands how to develop strategies to address psychosocial issues. Comprehends key points    Patient understands how to develop strategies to promote health/change behavior. Comprehends key points      Outcomes   Expected Outcomes Demonstrated interest in learning. Expect positive outcomes    Future DMSE 2 months    Program Status Not Completed             Individualized Plan for Diabetes Self-Management Training:   Learning Objective:  Patient will have a greater understanding of diabetes self-management. Patient education plan is to attend individual and/or group sessions per assessed needs and concerns.   Plan:   Patient Instructions  Aim for 150 minutes of physical activity weekly. Continue going to cardiac rehab. Goal: Walk for 20 minutes 2x/wk on your off days.   Aim to eat within 1-2 hours of waking up and every 3-5 hours following.  Goal: eat breakfast 3 days a week. Aim for a protein + carbohydrate. Examples: protein shake and fruit, boiled egg on avocado whole wheat toast  Goal: aim to make 1/2 of your plate vegetables, 1/4 protein, and 1/4 carb at least 1x/day   Expected Outcomes:  Demonstrated interest in learning. Expect positive outcomes  Education material provided: ADA - How to Thrive: A Guide for Your Journey with Diabetes, My Plate, and Snack sheet  If problems or questions, patient to contact team via:  Phone  Future DSME appointment: 2 months

## 2022-10-31 NOTE — Patient Instructions (Addendum)
Aim for 150 minutes of physical activity weekly. Continue going to cardiac rehab. Goal: Walk for 20 minutes 2x/wk on your off days.    Aim to eat within 1-2 hours of waking up and every 3-5 hours following.  Goal: eat breakfast 3 days a week. Aim for a protein + carbohydrate. Examples: protein shake and fruit, boiled egg on avocado whole wheat toast   Goal: aim to make 1/2 of your plate vegetables, 1/4 protein, and 1/4 carb at least 1x/day 

## 2022-11-06 DIAGNOSIS — G8929 Other chronic pain: Secondary | ICD-10-CM | POA: Insufficient documentation

## 2022-11-07 NOTE — Progress Notes (Incomplete)
Advanced Heart Failure Clinic Not  Date:  01/02/2021   ID:  [name], DOB April 24, 1971, MRN [id number]  Location: Home  Provider location:  Advanced Heart Failure Clinic Type of Visit: Established patient  PCP:  Truett Mainland, FNP  HF Cardiologist: Dr. Haroldine Laws  Chief Complaint: Heart Failure follow-up   HPI: Icesis is a 51 y.o. woman with COPD/ongoing tobacco abuse, premature CAD s/p anterior MI, diet-controlled DM2, chronic back pain, and systolic HF due to [facility] referred for further evaluation of CAD and systolic HF.  Had anterior MI in 2010 taken emergently to [facility]. Stent was placed emergently. Post stenting she continued to have CP. Went to [facility] that same year and had  a second stent placed. Did well from a cardiac perspective until 3/21.   Cath 02/08/20 at [facility] for NSTEMI. 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 Was told she may need repeat stenting vs CABG. Referred here for second opinion.   We saw her in 5/21 with daily angina. Underwent MRI which showed EF 26% only minimal viability in anterior wall. Decision to proceed with PCI of LCX and LAD (ISR) over CABG. Had successful PCI of LCX and LAD with Dr. Burt Knack on 5/20.   Echo 9/21 EF 60-65%.  Echo 04/06/21 EF 50-55% mild anterior and apical HK  Admitted 10/23 with NSTEMI. Echo showed EF back down to 20% and RV mildly reduced. AHF consulted and underwent R/LHC which showed elevated right and left filling pressures and low CO;  occluded LAD stent and new CTO of RCA with L>>R collaterals. Underwent staged PCI with DES to mid LAD. Diuresed with IV lasix and eventual transition to GDMT, no SGLT2i with poorly controlled DM. Plan for ASA + Plavix at least 12 month, ideally long-term though. Discharged home, weight 190 lbs.  Today she returns for post  hospital HF follow up. Overall feeling fair. Had to take nitro 2x last week for CP with relief. Legs are weak and feel heavy. Has abdominal fullness. She has SOB with ADLs Denies palpitations, dizziness, edema, or PND/Orthopnea. Appetite ok. No fever or chills. Weight at home 190-195 pounds. Taking all medications.   Cardiac Studies - R/LHC (10/23): RA 23 mean, PA 48/35 mean 39, PCWP mean 30, CO/CI (Fick) 3.7/2 Severe 2v CAD with 90% in stent re-stenonsis of mid LAD and CTO of mid RCA with L>>R collaterals  - Echo (10/23): EF 20%, RV mildly down.  - Echo (5/22): EF 50-55%, mid anterior and apical HK  - Echo (9/21): EF 60-65%  - Zio (7/21) 1. Sinus rhythm - avg HR of 82 2. 13 Ventricular Tachycardia runs occurred, the run with the fastest interval lasting 12 beats with a max rate of 139 bpm (avg 110 bpm); the run with the fastest interval was also the longest 3. Very frequents PVCs (32.2%, [id number]), VE Couplets were rare (<1.0%, 2118),  4. Ventricular Bigeminy and Trigeminy were present. 5.. Multiple patient-triggered events associated  - cMRI (03/29/20) 1. Subendocardial late gadolinium enhancement consistent with prior infarct in the LAD territory. There is >50% transmural LGE suggesting nonviability in the basal to apical anterior wall and apex. There is <50% transmural LGE suggesting viability in the basal to mid anteroseptum and apical septum  2.  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 Normal LV  size with severe systolic dysfunction (EF 98%). Akinesis of basal to apical anterior/anteroseptal walls and apex   4.  Small RV size with normal systolic function (EF 92%)  Past Medical History:  Diagnosis Date   CHF (congestive heart failure) (HCC)    Coronary artery disease    COVID 12/03/2020   Diabetes mellitus without complication (HCC)    Hypertension    Current Outpatient Medications  Medication Sig Dispense Refill   albuterol (VENTOLIN HFA) 108 (90 Base) MCG/ACT  inhaler Inhale 2 puffs into the lungs every 4 (four) hours as needed for wheezing or shortness of breath. (Patient not taking: Reported on 10/09/2022)     aspirin EC 81 MG tablet Take 1 tablet (81 mg total) by mouth daily. 30 tablet 11   blood glucose meter kit and supplies Dispense based on patient and insurance preference. Use up to four times daily as directed. (FOR ICD-10 E10.9, E11.9). 1 each 1   carvedilol (COREG) 3.125 MG tablet Take 1 tablet (3.125 mg total) by mouth 2 (two) times daily with a meal. 60 tablet 5   clopidogrel (PLAVIX) 75 MG tablet Take 1 tablet (75 mg total) by mouth daily. 30 tablet 11   escitalopram (LEXAPRO) 10 MG tablet Take 10 mg by mouth daily.     Evolocumab (REPATHA SURECLICK) 119 MG/ML SOAJ Inject 140 mg into the skin every 14 (fourteen) days. 6 mL 3   ezetimibe (ZETIA) 10 MG tablet Take 1 tablet (10 mg total) by mouth daily at 6pm. (Patient taking differently: Take 10 mg by mouth daily.) 30 tablet 11   famotidine (PEPCID) 40 MG tablet Take 40 mg by mouth daily as needed for heartburn.     furosemide (LASIX) 20 MG tablet Take 2 tablets (40 mg total) by mouth daily. Take 20 mg daily, Take extra 20 mg with 3lb overnight weight gain, new swelling, or 5lb weight gain in 1 week. 60 tablet 11   insulin glargine (LANTUS) 100 UNIT/ML Solostar Pen Inject 14 Units into the skin daily. Further refills by PCP 15 mL 0   Insulin Pen Needle 32G X 4 MM MISC Use with insulin pen 100 each 0   metFORMIN (GLUCOPHAGE) 500 MG tablet Take 2 tablets (1,000 mg total) by mouth 2 (two) times daily with a meal. 360 tablet 3   nitroGLYCERIN (NITROSTAT) 0.4 MG SL tablet PLACE 1 TABLET UNDER THE TONGUE AND ALLOW TO DISSOLVE SLOWLY, DO NOT CHEW OR SWALLOW. YOU MAY USE ADDITIONAL TABLETS EVERY 5 MINUTES BUT NO MORE THAN 3 TABLETS. (Patient taking differently: Place 0.4 mg under the tongue every 5 (five) minutes as needed for chest pain.) 25 tablet 1   ondansetron (ZOFRAN ODT) 4 MG disintegrating tablet  Take 1 tablet (4 mg total) by mouth every 8 (eight) hours as needed for nausea or vomiting. 10 tablet 0   promethazine-dextromethorphan (PROMETHAZINE-DM) 6.25-15 MG/5ML syrup Take 5 mLs by mouth daily as needed for cough.     RABEprazole (ACIPHEX) 20 MG tablet Take 40 mg by mouth daily.     sacubitril-valsartan (ENTRESTO) 24-26 MG Take 1 tablet by mouth 2 (two) times daily. 60 tablet 11   spironolactone (ALDACTONE) 25 MG tablet Take 1 tablet (25 mg total) by mouth daily. 30 tablet 5   No current facility-administered medications for this visit.   Allergies  Allergen Reactions   Esomeprazole Magnesium Anaphylaxis   Ciprofibrate Itching   Ciprofloxacin Itching   Gabapentin Other (See Comments)    Achy - myalgia  Lubiprostone Diarrhea   Statins     Myalgia - flu like symptoms   Amlodipine Rash   Iron Rash and Swelling   Latex Rash and Swelling   Povidone Iodine Rash   Social History   Socioeconomic History   Marital status: Divorced    Spouse name: Not on file   Number of children: 2   Years of education: Not on file   Highest education level: Not on file  Occupational History   Occupation: On disability  Tobacco Use   Smoking status: Former    Types: Cigarettes    Quit date: 02/18/2020    Years since quitting: 2.7   Smokeless tobacco: Never  Vaping Use   Vaping Use: Some days  Substance and Sexual Activity   Alcohol use: Yes    Comment: SOCIAL   Drug use: Never   Sexual activity: Not on file  Other Topics Concern   Not on file  Social History Narrative   Not on file   Social Determinants of Health   Financial Resource Strain: Not on file  Food Insecurity: Not on file  Transportation Needs: Not on file  Physical Activity: Not on file  Stress: Not on file  Social Connections: Not on file  Intimate Partner Violence: Not on file   FHx: M died at 26 from MI and HF D had stroke No brother and sisters  There were no vitals taken for this visit.  Wt Readings  from Last 3 Encounters:  10/09/22 88.5 kg (195 lb 3.2 oz)  09/20/22 86.4 kg (190 lb 7.6 oz)  10/19/21 83.5 kg (184 lb)   PHYSICAL EXAM General:  NAD. No resp difficulty, walked into clinic HEENT: Normal Neck: Supple. No JVD. Carotids 2+ bilat; no bruits. No lymphadenopathy or thryomegaly appreciated. Cor: PMI nondisplaced. Regular rate & rhythm. No rubs, gallops or murmurs. Lungs: Clear Abdomen: Soft, nontender, nondistended. No hepatosplenomegaly. No bruits or masses. Good bowel sounds. Extremities: No cyanosis, clubbing, rash, edema Neuro: Alert & oriented x 3, cranial nerves grossly intact. Moves all 4 extremities w/o difficulty. Affect pleasant.  ECG (personally reviewed): NSR, 79 bpm  ASSESSMENT & PLAN: 1. CAD/ NSTEMI  - prior anterior MI w/ h/o PCI to mLAD and mLCX - Echo w/ drop in EF, from 55% >> 20%, RV mildly reduced - Cath (10/21): 90% in-stent restenosis of mLAD and CTO mRCA with left-to-right collaterals. LCx stent widely patent.  - S/P PCI DES mid LAD. - Had 2 episodes of CP last week, relieved with SL Nitro. ECG OK today, may be related to volume. Discussed ED precautions. Consider long acting nitrate down the road if BP permits. - Restart Plavix 75 mg daily. - Plan aspirin + clopidogrel at least 12 months, but hopefully long term DAPT. - Needs to restart Repatha, refer back to [facility] - Refer to [place] at [facility].   2. Chronic Systolic Heart Failure - Previous history of ICM following prior anterior MI.  - EF previously 25-30%, improved post LAD and LCx PCI - Echo (9/21): EF 60-65% - Echo (04/06/21): EF 50-55% mild anterior and apical HK - Echo (10/23): EF down 20%, RV mildly reduced in setting of LAD and RCA infarcts, previously placed mLAD stent w/ 99% stenosis, CTO mRCA w/ L>R collaterals  - RHC (10/23):  w/ elevated R+ L filling pressures and low output, RA 23, PCWP 30, CI 2.0  - NYHA III today, I suspect lung issues and physical deconditioning playing some  role. Volume looks OK  but weight up a little. - Stop losartan. - Start Entresto 24/26 mg bid. - Decrease lasix to 20 mg daily. - Continue spiro 25 mg daily   - Continue carvedilol 3.125 mg bid.  - no SGLT2i w/ poorly controlled DM  - Labs today. Repeat BMET in 10-14 days. - Repeat echo in 3 months.   3. Type 2DM - poorly controlled, in setting of poor compliance w/ regimen - Hgb A1c 13.1 (11/23) - No SGLT2i until better controlled. - Ozempic pending PA - Refer to Endocrinology, has seen Dr. Cruzita Lederer in the past.   4. HLD w/ LDL Goal < 55 - Statin intolerant. - Previously on Repatha, has not taken in awhile per patient report. - LDL 86 on admit  - May need trial of very low dose statin as adjunct  - Continue Zetia. - Restart Repatha, per Pharmacy.   5. H/o Tobacco Use - Quit cigarettes, currently vapes  - Discussed cessation. - Probably needs PFTs, defer to PCP.  Follow up in 6 weeks with APP and 12 weeks with Dr. Haroldine Laws + echo.  Allena Katz, FNP-BC 11/07/22

## 2022-11-13 ENCOUNTER — Other Ambulatory Visit (HOSPITAL_COMMUNITY): Payer: Self-pay | Admitting: Internal Medicine

## 2022-11-13 ENCOUNTER — Other Ambulatory Visit (HOSPITAL_COMMUNITY): Payer: Self-pay | Admitting: *Deleted

## 2022-11-13 MED ORDER — SPIRONOLACTONE 25 MG PO TABS: 25.0000 mg | ORAL_TABLET | Freq: Every day | ORAL | 5 refills | Status: DC

## 2022-11-20 ENCOUNTER — Encounter (HOSPITAL_COMMUNITY): Payer: PPO

## 2022-11-28 ENCOUNTER — Encounter (HOSPITAL_COMMUNITY): Payer: Self-pay

## 2022-11-28 ENCOUNTER — Encounter: Payer: PPO | Admitting: *Deleted

## 2022-11-28 ENCOUNTER — Telehealth: Payer: Self-pay | Admitting: Pharmacist

## 2022-11-28 ENCOUNTER — Other Ambulatory Visit (HOSPITAL_COMMUNITY): Payer: Self-pay | Admitting: *Deleted

## 2022-11-28 ENCOUNTER — Telehealth (HOSPITAL_COMMUNITY): Payer: Self-pay | Admitting: Cardiology

## 2022-11-28 ENCOUNTER — Ambulatory Visit (HOSPITAL_COMMUNITY)
Admission: RE | Admit: 2022-11-28 | Discharge: 2022-11-28 | Disposition: A | Discharge status: Home / Self Care | Payer: PPO | Source: Ambulatory Visit | Attending: Family Medicine | Admitting: Family Medicine

## 2022-11-28 VITALS — BP 100/68 | HR 79 | Ht 62.0 in | Wt 191.2 lb

## 2022-11-28 DIAGNOSIS — E118 Type 2 diabetes mellitus with unspecified complications: Secondary | ICD-10-CM | POA: Diagnosis not present

## 2022-11-28 DIAGNOSIS — I25118 Atherosclerotic heart disease of native coronary artery with other forms of angina pectoris: Secondary | ICD-10-CM

## 2022-11-28 DIAGNOSIS — R0789 Other chest pain: Secondary | ICD-10-CM | POA: Diagnosis not present

## 2022-11-28 DIAGNOSIS — Z7984 Long term (current) use of oral hypoglycemic drugs: Secondary | ICD-10-CM | POA: Diagnosis not present

## 2022-11-28 DIAGNOSIS — Z7982 Long term (current) use of aspirin: Secondary | ICD-10-CM | POA: Diagnosis not present

## 2022-11-28 DIAGNOSIS — I472 Ventricular tachycardia, unspecified: Secondary | ICD-10-CM | POA: Diagnosis not present

## 2022-11-28 DIAGNOSIS — E1165 Type 2 diabetes mellitus with hyperglycemia: Secondary | ICD-10-CM | POA: Diagnosis not present

## 2022-11-28 DIAGNOSIS — Z8249 Family history of ischemic heart disease and other diseases of the circulatory system: Secondary | ICD-10-CM | POA: Insufficient documentation

## 2022-11-28 DIAGNOSIS — Z8616 Personal history of COVID-19: Secondary | ICD-10-CM | POA: Diagnosis not present

## 2022-11-28 DIAGNOSIS — I493 Ventricular premature depolarization: Secondary | ICD-10-CM | POA: Insufficient documentation

## 2022-11-28 DIAGNOSIS — Z794 Long term (current) use of insulin: Secondary | ICD-10-CM | POA: Diagnosis not present

## 2022-11-28 DIAGNOSIS — I2510 Atherosclerotic heart disease of native coronary artery without angina pectoris: Secondary | ICD-10-CM | POA: Insufficient documentation

## 2022-11-28 DIAGNOSIS — E785 Hyperlipidemia, unspecified: Secondary | ICD-10-CM | POA: Diagnosis not present

## 2022-11-28 DIAGNOSIS — R008 Other abnormalities of heart beat: Secondary | ICD-10-CM | POA: Diagnosis not present

## 2022-11-28 DIAGNOSIS — Z955 Presence of coronary angioplasty implant and graft: Secondary | ICD-10-CM | POA: Diagnosis not present

## 2022-11-28 DIAGNOSIS — F17211 Nicotine dependence, cigarettes, in remission: Secondary | ICD-10-CM | POA: Insufficient documentation

## 2022-11-28 DIAGNOSIS — E669 Obesity, unspecified: Secondary | ICD-10-CM | POA: Diagnosis not present

## 2022-11-28 DIAGNOSIS — I110 Hypertensive heart disease with heart failure: Secondary | ICD-10-CM | POA: Insufficient documentation

## 2022-11-28 DIAGNOSIS — E782 Mixed hyperlipidemia: Secondary | ICD-10-CM | POA: Diagnosis not present

## 2022-11-28 DIAGNOSIS — I252 Old myocardial infarction: Secondary | ICD-10-CM | POA: Insufficient documentation

## 2022-11-28 DIAGNOSIS — I5022 Chronic systolic (congestive) heart failure: Secondary | ICD-10-CM | POA: Insufficient documentation

## 2022-11-28 DIAGNOSIS — F17290 Nicotine dependence, other tobacco product, uncomplicated: Secondary | ICD-10-CM | POA: Diagnosis not present

## 2022-11-28 DIAGNOSIS — Z7289 Other problems related to lifestyle: Secondary | ICD-10-CM

## 2022-11-28 DIAGNOSIS — Z006 Encounter for examination for normal comparison and control in clinical research program: Secondary | ICD-10-CM

## 2022-11-28 DIAGNOSIS — Z6834 Body mass index (BMI) 34.0-34.9, adult: Secondary | ICD-10-CM | POA: Insufficient documentation

## 2022-11-28 DIAGNOSIS — Z7902 Long term (current) use of antithrombotics/antiplatelets: Secondary | ICD-10-CM | POA: Diagnosis not present

## 2022-11-28 DIAGNOSIS — Z87891 Personal history of nicotine dependence: Secondary | ICD-10-CM

## 2022-11-28 DIAGNOSIS — Z79899 Other long term (current) drug therapy: Secondary | ICD-10-CM | POA: Diagnosis not present

## 2022-11-28 DIAGNOSIS — E1159 Type 2 diabetes mellitus with other circulatory complications: Secondary | ICD-10-CM

## 2022-11-28 LAB — BASIC METABOLIC PANEL
Anion gap: 12 (ref 5–15)
BUN: 18 mg/dL (ref 6–20)
CO2: 25 mmol/L (ref 22–32)
Calcium: 8.9 mg/dL (ref 8.9–10.3)
Chloride: 105 mmol/L (ref 98–111)
Creatinine, Ser: 0.8 mg/dL (ref 0.44–1.00)
GFR, Estimated: >60 mL/min (ref >60)
Glucose, Bld: 129 mg/dL — ABNORMAL HIGH (ref 70–99)
Potassium: 3.6 mmol/L (ref 3.5–5.1)
Sodium: 142 mmol/L (ref 135–145)

## 2022-11-28 LAB — BRAIN NATRIURETIC PEPTIDE: B Natriuretic Peptide: 677.1 pg/mL — ABNORMAL HIGH (ref 0.0–100.0)

## 2022-11-28 MED ORDER — FUROSEMIDE 20 MG PO TABS: ORAL_TABLET | ORAL | 11 refills | Status: DC

## 2022-11-28 MED ORDER — FUROSEMIDE 20 MG PO TABS: 60.0000 mg | ORAL_TABLET | Freq: Every day | ORAL | 3 refills | Status: DC

## 2022-11-28 MED ORDER — POTASSIUM CHLORIDE CRYS ER 20 MEQ PO TBCR: 20.0000 meq | EXTENDED_RELEASE_TABLET | Freq: Every day | ORAL | 3 refills | Status: DC

## 2022-11-28 NOTE — Progress Notes (Addendum)
Advanced Heart Failure Clinic Note  Date:  01/02/2021   ID:  Julie Hernandez, DOB 06-10-1971, MRN [id number]  Location: Home  Provider location: [place] [facility] Type of Visit: Established patient  PCP:  Julie Mainland, FNP  HF Cardiologist: Dr. Haroldine Hernandez  Chief Complaint: Heart Failure follow-up   HPI: Julie Hernandez is a 52 y.o. woman with COPD/ongoing tobacco abuse, premature CAD s/p anterior MI, diet-controlled DM2, chronic back pain, and systolic HF due to [facility] referred for further evaluation of CAD and systolic HF.  Had anterior MI in 2010 taken emergently to [facility]. Stent was placed emergently. Post stenting she continued to have CP. Went to [facility] that same year and had  a second stent placed. Did well from a cardiac perspective until 3/21.   Cath 02/08/20 at [facility] for NSTEMI. 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 Was told she may need repeat stenting vs CABG. Referred here for second opinion.   We saw her in 5/21 with daily angina. Underwent MRI which showed EF 26% only minimal viability in anterior wall. Decision to proceed with PCI of LCX and LAD (ISR) over CABG. Had successful PCI of LCX and LAD with Dr. Burt Hernandez on 5/20.   Echo 9/21 EF 60-65%.  Echo 04/06/21 EF 50-55% mild anterior and apical HK  Admitted 10/23 with NSTEMI. Echo showed EF back down to 20% and RV mildly reduced. AHF consulted and underwent R/LHC which showed elevated right and left filling pressures and low CO;  occluded LAD stent and new CTO of RCA with L>>R collaterals. Underwent staged PCI with DES to mid LAD. Diuresed with IV lasix and eventual transition to GDMT, no SGLT2i with poorly controlled DM. Plan for ASA + Plavix at least 12 month, ideally long-term though. Discharged home, weight 190 lbs.  Today she returns for HF follow  up. Feeling fair, recovering from GI bug from last week so feeling weak and fatigued. Blood sugars have been better, < 250. Has abdominal fullness and ache. Had some CP last week during GI illness, none since then. She is not SOB walking on flat ground. Denies palpitations, dizziness, edema, or PND/Orthopnea. Appetite ok. No fever or chills. Weight at home 190 pounds. Taking all medications. Doing CR 3 days/week. Frustrated that she is not losing weight. Still vaping, trying to quit.   Cardiac Studies - R/LHC (10/23): RA 23 mean, PA 48/35 mean 39, PCWP mean 30, CO/CI (Fick) 3.7/2 Severe 2v CAD with 90% in stent re-stenonsis of mid LAD and CTO of mid RCA with L>>R collaterals  - Echo (10/23): EF 20%, RV mildly down.  - Echo (5/22): EF 50-55%, mid anterior and apical HK  - Echo (9/21): EF 60-65%  - Zio (7/21) 1. Sinus rhythm - avg HR of 82 2. 13 Ventricular Tachycardia runs occurred, the run with the fastest interval lasting 12 beats with a max rate of 139 bpm (avg 110 bpm); the run with the fastest interval was also the longest 3. Very frequents PVCs (32.2%, [id number]), VE Couplets were rare (<1.0%, 2118),  4. Ventricular Bigeminy and Trigeminy were present. 5.. Multiple patient-triggered events associated  - cMRI (03/29/20) 1. Subendocardial late gadolinium enhancement consistent with prior infarct in the LAD territory. There is >50% transmural LGE suggesting nonviability in the basal to apical anterior wall and apex.  There is <50% transmural LGE suggesting viability in the basal to mid anteroseptum and apical septum  2.  LV apical thrombus measuring 45mm x 24mm   3. Normal LV size with severe systolic dysfunction (EF 34%). Akinesis of basal to apical anterior/anteroseptal walls and apex   4.  Small RV size with normal systolic function (EF 19%)  Past Medical History:  Diagnosis Date   CHF (congestive heart failure) (HCC)    Coronary artery disease    COVID 12/03/2020   Diabetes  mellitus without complication (HCC)    Hypertension    Current Outpatient Medications  Medication Sig Dispense Refill   albuterol (VENTOLIN HFA) 108 (90 Base) MCG/ACT inhaler Inhale 2 puffs into the lungs every 4 (four) hours as needed for wheezing or shortness of breath.     aspirin EC 81 MG tablet Take 1 tablet (81 mg total) by mouth daily. 30 tablet 11   blood glucose meter kit and supplies Dispense based on patient and insurance preference. Use up to four times daily as directed. (FOR ICD-10 E10.9, E11.9). 1 each 1   carvedilol (COREG) 3.125 MG tablet Take 1 tablet (3.125 mg total) by mouth 2 (two) times daily with a meal. 60 tablet 5   clopidogrel (PLAVIX) 75 MG tablet Take 1 tablet (75 mg total) by mouth daily. 30 tablet 11   escitalopram (LEXAPRO) 10 MG tablet Take 10 mg by mouth daily.     Evolocumab (REPATHA SURECLICK) 379 MG/ML SOAJ Inject 140 mg into the skin every 14 (fourteen) days. 6 mL 3   ezetimibe (ZETIA) 10 MG tablet Take 1 tablet (10 mg total) by mouth daily at 6pm. (Patient taking differently: Take 10 mg by mouth daily.) 30 tablet 11   famotidine (PEPCID) 40 MG tablet Take 40 mg by mouth daily as needed for heartburn.     furosemide (LASIX) 20 MG tablet Take 2 tablets (40 mg total) by mouth daily. Take 20 mg daily, Take extra 20 mg with 3lb overnight weight gain, new swelling, or 5lb weight gain in 1 week. 60 tablet 11   insulin glargine (LANTUS) 100 UNIT/ML Solostar Pen Inject 14 Units into the skin daily. Further refills by PCP 15 mL 0   Insulin Pen Needle 32G X 4 MM MISC Use with insulin pen 100 each 0   metFORMIN (GLUCOPHAGE) 500 MG tablet Take 2 tablets (1,000 mg total) by mouth 2 (two) times daily with a meal. 360 tablet 3   nitroGLYCERIN (NITROSTAT) 0.4 MG SL tablet PLACE 1 TABLET UNDER THE TONGUE AND ALLOW TO DISSOLVE SLOWLY, DO NOT CHEW OR SWALLOW. YOU MAY USE ADDITIONAL TABLETS EVERY 5 MINUTES BUT NO MORE THAN 3 TABLETS. 25 tablet 1   ondansetron (ZOFRAN ODT) 4 MG  disintegrating tablet Take 1 tablet (4 mg total) by mouth every 8 (eight) hours as needed for nausea or vomiting. 10 tablet 0   RABEprazole (ACIPHEX) 20 MG tablet Take 40 mg by mouth daily.     sacubitril-valsartan (ENTRESTO) 24-26 MG Take 1 tablet by mouth 2 (two) times daily. 60 tablet 11   spironolactone (ALDACTONE) 25 MG tablet Take 1 tablet (25 mg total) by mouth daily. 30 tablet 5   promethazine-dextromethorphan (PROMETHAZINE-DM) 6.25-15 MG/5ML syrup Take 5 mLs by mouth daily as needed for cough. (Patient not taking: Reported on 11/28/2022)     No current facility-administered medications for this encounter.   Allergies  Allergen Reactions   Esomeprazole Magnesium Anaphylaxis   Ciprofibrate Itching   Ciprofloxacin Itching  Gabapentin Other (See Comments)    Achy - myalgia   Lubiprostone Diarrhea   Statins     Myalgia - flu like symptoms   Amlodipine Rash   Iron Rash and Swelling   Latex Rash and Swelling   Povidone Iodine Rash   Social History   Socioeconomic History   Marital status: Divorced    Spouse name: Not on file   Number of children: 2   Years of education: Not on file   Highest education level: Not on file  Occupational History   Occupation: On disability  Tobacco Use   Smoking status: Former    Types: Cigarettes    Quit date: 02/18/2020    Years since quitting: 2.7   Smokeless tobacco: Never  Vaping Use   Vaping Use: Some days  Substance and Sexual Activity   Alcohol use: Yes    Comment: SOCIAL   Drug use: Never   Sexual activity: Not on file  Other Topics Concern   Not on file  Social History Narrative   Not on file   Social Determinants of Health   Financial Resource Strain: Not on file  Food Insecurity: Not on file  Transportation Needs: Not on file  Physical Activity: Not on file  Stress: Not on file  Social Connections: Not on file  Intimate Partner Violence: Not on file   FHx: M died at 84 from MI and HF D had stroke No brother and  sisters  BP 100/68   Pulse 79   Ht 5\' 2"  (1.575 m)   Wt 86.7 kg (191 lb 3.2 oz)   SpO2 97%   BMI 34.97 kg/m   Wt Readings from Last 3 Encounters:  11/28/22 86.7 kg (191 lb 3.2 oz)  10/09/22 88.5 kg (195 lb 3.2 oz)  09/20/22 86.4 kg (190 lb 7.6 oz)   PHYSICAL EXAM General:  NAD. No resp difficulty, walked into clinic HEENT: Normal Neck: Supple. No JVD, thick neck. Carotids 2+ bilat; no bruits. No lymphadenopathy or thryomegaly appreciated. Cor: PMI nondisplaced. Regular rate & rhythm. No rubs, gallops or murmurs. Lungs: Clear Abdomen: Soft, obese, nontender, nondistended. No hepatosplenomegaly. No bruits or masses. Good bowel sounds. Extremities: No cyanosis, clubbing, rash, edema Neuro: Alert & oriented x 3, cranial nerves grossly intact. Moves all 4 extremities w/o difficulty. Affect pleasant.  ECG (personally reviewed): NSR, 77 bpm, no acute ST-T changes  ASSESSMENT & PLAN: 1. CAD - prior anterior MI w/ h/o PCI to mLAD and mLCX - Echo w/ drop in EF, from 55% >> 20%, RV mildly reduced - Cath (10/23): 90% in-stent restenosis of mLAD and CTO mRCA with left-to-right collaterals. LCx stent widely patent.  - S/P PCI DES mid LAD. - Had chest pressure during recent GI illness, none since. Consider long acting nitrate down the road if BP permits. - Continue Plavix 75 mg daily. - Plan aspirin + clopidogrel at least 12 months, but hopefully long term DAPT. - Continue Repatha - Continue CR.   2. Chronic Systolic Heart Failure - Previous history of ICM following prior anterior MI.  - EF previously 25-30%, improved post LAD and LCx PCI - Echo (9/21): EF 60-65% - Echo (04/06/21): EF 50-55% mild anterior and apical HK - Echo (10/23): EF down 20%, RV mildly reduced in setting of LAD and RCA infarcts, previously placed mLAD stent w/ 99% stenosis, CTO mRCA w/ L>R collaterals  - RHC (10/23):  w/ elevated R+ L filling pressures and low output, RA 23, PCWP 30, CI  2.0  - Improved NYHA II, I  suspect lung issues and physical deconditioning playing some role. Volume looks OK. - Continue Entresto 24/26 mg bid.  - Continue Lasix 40 mg daily. - Continue spironolactone 25 mg daily.   - Continue carvedilol 3.125 mg bid.  - no SGLT2i w/ poorly controlled DM.  - Labs today.  - Repeat echo next visit.   3. Type 2DM - poorly controlled, in setting of poor compliance w/ regimen - Hgb A1c 13.1 (11/23) - No SGLT2i until better controlled. - Refer to [name] for semaglutide - She has been referred to Endocrinology, has seen Dr. Cruzita Lederer in the past. Unfortunately 1st available is not until 07/2023. Will see if we can refer elsewhere.   4. HLD w/ LDL Goal < 55 - Statin intolerant. - Continue Repatha + Zetia - LDL 86 on admit  - May need trial of very low dose statin as adjunct.    5. H/o Tobacco Use - Quit cigarettes, currently vapes  - Discussed cessation. - Probably needs PFTs, defer to PCP.  6. Obesity - Body mass index is 34.97 kg/m. - As above, refer to [name] to discuss semaglutide. Hopefully insurance will cover as she has DM2  Follow up in 2-3 months with Dr. Haroldine Hernandez + echo.  Allena Katz, FNP-BC 11/28/22

## 2022-11-28 NOTE — Telephone Encounter (Signed)
-----   Message from Julie Mccallum, RN sent at 11/28/2022  9:32 AM EST ----- Patient was seen in clinic by Allena Katz NP she wants her referred to pharmacy for  [facility] for DM2 and obesity  Thank you  Child psychotherapist

## 2022-11-28 NOTE — Research (Signed)
ANALOG Informed Consent   Subject Name: Julie Hernandez  Subject met inclusion and exclusion criteria.  The informed consent form, study requirements and expectations were reviewed with the subject and questions and concerns were addressed prior to the signing of the consent form.  The subject verbalized understanding of the trial requirements.  The subject agreed to participate in the ANALOG trial and signed the informed consent at 09:50 on 11-28-2022.  The informed consent was obtained prior to performance of any protocol-specific procedures for the subject.  A copy of the signed informed consent was given to the subject and a copy was placed in the subject's medical record.   [place] Chabely Norby, Research Coordinator 11/28/2022  09:50 a.m.

## 2022-11-28 NOTE — Progress Notes (Signed)
Pharmacy notified of semaglutide referral

## 2022-11-28 NOTE — Telephone Encounter (Signed)
-----   Message from Rafael Bihari, [place] sent at 11/28/2022 11:48 AM EST ----- Labs ok, but BNP remains elevated.  Increase Lasix to 40 mg bid x 3 days, then new dose of Lasix 60 mg daily.   Start 20 KCL daily. Repeat BMET in 10-14 days

## 2022-11-28 NOTE — Telephone Encounter (Signed)
Patient called.  Patient aware.  

## 2022-11-28 NOTE — Patient Instructions (Addendum)
Thank you for coming in today  EKG today  Labs were done today, if any labs are abnormal the clinic will call you No news is good news  You have been referred to Endocrinology in another [place] location and will be contacted for further appointment scheduling   Your physician recommends that you schedule a follow-up appointment in:  2-3 months with Dr. Haroldine Laws with echocardiogram  Your physician has requested that you have an echocardiogram. Echocardiography is a painless test that uses sound waves to create images of your heart. It provides your doctor with information about the size and shape of your heart and how well your heart's chambers and valves are working. This procedure takes approximately one hour. There are no restrictions for this procedure.      Do the following things EVERYDAY: Weigh yourself in the morning before breakfast. Write it down and keep it in a log. Take your medicines as prescribed Eat low salt foods--Limit salt (sodium) to 2000 mg per day.  Stay as active as you can everyday Limit all fluids for the day to less than 2 liters  At the [facility], you and your health needs are our priority. As part of our continuing mission to provide you with exceptional heart care, we have created designated Provider Care Teams. These Care Teams include your primary Cardiologist (physician) and Advanced Practice Providers (APPs- Physician Assistants and Nurse Practitioners) who all work together to provide you with the care you need, when you need it.   You may see any of the following providers on your designated Care Team at your next follow up: Dr Glori Bickers Dr Loralie Champagne Dr. Roxana Hires, NP Lyda Jester, [place] [facility] [place], [place] Forestine Na, NP Audry Riles, [name]   Please be sure to bring in all your medications bottles to every appointment.   If you have any questions or concerns before your  next appointment please send [place] a message through [place] or call our office at [phone].    TO LEAVE A MESSAGE FOR THE NURSE SELECT OPTION 2, PLEASE LEAVE A MESSAGE INCLUDING: YOUR NAME DATE OF BIRTH CALL BACK NUMBER REASON FOR CALL**this is important as we prioritize the call backs  YOU WILL RECEIVE A CALL BACK THE SAME DAY AS LONG AS YOU CALL BEFORE 4:00 PM

## 2022-11-29 ENCOUNTER — Encounter: Payer: Self-pay | Admitting: Pharmacist

## 2022-11-29 ENCOUNTER — Ambulatory Visit: Payer: PPO | Attending: Internal Medicine | Admitting: Pharmacist

## 2022-11-29 DIAGNOSIS — E1165 Type 2 diabetes mellitus with hyperglycemia: Secondary | ICD-10-CM

## 2022-11-29 DIAGNOSIS — E1159 Type 2 diabetes mellitus with other circulatory complications: Secondary | ICD-10-CM | POA: Diagnosis not present

## 2022-11-29 MED ORDER — METFORMIN HCL 500 MG PO TABS: 1000.0000 mg | ORAL_TABLET | Freq: Two times a day (BID) | ORAL | 3 refills | Status: DC

## 2022-11-29 NOTE — Patient Instructions (Addendum)
It was nice meeting you today  We would like your fasting blood sugar to be between 80-130 and your A1c to be less than 7.0%  Please continue your metformin 1000mg  (2 tablets) twice a day  Please continue your Lantus daily. I would like you to increase your Lantus by 2 units every 3 days until your fasting blood sugar is less than 130  We will check your A1c today  I will see you back in about 3 weeks and depending on your levels we will start the [place], [name], [place], [place], [place], [place], [place], [address] Phone: [phone], Fax: [phone]

## 2022-11-29 NOTE — Progress Notes (Signed)
Patient ID: [name]                 DOB: 10/02/1971                    MRN: [id number]     HPI: Julie Hernandez is a 52 y.o. female patient referred to pharmacy clinic by Allena Katz to discuss possible GLP-1 therapy.  PMH is significant for obesity, uncontrolled DM, NSTEMI, angina, HFrEF, and hypothyroidism . Most recent BMI 34.85. Most recent A1c 13.1.  Patient presents today to discuss possibly starting Ozempic. Has not had any diabetic education. IS frustrated since she feels poorly and can not control blood sugars. Currently managed on Lantus 14 units daily and metformin 100mg  BID.  Recently has started using [employer] sensor. FBG today in 180s. Reports she has eliminated carbohydrates from her diet but has not lost weight or seen any corresponding blood sugar decrease.   Has appointment with endocrinology in late February.    Labs: Lab Results  Component Value Date   HGBA1C 13.1 (H) 09/18/2022    Wt Readings from Last 1 Encounters:  11/28/22 191 lb 3.2 oz (86.7 kg)    BP Readings from Last 1 Encounters:  11/28/22 100/68   Pulse Readings from Last 1 Encounters:  11/28/22 79       Component Value Date/Time   CHOL 157 09/17/2022 0659   CHOL 113 06/21/2020 0922   TRIG 188 (H) 09/17/2022 0659   HDL 33 (L) 09/17/2022 0659   HDL 33 (L) 06/21/2020 0922   CHOLHDL 4.8 09/17/2022 0659   VLDL 38 09/17/2022 0659   LDLCALC 86 09/17/2022 0659   LDLCALC 54 06/21/2020 0922    Past Medical History:  Diagnosis Date   CHF (congestive heart failure) ([place])    Coronary artery disease    COVID 12/03/2020   Diabetes mellitus without complication (HCC)    Hypertension     Current Outpatient Medications on File Prior to Visit  Medication Sig Dispense Refill   albuterol (VENTOLIN HFA) 108 (90 Base) MCG/ACT inhaler Inhale 2 puffs into the lungs every 4 (four) hours as needed for wheezing or shortness of breath.     aspirin EC 81 MG tablet Take 1 tablet (81 mg total)  by mouth daily. 30 tablet 11   blood glucose meter kit and supplies Dispense based on patient and insurance preference. Use up to four times daily as directed. (FOR ICD-10 E10.9, E11.9). 1 each 1   carvedilol (COREG) 3.125 MG tablet Take 1 tablet (3.125 mg total) by mouth 2 (two) times daily with a meal. 60 tablet 5   clopidogrel (PLAVIX) 75 MG tablet Take 1 tablet (75 mg total) by mouth daily. 30 tablet 11   escitalopram (LEXAPRO) 10 MG tablet Take 10 mg by mouth daily.     Evolocumab (REPATHA SURECLICK) 440 MG/ML SOAJ Inject 140 mg into the skin every 14 (fourteen) days. 6 mL 3   ezetimibe (ZETIA) 10 MG tablet Take 1 tablet (10 mg total) by mouth daily at 6pm. (Patient taking differently: Take 10 mg by mouth daily.) 30 tablet 11   famotidine (PEPCID) 40 MG tablet Take 40 mg by mouth daily as needed for heartburn.     furosemide (LASIX) 20 MG tablet Take 3 tablets (60 mg total) by mouth daily. . 270 tablet 3   insulin glargine (LANTUS) 100 UNIT/ML Solostar Pen Inject 14 Units into the skin daily. Further refills by PCP 15 mL 0  Insulin Pen Needle 32G X 4 MM MISC Use with insulin pen 100 each 0   metFORMIN (GLUCOPHAGE) 500 MG tablet Take 2 tablets (1,000 mg total) by mouth 2 (two) times daily with a meal. 360 tablet 3   nitroGLYCERIN (NITROSTAT) 0.4 MG SL tablet PLACE 1 TABLET UNDER THE TONGUE AND ALLOW TO DISSOLVE SLOWLY, DO NOT CHEW OR SWALLOW. YOU MAY USE ADDITIONAL TABLETS EVERY 5 MINUTES BUT NO MORE THAN 3 TABLETS. 25 tablet 1   ondansetron (ZOFRAN ODT) 4 MG disintegrating tablet Take 1 tablet (4 mg total) by mouth every 8 (eight) hours as needed for nausea or vomiting. 10 tablet 0   potassium chloride SA (KLOR-CON M) 20 MEQ tablet Take 1 tablet (20 mEq total) by mouth daily. 90 tablet 3   promethazine-dextromethorphan (PROMETHAZINE-DM) 6.25-15 MG/5ML syrup Take 5 mLs by mouth daily as needed for cough. (Patient not taking: Reported on 11/28/2022)     RABEprazole (ACIPHEX) 20 MG tablet Take 40  mg by mouth daily.     sacubitril-valsartan (ENTRESTO) 24-26 MG Take 1 tablet by mouth 2 (two) times daily. 60 tablet 11   spironolactone (ALDACTONE) 25 MG tablet Take 1 tablet (25 mg total) by mouth daily. 30 tablet 5   No current facility-administered medications on file prior to visit.    Allergies  Allergen Reactions   Esomeprazole Magnesium Anaphylaxis   Ciprofibrate Itching   Ciprofloxacin Itching   Gabapentin Other (See Comments)    Achy - myalgia   Lubiprostone Diarrhea   Statins     Myalgia - flu like symptoms   Amlodipine Rash   Iron Rash and Swelling   Latex Rash and Swelling   Povidone Iodine Rash     Assessment/Plan:  1. T2DM - Patient most recent A1c 13.1% which is above goal of <7.0% and patient feels poorly. Manifesting in microvascular and possibly macrovascular complications. Has not received much diabetic education but is eager to learn and feel better.  Explained pathophysiology of T2DM in detail. Explained mechanisms of insulin resistance and glucose metabolism. Explained role that diet, exercise, and medications play in controlling blood sugar. Patient voiced understanding.  Patient would be a good candidate for Ozempic however will lower blood sugar gradually over next few weeks using her basal insulin and metformin. Basal dose is currently insufficient. Instructed patient to begin increasing Lantus by 2 units every 3 days until FBG <130.  Update A1c today.  Will follow up with patient in 3 weeks to assess blood sugar monitoring and consider starting Ozempic at that point. Patient satisfied.  Continue metformin 100mg  BID Continue Lantus 14 units with slow titration until FBG at goal Check A1c Recheck in 3 weeks  Karren Cobble, [name], [place], [place], [place], [place], [place], [address] Phone: [phone], Fax: [phone]

## 2022-11-30 LAB — HEMOGLOBIN A1C
Est. average glucose Bld gHb Est-mCnc: 197 mg/dL
Hgb A1c MFr Bld: 8.5 % — ABNORMAL HIGH (ref 4.8–5.6)

## 2022-12-07 ENCOUNTER — Encounter (HOSPITAL_COMMUNITY): Payer: Self-pay | Admitting: Internal Medicine

## 2022-12-07 ENCOUNTER — Ambulatory Visit (HOSPITAL_COMMUNITY)
Admission: RE | Admit: 2022-12-07 | Discharge: 2022-12-07 | Disposition: A | Discharge status: Home / Self Care | Payer: PPO | Source: Ambulatory Visit | Attending: Cardiology | Admitting: Cardiology

## 2022-12-07 DIAGNOSIS — I5022 Chronic systolic (congestive) heart failure: Secondary | ICD-10-CM

## 2022-12-07 LAB — BASIC METABOLIC PANEL
Anion gap: 12 (ref 5–15)
BUN: 34 mg/dL — ABNORMAL HIGH (ref 6–20)
CO2: 23 mmol/L (ref 22–32)
Calcium: 9.5 mg/dL (ref 8.9–10.3)
Chloride: 104 mmol/L (ref 98–111)
Creatinine, Ser: 1.04 mg/dL — ABNORMAL HIGH (ref 0.44–1.00)
GFR, Estimated: >60 mL/min (ref >60)
Glucose, Bld: 152 mg/dL — ABNORMAL HIGH (ref 70–99)
Potassium: 5.1 mmol/L (ref 3.5–5.1)
Sodium: 139 mmol/L (ref 135–145)

## 2022-12-20 ENCOUNTER — Ambulatory Visit: Payer: PPO | Attending: Internal Medicine | Admitting: Pharmacist

## 2022-12-20 ENCOUNTER — Encounter: Payer: Self-pay | Admitting: Pharmacist

## 2022-12-20 VITALS — BP 108/65 | HR 83 | Wt 188.0 lb

## 2022-12-20 DIAGNOSIS — E1165 Type 2 diabetes mellitus with hyperglycemia: Secondary | ICD-10-CM | POA: Diagnosis not present

## 2022-12-20 DIAGNOSIS — I214 Non-ST elevation (NSTEMI) myocardial infarction: Secondary | ICD-10-CM | POA: Diagnosis not present

## 2022-12-20 DIAGNOSIS — E1159 Type 2 diabetes mellitus with other circulatory complications: Secondary | ICD-10-CM | POA: Diagnosis not present

## 2022-12-20 MED ORDER — OZEMPIC (0.25 OR 0.5 MG/DOSE) 2 MG/3ML ~~LOC~~ SOPN: 0.2500 mg | PEN_INJECTOR | SUBCUTANEOUS | 0 refills | Status: DC

## 2022-12-20 NOTE — Patient Instructions (Signed)
It was good seeing you again  Your blood sugar looks much better!  Continue your healthy eating habits and exercising at rehab  Continue metformin 1000mg  twice a day and Lantus 24 units daily, increase by 2 units every 3 days until your fasting blood sugar is less than 130  We will start Ozempic once a week.  Your dose will be 0.25mg  once weekly for 4 weeks.  After 4 weeks, we will increase to 0.5mg  once weekly  I will see you back in 1 month  Karren Cobble, [name], [place], [place], [place], [place], [place], [address] Phone: [phone], Fax: [phone]

## 2022-12-20 NOTE — Progress Notes (Signed)
Patient ID: Julie Hernandez                 DOB: Jun 08, 1971                    MRN: [id number]     HPI: Julie Hernandez is a 52 y.o. female patient referred to pharmacy clinic by [facility]. PMH is significant for obesity, uncontrolled DM, NSTEMI, angina, HFrEF, and hypothyroidism . Most recent BMI 34.85.   Patient presents today for folow up. Initial A1c at 13.1. Repeat A1c on 11/29/22 8.5. Has made diet changes and has been slowly titrating insulin.    Showed glucose log on phone.  Readings now predominantly within 70-180 although fasting BG remains around 150. Much improved since last visit however. Had one nighttime hypoglycemic episode where alarm went off and BG was 70.  Reports is sleeping much better and not having to awaken as often to urinate.   Has been working on diet changes. Eating more salads, vegetables, spaghetti squash, chicken, and low carb tortilla wraps. Is active at cardiac rehab.  Has follow up with HF clinic this month and initial visit with endocrinology in 3 weeks. Unclear what thyroid level is.  Currently on 22 units Lantus and increasing by 2 units every 3 days. BP has been well controlled.  Labs: Lab Results  Component Value Date   HGBA1C 13.1 (H) 09/18/2022    Wt Readings from Last 1 Encounters:  11/28/22 191 lb 3.2 oz (86.7 kg)    BP Readings from Last 1 Encounters:  11/28/22 100/68   Pulse Readings from Last 1 Encounters:  11/28/22 79       Component Value Date/Time   CHOL 157 09/17/2022 0659   CHOL 113 06/21/2020 0922   TRIG 188 (H) 09/17/2022 0659   HDL 33 (L) 09/17/2022 0659   HDL 33 (L) 06/21/2020 0922   CHOLHDL 4.8 09/17/2022 0659   VLDL 38 09/17/2022 0659   LDLCALC 86 09/17/2022 0659   LDLCALC 54 06/21/2020 0922    Past Medical History:  Diagnosis Date   CHF (congestive heart failure) ([place])    Coronary artery disease    COVID 12/03/2020   Diabetes mellitus without complication (HCC)    Hypertension     Current Outpatient  Medications on File Prior to Visit  Medication Sig Dispense Refill   albuterol (VENTOLIN HFA) 108 (90 Base) MCG/ACT inhaler Inhale 2 puffs into the lungs every 4 (four) hours as needed for wheezing or shortness of breath.     aspirin EC 81 MG tablet Take 1 tablet (81 mg total) by mouth daily. 30 tablet 11   blood glucose meter kit and supplies Dispense based on patient and insurance preference. Use up to four times daily as directed. (FOR ICD-10 E10.9, E11.9). 1 each 1   carvedilol (COREG) 3.125 MG tablet Take 1 tablet (3.125 mg total) by mouth 2 (two) times daily with a meal. 60 tablet 5   clopidogrel (PLAVIX) 75 MG tablet Take 1 tablet (75 mg total) by mouth daily. 30 tablet 11   escitalopram (LEXAPRO) 10 MG tablet Take 10 mg by mouth daily.     Evolocumab (REPATHA SURECLICK) 010 MG/ML SOAJ Inject 140 mg into the skin every 14 (fourteen) days. 6 mL 3   ezetimibe (ZETIA) 10 MG tablet Take 1 tablet (10 mg total) by mouth daily at 6pm. (Patient taking differently: Take 10 mg by mouth daily.) 30 tablet 11   famotidine (PEPCID) 40 MG  tablet Take 40 mg by mouth daily as needed for heartburn.     furosemide (LASIX) 20 MG tablet Take 3 tablets (60 mg total) by mouth daily. . 270 tablet 3   insulin glargine (LANTUS) 100 UNIT/ML Solostar Pen Inject 14 Units into the skin daily. Further refills by PCP 15 mL 0   Insulin Pen Needle 32G X 4 MM MISC Use with insulin pen 100 each 0   metFORMIN (GLUCOPHAGE) 500 MG tablet Take 2 tablets (1,000 mg total) by mouth 2 (two) times daily with a meal. 360 tablet 3   nitroGLYCERIN (NITROSTAT) 0.4 MG SL tablet PLACE 1 TABLET UNDER THE TONGUE AND ALLOW TO DISSOLVE SLOWLY, DO NOT CHEW OR SWALLOW. YOU MAY USE ADDITIONAL TABLETS EVERY 5 MINUTES BUT NO MORE THAN 3 TABLETS. 25 tablet 1   ondansetron (ZOFRAN ODT) 4 MG disintegrating tablet Take 1 tablet (4 mg total) by mouth every 8 (eight) hours as needed for nausea or vomiting. 10 tablet 0   potassium chloride SA (KLOR-CON M) 20  MEQ tablet Take 1 tablet (20 mEq total) by mouth daily. 90 tablet 3   promethazine-dextromethorphan (PROMETHAZINE-DM) 6.25-15 MG/5ML syrup Take 5 mLs by mouth daily as needed for cough. (Patient not taking: Reported on 11/28/2022)     RABEprazole (ACIPHEX) 20 MG tablet Take 40 mg by mouth daily.     sacubitril-valsartan (ENTRESTO) 24-26 MG Take 1 tablet by mouth 2 (two) times daily. 60 tablet 11   spironolactone (ALDACTONE) 25 MG tablet Take 1 tablet (25 mg total) by mouth daily. 30 tablet 5   No current facility-administered medications on file prior to visit.    Allergies  Allergen Reactions   Esomeprazole Magnesium Anaphylaxis   Ciprofibrate Itching   Ciprofloxacin Itching   Gabapentin Other (See Comments)    Achy - myalgia   Lubiprostone Diarrhea   Statins     Myalgia - flu like symptoms   Amlodipine Rash   Iron Rash and Swelling   Latex Rash and Swelling   Povidone Iodine Rash     Assessment/Plan:  1. T2DM - Patient most recent A1c 8.5 which is above goal of <7.0% but much improved from 13.1%. Has made progress with lifestyle and diet changes.   Encouraged patient to keep endocrinology appointment and request thyroid panel since she reports she had previously been on treatment.  Will continue to slowly titrate Lantus by 2 units every 3 days since FBG remains above 130. One hypoglycemic episode. Encouraged her to have healthy snack before bed.  Will start Ozempic at this visit for DM management and cardiac health. Using demo pen, educated patient on mechanism of aciton, storage, site slection, administration and possible adverse effects. Gave sample pen in office with instructions to start at 0.25mg  once weekly and increase to 0.5mg  on week 5. Will complete PA and contact patient when approved.  F/u in 4 weeks.  Continue metformin 100mg  BID Continue Lantus 22 units daily, increase by 2 units every 3 days until FBG <130 Start Ozempic 0.25mg  weekly x 4 weeks then increase to  0.5mg  weekly Recheck in 4 weeks  Karren Cobble, [name], [place], [place], [place], [place] [employer] [place], [place], [address] Phone: [phone], Fax: [phone]

## 2022-12-25 ENCOUNTER — Other Ambulatory Visit: Payer: Self-pay

## 2022-12-25 ENCOUNTER — Encounter (HOSPITAL_COMMUNITY): Payer: Self-pay

## 2022-12-25 ENCOUNTER — Emergency Department (HOSPITAL_COMMUNITY): Payer: PPO

## 2022-12-25 ENCOUNTER — Emergency Department (HOSPITAL_COMMUNITY)
Admission: EM | Admit: 2022-12-25 | Discharge: 2022-12-25 | Disposition: A | Discharge status: Home / Self Care | Payer: PPO | Attending: Emergency Medicine | Admitting: Emergency Medicine

## 2022-12-25 DIAGNOSIS — I959 Hypotension, unspecified: Secondary | ICD-10-CM | POA: Diagnosis not present

## 2022-12-25 DIAGNOSIS — E119 Type 2 diabetes mellitus without complications: Secondary | ICD-10-CM | POA: Insufficient documentation

## 2022-12-25 DIAGNOSIS — Z7982 Long term (current) use of aspirin: Secondary | ICD-10-CM | POA: Insufficient documentation

## 2022-12-25 DIAGNOSIS — E039 Hypothyroidism, unspecified: Secondary | ICD-10-CM | POA: Diagnosis not present

## 2022-12-25 DIAGNOSIS — Z794 Long term (current) use of insulin: Secondary | ICD-10-CM | POA: Diagnosis not present

## 2022-12-25 DIAGNOSIS — R000 Tachycardia, unspecified: Secondary | ICD-10-CM | POA: Diagnosis present

## 2022-12-25 DIAGNOSIS — I509 Heart failure, unspecified: Secondary | ICD-10-CM | POA: Insufficient documentation

## 2022-12-25 DIAGNOSIS — I471 Supraventricular tachycardia, unspecified: Secondary | ICD-10-CM | POA: Diagnosis not present

## 2022-12-25 DIAGNOSIS — Z91040 Latex allergy status: Secondary | ICD-10-CM | POA: Diagnosis not present

## 2022-12-25 DIAGNOSIS — Z7984 Long term (current) use of oral hypoglycemic drugs: Secondary | ICD-10-CM | POA: Insufficient documentation

## 2022-12-25 LAB — BASIC METABOLIC PANEL
Anion gap: 13 (ref 5–15)
BUN: 24 mg/dL — ABNORMAL HIGH (ref 6–20)
CO2: 23 mmol/L (ref 22–32)
Calcium: 9 mg/dL (ref 8.9–10.3)
Chloride: 103 mmol/L (ref 98–111)
Creatinine, Ser: 1.02 mg/dL — ABNORMAL HIGH (ref 0.44–1.00)
GFR, Estimated: >60 mL/min (ref >60)
Glucose, Bld: 113 mg/dL — ABNORMAL HIGH (ref 70–99)
Potassium: 4.2 mmol/L (ref 3.5–5.1)
Sodium: 139 mmol/L (ref 135–145)

## 2022-12-25 LAB — CBC
HCT: 43.8 % (ref 36.0–46.0)
Hemoglobin: 13.6 g/dL (ref 12.0–15.0)
MCH: 27.3 pg (ref 26.0–34.0)
MCHC: 31.1 g/dL (ref 30.0–36.0)
MCV: 87.8 fL (ref 80.0–100.0)
Platelets: 347 10*3/uL (ref 150–400)
RBC: 4.99 MIL/uL (ref 3.87–5.11)
RDW: 13.8 % (ref 11.5–15.5)
WBC: 9 10*3/uL (ref 4.0–10.5)
nRBC: 0 % (ref 0.0–0.2)

## 2022-12-25 LAB — LACTIC ACID, PLASMA: Lactic Acid, Venous: 1.2 mmol/L (ref 0.5–1.9)

## 2022-12-25 LAB — TROPONIN I (HIGH SENSITIVITY)
Troponin I (High Sensitivity): 33 ng/L — ABNORMAL HIGH (ref <18)
Troponin I (High Sensitivity): 38 ng/L — ABNORMAL HIGH (ref <18)

## 2022-12-25 LAB — CBG MONITORING, ED: Glucose-Capillary: 110 mg/dL — ABNORMAL HIGH (ref 70–99)

## 2022-12-25 MED ORDER — SODIUM CHLORIDE 0.9 % IV BOLUS
500.0000 mL | Freq: Once | INTRAVENOUS | Status: AC
  Administered 2022-12-25: 500 mL via INTRAVENOUS

## 2022-12-25 MED ORDER — SODIUM CHLORIDE 0.9 % IV BOLUS: 1000.0000 mL | Freq: Once | INTRAVENOUS | Status: DC

## 2022-12-25 NOTE — ED Provider Notes (Signed)
[facility] Provider Note   CSN: [id number] Arrival date & time: 12/25/22  1816     History  Chief Complaint  Patient presents with   Tachycardia    Julie Hernandez is a 52 y.o. female.  Patient with h/o uncontrolled DM, NSTEMI, angina, HFrEF, and hypothyroidism -- presents to the ED for evaluation for elevated heart rate.  She awoke feeling well yesterday, was a little tired last night after cardiac rehab.  She noted a tight pressure transition in the left chest like a knot underneath her chest wall, starting around 12:30 PM today.  She had a pulse oximeter which showed heart rate of 160-180.  Symptoms persisted during afternoon.  A family member encouraged her to go to an urgent care where she was found to be in SVT.  EMS was called.  They performed vagal maneuvers with improvement of heart rate.  She did have frequent ectopy.  Patient did state that they made mention of this at cardiac rehab yesterday.  No lightheadedness or syncope.  Adenosine was not given.  She is feeling a lot better now and her heart rate is 100--110 in the room, normal sinus rhythm.  She was given a liter fluid bolus by EMS.  Blood pressure has been little soft.       Home Medications Prior to Admission medications   Medication Sig Start Date End Date Taking? Authorizing Provider  albuterol (VENTOLIN HFA) 108 (90 Base) MCG/ACT inhaler Inhale 2 puffs into the lungs every 4 (four) hours as needed for wheezing or shortness of breath.    [provider]  aspirin EC 81 MG tablet Take 1 tablet (81 mg total) by mouth daily. 10/19/22   Earnie Larsson, NP  blood glucose meter kit and supplies Dispense based on patient and insurance preference. Use up to four times daily as directed. (FOR ICD-10 E10.9, E11.9). 04/08/20   Leanor Kail, PA  carvedilol (COREG) 3.125 MG tablet Take 1 tablet (3.125 mg total) by mouth 2 (two) times daily with a meal. 10/19/22   Earnie Larsson,  NP  clopidogrel (PLAVIX) 75 MG tablet Take 1 tablet (75 mg total) by mouth daily. 10/09/22   Milford, Maricela Bo, FNP  Continuous Blood Gluc Sensor (FREESTYLE LIBRE 3 SENSOR) MISC Change sensor every 14 days 10/03/22   [provider]  escitalopram (LEXAPRO) 10 MG tablet Take 10 mg by mouth daily.    [provider]  Evolocumab (REPATHA SURECLICK) 628 MG/ML SOAJ Inject 140 mg into the skin every 14 (fourteen) days. 10/09/22   Milford, Maricela Bo, FNP  ezetimibe (ZETIA) 10 MG tablet Take 1 tablet (10 mg total) by mouth daily at 6pm. Patient taking differently: Take 10 mg by mouth daily. 03/30/22   Bensimhon, Shaune Pascal, MD  famotidine (PEPCID) 40 MG tablet Take 40 mg by mouth daily as needed for heartburn.    [provider]  furosemide (LASIX) 20 MG tablet Take 3 tablets (60 mg total) by mouth daily. . 11/28/22   Rafael Bihari, FNP  insulin glargine (LANTUS) 100 UNIT/ML Solostar Pen Inject 22 Units into the skin daily. 11/29/22   Pavero, Harrell Gave, RPH  Insulin Pen Needle 32G X 4 MM MISC Use with insulin pen 09/20/22   Earnie Larsson, NP  metFORMIN (GLUCOPHAGE) 500 MG tablet Take 2 tablets (1,000 mg total) by mouth 2 (two) times daily with a meal. 11/29/22   Pavero, Harrell Gave, RPH  nitroGLYCERIN (NITROSTAT) 0.4 MG  SL tablet PLACE 1 TABLET UNDER THE TONGUE AND ALLOW TO DISSOLVE SLOWLY, DO NOT CHEW OR SWALLOW. YOU MAY USE ADDITIONAL TABLETS EVERY 5 MINUTES BUT NO MORE THAN 3 TABLETS. 11/13/22   Bensimhon, Shaune Pascal, MD  ondansetron (ZOFRAN ODT) 4 MG disintegrating tablet Take 1 tablet (4 mg total) by mouth every 8 (eight) hours as needed for nausea or vomiting. 01/05/21   Isla Pence, MD  potassium chloride SA (KLOR-CON M) 20 MEQ tablet Take 1 tablet (20 mEq total) by mouth daily. 11/28/22   Rafael Bihari, FNP  promethazine-dextromethorphan (PROMETHAZINE-DM) 6.25-15 MG/5ML syrup Take 5 mLs by mouth daily as needed for cough. Patient not taking: Reported on 11/28/2022  09/13/22   [provider]  RABEprazole (ACIPHEX) 20 MG tablet Take 40 mg by mouth daily.    [provider]  sacubitril-valsartan (ENTRESTO) 24-26 MG Take 1 tablet by mouth 2 (two) times daily. 10/09/22   Milford, Maricela Bo, FNP  Semaglutide,0.25 or 0.5MG /DOS, (OZEMPIC, 0.25 OR 0.5 MG/DOSE,) 2 MG/3ML SOPN Inject 0.25 mg into the skin once a week. 12/20/22   Pavero, Harrell Gave, RPH  spironolactone (ALDACTONE) 25 MG tablet Take 1 tablet (25 mg total) by mouth daily. 11/13/22   Earnie Larsson, NP      Allergies    Esomeprazole magnesium, Ciprofibrate, Ciprofloxacin, Gabapentin, Lubiprostone, Statins, Amlodipine, Iron, Latex, and Povidone iodine    Review of Systems   Review of Systems  Physical Exam Updated Vital Signs BP 91/66   Pulse 77   Temp 97.7 F (36.5 C) (Oral)   Resp 18   SpO2 99%   Physical Exam Vitals and nursing note reviewed.  Constitutional:      Appearance: She is well-developed. She is not diaphoretic.  HENT:     Head: Normocephalic and atraumatic.     Mouth/Throat:     Mouth: Mucous membranes are not dry.  Eyes:     Conjunctiva/sclera: Conjunctivae normal.  Neck:     Vascular: Normal carotid pulses. No JVD.     Trachea: Trachea normal. No tracheal deviation.  Cardiovascular:     Rate and Rhythm: Normal rate and regular rhythm. Frequent Extrasystoles are present.    Pulses: No decreased pulses.          Radial pulses are 2+ on the right side and 2+ on the left side.     Heart sounds: Normal heart sounds, S1 normal and S2 normal. No murmur heard. Pulmonary:     Effort: Pulmonary effort is normal. No respiratory distress.     Breath sounds: No wheezing.  Chest:     Chest wall: No tenderness.  Abdominal:     General: Bowel sounds are normal.     Palpations: Abdomen is soft.     Tenderness: There is no abdominal tenderness. There is no guarding or rebound.  Musculoskeletal:        General: Normal range of motion.     Cervical back: Normal  range of motion and neck supple. No muscular tenderness.  Skin:    General: Skin is warm and dry.     Coloration: Skin is not pale.  Neurological:     Mental Status: She is alert.     ED Results / Procedures / Treatments   Labs (all labs ordered are listed, but only abnormal results are displayed) Labs Reviewed  BASIC METABOLIC PANEL - Abnormal; Notable for the following components:      Result Value   Glucose, Bld 113 (*)  BUN 24 (*)    Creatinine, Ser 1.02 (*)    All other components within normal limits  CBG MONITORING, ED - Abnormal; Notable for the following components:   Glucose-Capillary 110 (*)    All other components within normal limits  TROPONIN I (HIGH SENSITIVITY) - Abnormal; Notable for the following components:   Troponin I (High Sensitivity) 33 (*)    All other components within normal limits  TROPONIN I (HIGH SENSITIVITY) - Abnormal; Notable for the following components:   Troponin I (High Sensitivity) 38 (*)    All other components within normal limits  CBC  LACTIC ACID, PLASMA  LACTIC ACID, PLASMA    EKG EKG Interpretation  Date/Time:  Tuesday December 25 2022 18:22:28 EST Ventricular Rate:  102 PR Interval:  150 QRS Duration: 110 QT Interval:  346 QTC Calculation: 451 R Axis:   63 Text Interpretation: Sinus tachycardia Ventricular premature complex Anterior infarct, old Since last tracing rate faster Confirmed by Dorie Rank [phone]) on 12/25/2022 6:34:43 PM  Radiology DG Chest 2 View  Result Date: 12/25/2022 CLINICAL DATA:  Chest pain and tachycardia EXAM: CHEST - 2 VIEW COMPARISON:  Chest radiograph September 16, 2022 and CT September 17, 2022 FINDINGS: Cardiac monitoring leads project over the chest. The heart size and mediastinal contours are within normal limits. Cardiac stents. Mild bibasilar interstitial opacities may reflect pulmonary edema. No pleural effusion. No pneumothorax. Thoracic spondylosis. IMPRESSION: Mild bibasilar interstitial  opacities may reflect pulmonary edema. Electronically Signed   By: Dahlia Bailiff M.D.   On: 12/25/2022 18:58    Procedures Procedures    Medications Ordered in ED Medications  sodium chloride 0.9 % bolus 500 mL (0 mLs Intravenous Stopped 12/25/22 2301)    ED Course/ Medical Decision Making/ A&P    Patient seen and examined. History obtained directly from patient.  Reviewed outpatient cardiology notes and spoke directly with EMS in regards to progression of symptoms during transport.    Labs/EKG: Independently reviewed and interpreted.  This included: Reviewed urgent care EKG demonstrating supraventricular tachycardia.  CBC, BMP, troponin, EKG ordered.  Imaging: Chest x-ray ordered  Medications/Fluids: None ordered  Initial impression: Supraventricular tachycardia converted to sinus tachycardia    Reassessment performed. Patient appears stable.  Blood pressures have been soft, currently over 80/50.  She confirms that she typically runs, 70-623 systolic.  Labs personally reviewed and interpreted including: CBC unremarkable; BMP unremarkable; troponin 33 >> 38.   Imaging personally visualized and interpreted including: Chest x-ray agree mild bibasilar interstitial opacities  Reviewed pertinent lab work and imaging with patient at bedside. Questions answered.   Plan: 500 cc fluid bolus and will reassess.  Patient discussed with Dr. Tomi Bamberger who will see.    11:09 PM Reassessment performed. Patient appears stable.  Patient has been able to ambulate to the bathroom without any difficulties.  I did consult with on-call cardiology fellow Dr. Humphrey Rolls.  Recommended holding Coreg and Entresto tomorrow due to hypotension today.  Patient can restart on Thursday if blood pressures stabilize back to her baseline.  Reviewed pertinent lab work and imaging with patient at bedside. Questions answered.   Most current vital signs reviewed and are as follows: BP 91/66   Pulse 77   Temp 97.7 F  (36.5 C) (Oral)   Resp 18   SpO2 99%   Plan: Discharge to home.   Prescriptions written for: None  ED return instructions discussed: Return with recurrent tachypalpitations, lightheadedness, chest pain, shortness of breath  Follow-up instructions discussed: Patient  encouraged to follow-up with their cardiologist in 3 days.                            Medical Decision Making Amount and/or Complexity of Data Reviewed Labs: ordered. Radiology: ordered.  Patient with SVT, new diagnosis, resolved prior to arrival.  Patient has remained in normal sinus during ED stay.  No concern for acute ACS, significant heart failure exacerbation, PE, dissection.  Blood pressures have been low, however patient is typically hypotensive.  She was given IV fluids.  Low concern for sepsis.  Lactate was normal.  Blood pressures trending towards improvement at time of arrival.  Cardiology recommendations obtained.  The patient's vital signs, pertinent lab work and imaging were reviewed and interpreted as discussed in the ED course. Hospitalization was considered for further testing, treatments, or serial exams/observation. However as patient is well-appearing, has a stable exam, and reassuring studies today, I do not feel that they warrant admission at this time. This plan was discussed with the patient who verbalizes agreement and comfort with this plan and seems reliable and able to return to the Emergency Department with worsening or changing symptoms.           Final Clinical Impression(s) / ED Diagnoses Final diagnoses:  SVT (supraventricular tachycardia)  Hypotension, unspecified hypotension type    Rx / DC Orders ED Discharge Orders     None         Renne Crigler, Cordelia Poche 12/25/22 2315    Linwood Dibbles, MD 12/27/22 0003

## 2022-12-25 NOTE — ED Triage Notes (Signed)
Pt BIB EMS from [facility] with c/o tachycardia. Pt endorses CP that radiates to left shoulder. Pt has hx of MI. Pt HR was 188 on EMS arrival and used vagal maneuvers and 1L of NS and pt converted to NSR. Pt did get hypotensive in the 90s. Pt a&ox4.

## 2022-12-25 NOTE — Discharge Instructions (Signed)
Your heart rate has remained in a normal rhythm during your ED stay.  As we discussed, please do not take the carvedilol or Entresto tomorrow as your blood pressures were running a bit low here.  If you do have your normal blood pressures tomorrow, you may restart on February 8.  If your blood pressures remain low or you feel lightheaded, contact your cardiology team for further instructions.  Return to the emergency department for recurrent palpitations, lightheadedness or passing out, worsening chest pain or other concerns.

## 2022-12-25 NOTE — ED Provider Notes (Incomplete)
[facility] Provider Note   CSN: [id number] Arrival date & time: 12/25/22  1816     History {Add pertinent medical, surgical, social history, OB history to HPI:1} Chief Complaint  Patient presents with   Tachycardia    Julie Hernandez is a 52 y.o. female.  Patient with h/o uncontrolled DM, NSTEMI, angina, HFrEF, and hypothyroidism -- presents to the ED for evaluation for elevated heart rate.  She awoke feeling well yesterday, was a little tired last night after cardiac rehab.  She noted a tight pressure transition in the left chest like a knot underneath her chest wall, starting around 12:30 PM today.  She had a pulse oximeter which showed heart rate of 160-180.  Symptoms persisted during afternoon.  A family member encouraged her to go to an urgent care where she was found to be in SVT.  EMS was called.  They performed vagal maneuvers with improvement of heart rate.  She did have frequent ectopy.  Patient did state that they made mention of this at cardiac rehab yesterday.  No lightheadedness or syncope.  Adenosine was not given.  She is feeling a lot better now and her heart rate is 100--110 in the room, normal sinus rhythm.  She was given a liter fluid bolus by EMS.  Blood pressure has been little soft.       Home Medications Prior to Admission medications   Medication Sig Start Date End Date Taking? Authorizing Provider  albuterol (VENTOLIN HFA) 108 (90 Base) MCG/ACT inhaler Inhale 2 puffs into the lungs every 4 (four) hours as needed for wheezing or shortness of breath.    [provider]  aspirin EC 81 MG tablet Take 1 tablet (81 mg total) by mouth daily. 10/19/22   Earnie Larsson, NP  blood glucose meter kit and supplies Dispense based on patient and insurance preference. Use up to four times daily as directed. (FOR ICD-10 E10.9, E11.9). 04/08/20   Leanor Kail, PA  carvedilol (COREG) 3.125 MG tablet Take 1 tablet (3.125 mg  total) by mouth 2 (two) times daily with a meal. 10/19/22   Earnie Larsson, NP  clopidogrel (PLAVIX) 75 MG tablet Take 1 tablet (75 mg total) by mouth daily. 10/09/22   Milford, Maricela Bo, FNP  Continuous Blood Gluc Sensor (FREESTYLE LIBRE 3 SENSOR) MISC Change sensor every 14 days 10/03/22   [provider]  escitalopram (LEXAPRO) 10 MG tablet Take 10 mg by mouth daily.    [provider]  Evolocumab (REPATHA SURECLICK) 993 MG/ML SOAJ Inject 140 mg into the skin every 14 (fourteen) days. 10/09/22   Milford, Maricela Bo, FNP  ezetimibe (ZETIA) 10 MG tablet Take 1 tablet (10 mg total) by mouth daily at 6pm. Patient taking differently: Take 10 mg by mouth daily. 03/30/22   Bensimhon, Shaune Pascal, MD  famotidine (PEPCID) 40 MG tablet Take 40 mg by mouth daily as needed for heartburn.    [provider]  furosemide (LASIX) 20 MG tablet Take 3 tablets (60 mg total) by mouth daily. . 11/28/22   Rafael Bihari, FNP  insulin glargine (LANTUS) 100 UNIT/ML Solostar Pen Inject 22 Units into the skin daily. 11/29/22   Pavero, Harrell Gave, RPH  Insulin Pen Needle 32G X 4 MM MISC Use with insulin pen 09/20/22   Earnie Larsson, NP  metFORMIN (GLUCOPHAGE) 500 MG tablet Take 2 tablets (1,000 mg total) by mouth 2 (two) times daily with a meal. 11/29/22  Pavero, Harrell Gave, RPH  nitroGLYCERIN (NITROSTAT) 0.4 MG SL tablet PLACE 1 TABLET UNDER THE TONGUE AND ALLOW TO DISSOLVE SLOWLY, DO NOT CHEW OR SWALLOW. YOU MAY USE ADDITIONAL TABLETS EVERY 5 MINUTES BUT NO MORE THAN 3 TABLETS. 11/13/22   Bensimhon, Shaune Pascal, MD  ondansetron (ZOFRAN ODT) 4 MG disintegrating tablet Take 1 tablet (4 mg total) by mouth every 8 (eight) hours as needed for nausea or vomiting. 01/05/21   Isla Pence, MD  potassium chloride SA (KLOR-CON M) 20 MEQ tablet Take 1 tablet (20 mEq total) by mouth daily. 11/28/22   Rafael Bihari, FNP  promethazine-dextromethorphan (PROMETHAZINE-DM) 6.25-15 MG/5ML syrup Take 5 mLs by mouth  daily as needed for cough. Patient not taking: Reported on 11/28/2022 09/13/22   [provider]  RABEprazole (ACIPHEX) 20 MG tablet Take 40 mg by mouth daily.    [provider]  sacubitril-valsartan (ENTRESTO) 24-26 MG Take 1 tablet by mouth 2 (two) times daily. 10/09/22   Milford, Maricela Bo, FNP  Semaglutide,0.25 or 0.5MG /DOS, (OZEMPIC, 0.25 OR 0.5 MG/DOSE,) 2 MG/3ML SOPN Inject 0.25 mg into the skin once a week. 12/20/22   Pavero, Harrell Gave, RPH  spironolactone (ALDACTONE) 25 MG tablet Take 1 tablet (25 mg total) by mouth daily. 11/13/22   Earnie Larsson, NP      Allergies    Esomeprazole magnesium, Ciprofibrate, Ciprofloxacin, Gabapentin, Lubiprostone, Statins, Amlodipine, Iron, Latex, and Povidone iodine    Review of Systems   Review of Systems  Physical Exam Updated Vital Signs There were no vitals taken for this visit. Physical Exam Vitals and nursing note reviewed.  Constitutional:      Appearance: She is well-developed. She is not diaphoretic.  HENT:     Head: Normocephalic and atraumatic.     Mouth/Throat:     Mouth: Mucous membranes are not dry.  Eyes:     Conjunctiva/sclera: Conjunctivae normal.  Neck:     Vascular: Normal carotid pulses. No JVD.     Trachea: Trachea normal. No tracheal deviation.  Cardiovascular:     Rate and Rhythm: Normal rate and regular rhythm. Frequent Extrasystoles are present.    Pulses: No decreased pulses.          Radial pulses are 2+ on the right side and 2+ on the left side.     Heart sounds: Normal heart sounds, S1 normal and S2 normal. No murmur heard. Pulmonary:     Effort: Pulmonary effort is normal. No respiratory distress.     Breath sounds: No wheezing.  Chest:     Chest wall: No tenderness.  Abdominal:     General: Bowel sounds are normal.     Palpations: Abdomen is soft.     Tenderness: There is no abdominal tenderness. There is no guarding or rebound.  Musculoskeletal:        General: Normal range of  motion.     Cervical back: Normal range of motion and neck supple. No muscular tenderness.  Skin:    General: Skin is warm and dry.     Coloration: Skin is not pale.  Neurological:     Mental Status: She is alert.     ED Results / Procedures / Treatments   Labs (all labs ordered are listed, but only abnormal results are displayed) Labs Reviewed - No data to display  EKG None  Radiology No results found.  Procedures Procedures  {Document cardiac monitor, telemetry assessment procedure when appropriate:1}  Medications Ordered in ED Medications - No data to  display  ED Course/ Medical Decision Making/ A&P    Patient seen and examined. History obtained directly from patient.  Reviewed outpatient cardiology notes and spoke directly with EMS in regards to progression of symptoms during transport.    Labs/EKG: Independently reviewed and interpreted.  This included: Reviewed urgent care EKG demonstrating supraventricular tachycardia.  CBC, BMP, troponin, EKG ordered.  Imaging: Chest x-ray ordered  Medications/Fluids: None ordered  Most recent vital signs reviewed and are as follows: BP 99/71   Pulse (!) 106   Temp 97.7 F (36.5 C) (Oral)   Resp 17   SpO2 100%   Initial impression: Supraventricular tachycardia converted to sinus tachycardia    {   Click here for ABCD2, HEART and other calculatorsREFRESH Note before signing :1}                          Medical Decision Making  ***  {Document critical care time when appropriate:1} {Document review of labs and clinical decision tools ie heart score, Chads2Vasc2 etc:1}  {Document your independent review of radiology images, and any outside records:1} {Document your discussion with family members, caretakers, and with consultants:1} {Document social determinants of health affecting pt's care:1} {Document your decision making why or why not admission, treatments were needed:1} Final Clinical Impression(s) / ED  Diagnoses Final diagnoses:  None    Rx / DC Orders ED Discharge Orders     None

## 2022-12-28 ENCOUNTER — Inpatient Hospital Stay (HOSPITAL_COMMUNITY)
Admission: RE | Admit: 2022-12-28 | Discharge: 2022-12-28 | Disposition: A | Discharge status: Home / Self Care | Payer: PPO | Source: Ambulatory Visit | Attending: Internal Medicine | Admitting: Internal Medicine

## 2022-12-28 ENCOUNTER — Ambulatory Visit (HOSPITAL_COMMUNITY)
Admission: RE | Admit: 2022-12-28 | Discharge: 2022-12-28 | Disposition: A | Discharge status: Home / Self Care | Payer: PPO | Source: Ambulatory Visit | Attending: Family Medicine | Admitting: Family Medicine

## 2022-12-28 ENCOUNTER — Encounter (HOSPITAL_COMMUNITY): Payer: Self-pay

## 2022-12-28 VITALS — BP 108/72 | HR 90 | Wt 183.0 lb

## 2022-12-28 DIAGNOSIS — I2510 Atherosclerotic heart disease of native coronary artery without angina pectoris: Secondary | ICD-10-CM | POA: Insufficient documentation

## 2022-12-28 DIAGNOSIS — I454 Nonspecific intraventricular block: Secondary | ICD-10-CM | POA: Diagnosis not present

## 2022-12-28 DIAGNOSIS — I471 Supraventricular tachycardia, unspecified: Secondary | ICD-10-CM

## 2022-12-28 DIAGNOSIS — I25118 Atherosclerotic heart disease of native coronary artery with other forms of angina pectoris: Secondary | ICD-10-CM

## 2022-12-28 DIAGNOSIS — Z6833 Body mass index (BMI) 33.0-33.9, adult: Secondary | ICD-10-CM | POA: Diagnosis not present

## 2022-12-28 DIAGNOSIS — R0683 Snoring: Secondary | ICD-10-CM | POA: Diagnosis not present

## 2022-12-28 DIAGNOSIS — I110 Hypertensive heart disease with heart failure: Secondary | ICD-10-CM | POA: Insufficient documentation

## 2022-12-28 DIAGNOSIS — I5021 Acute systolic (congestive) heart failure: Secondary | ICD-10-CM

## 2022-12-28 DIAGNOSIS — Z79899 Other long term (current) drug therapy: Secondary | ICD-10-CM | POA: Diagnosis not present

## 2022-12-28 DIAGNOSIS — Z7902 Long term (current) use of antithrombotics/antiplatelets: Secondary | ICD-10-CM | POA: Diagnosis not present

## 2022-12-28 DIAGNOSIS — I5022 Chronic systolic (congestive) heart failure: Secondary | ICD-10-CM

## 2022-12-28 DIAGNOSIS — E669 Obesity, unspecified: Secondary | ICD-10-CM | POA: Diagnosis not present

## 2022-12-28 DIAGNOSIS — E118 Type 2 diabetes mellitus with unspecified complications: Secondary | ICD-10-CM | POA: Diagnosis not present

## 2022-12-28 DIAGNOSIS — Z87891 Personal history of nicotine dependence: Secondary | ICD-10-CM | POA: Diagnosis not present

## 2022-12-28 DIAGNOSIS — E1165 Type 2 diabetes mellitus with hyperglycemia: Secondary | ICD-10-CM | POA: Insufficient documentation

## 2022-12-28 DIAGNOSIS — Z7982 Long term (current) use of aspirin: Secondary | ICD-10-CM | POA: Insufficient documentation

## 2022-12-28 DIAGNOSIS — Z955 Presence of coronary angioplasty implant and graft: Secondary | ICD-10-CM | POA: Diagnosis not present

## 2022-12-28 DIAGNOSIS — I252 Old myocardial infarction: Secondary | ICD-10-CM | POA: Diagnosis not present

## 2022-12-28 DIAGNOSIS — I472 Ventricular tachycardia, unspecified: Secondary | ICD-10-CM | POA: Insufficient documentation

## 2022-12-28 DIAGNOSIS — E785 Hyperlipidemia, unspecified: Secondary | ICD-10-CM | POA: Insufficient documentation

## 2022-12-28 DIAGNOSIS — R008 Other abnormalities of heart beat: Secondary | ICD-10-CM | POA: Diagnosis not present

## 2022-12-28 DIAGNOSIS — R002 Palpitations: Secondary | ICD-10-CM | POA: Diagnosis not present

## 2022-12-28 DIAGNOSIS — E782 Mixed hyperlipidemia: Secondary | ICD-10-CM

## 2022-12-28 LAB — COMPREHENSIVE METABOLIC PANEL
ALT: 16 U/L (ref 0–44)
AST: 19 U/L (ref 15–41)
Albumin: 4.2 g/dL (ref 3.5–5.0)
Alkaline Phosphatase: 68 U/L (ref 38–126)
Anion gap: 16 — ABNORMAL HIGH (ref 5–15)
BUN: 23 mg/dL — ABNORMAL HIGH (ref 6–20)
CO2: 22 mmol/L (ref 22–32)
Calcium: 9.7 mg/dL (ref 8.9–10.3)
Chloride: 102 mmol/L (ref 98–111)
Creatinine, Ser: 0.93 mg/dL (ref 0.44–1.00)
GFR, Estimated: >60 mL/min (ref >60)
Glucose, Bld: 94 mg/dL (ref 70–99)
Potassium: 4.4 mmol/L (ref 3.5–5.1)
Sodium: 140 mmol/L (ref 135–145)
Total Bilirubin: 0.7 mg/dL (ref 0.3–1.2)
Total Protein: 7 g/dL (ref 6.5–8.1)

## 2022-12-28 LAB — CBC
HCT: 46.2 % — ABNORMAL HIGH (ref 36.0–46.0)
Hemoglobin: 14.2 g/dL (ref 12.0–15.0)
MCH: 26.7 pg (ref 26.0–34.0)
MCHC: 30.7 g/dL (ref 30.0–36.0)
MCV: 87 fL (ref 80.0–100.0)
Platelets: 334 10*3/uL (ref 150–400)
RBC: 5.31 MIL/uL — ABNORMAL HIGH (ref 3.87–5.11)
RDW: 13.7 % (ref 11.5–15.5)
WBC: 7 10*3/uL (ref 4.0–10.5)
nRBC: 0 % (ref 0.0–0.2)

## 2022-12-28 LAB — MAGNESIUM: Magnesium: 2.2 mg/dL (ref 1.7–2.4)

## 2022-12-28 LAB — TSH: TSH: 1.125 u[IU]/mL (ref 0.350–4.500)

## 2022-12-28 LAB — BRAIN NATRIURETIC PEPTIDE: B Natriuretic Peptide: 232.5 pg/mL — ABNORMAL HIGH (ref 0.0–100.0)

## 2022-12-28 MED ORDER — METOPROLOL SUCCINATE ER 25 MG PO TB24: 25.0000 mg | ORAL_TABLET | Freq: Every day | ORAL | 8 refills | Status: DC

## 2022-12-28 NOTE — Patient Instructions (Addendum)
Thank you for coming in today  Labs were done today, if any labs are abnormal the clinic will call you No news is good news  STOP Coreg  START Toprol XL 25 mg 1 tablet   You have been given instructions for [facility] sleep study and the intructions on when to use and how to return   Can take extra 20 mg - 40 mg  of Lasix for 3 days  Your physician recommends that you schedule a follow-up appointment in:  Keep follow up appointment   Your provider has recommended that  you wear a Zio Patch for 14 days.  This monitor will record your heart rhythm for our review.  IF you have any symptoms while wearing the monitor please press the button.  If you have any issues with the patch or you notice a red or orange light on it please call the company at [phone].  Once you remove the patch please mail it back to the company as soon as possible so we can get the results.     Do the following things EVERYDAY: Weigh yourself in the morning before breakfast. Write it down and keep it in a log. Take your medicines as prescribed Eat low salt foods--Limit salt (sodium) to 2000 mg per day.  Stay as active as you can everyday Limit all fluids for the day to less than 2 liters   At the [facility], you and your health needs are our priority. As part of our continuing mission to provide you with exceptional heart care, we have created designated Provider Care Teams. These Care Teams include your primary Cardiologist (physician) and Advanced Practice Providers (APPs- Physician Assistants and Nurse Practitioners) who all work together to provide you with the care you need, when you need it.   You may see any of the following providers on your designated Care Team at your next follow up: Dr Glori Bickers Dr Loralie Champagne Dr. Roxana Hires, NP Lyda Jester, [place] [facility] [place], [place] Forestine Na, NP Audry Riles, [name]   Please be sure to bring in all  your medications bottles to every appointment.    Thank you for choosing [facility]   If you have any questions or concerns before your next appointment please send [place] a message through [place] or call our office at [phone].    TO LEAVE A MESSAGE FOR THE NURSE SELECT OPTION 2, PLEASE LEAVE A MESSAGE INCLUDING: YOUR NAME DATE OF BIRTH CALL BACK NUMBER REASON FOR CALL**this is important as we prioritize the call backs  YOU WILL RECEIVE A CALL BACK THE SAME DAY AS LONG AS YOU CALL BEFORE 4:00 PM

## 2022-12-28 NOTE — Progress Notes (Signed)
Patient provided Home sleep device and instructions to complete the study. She will await a call from me to proceed in order that our office can check for ins authorization.

## 2022-12-28 NOTE — Progress Notes (Signed)
ReDS Vest / Clip - 12/28/22 1140       ReDS Vest / Clip   Station Marker D    Ruler Value 35    ReDS Value Range Moderate volume overload    ReDS Actual Value 37    Anatomical Comments sitting

## 2022-12-28 NOTE — Progress Notes (Addendum)
Advanced Heart Failure Clinic Note  Date:  01/02/2021   ID:  Julie Hernandez, DOB [birth year], MRN [id number]  Location: Home  Provider location: [place] [facility] Type of Visit: Established patient  PCP:  Julie Mainland, FNP  HF Cardiologist: Dr. Haroldine Laws  Chief Complaint: Heart Failure follow-up   HPI: Julie Hernandez is a 52 y.o. woman with COPD/ongoing tobacco abuse, premature CAD s/p anterior MI, diet-controlled DM2, chronic back pain, and systolic HF due to [facility] referred for further evaluation of CAD and systolic HF.  Had anterior MI in 2010 taken emergently to [facility]. Stent was placed emergently. Post stenting she continued to have CP. Went to [facility] that same year and had  a second stent placed. Did well from a cardiac perspective until 3/21.   Cath 02/08/20 at [facility] for NSTEMI. 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 Was told she may need repeat stenting vs CABG. Referred here for second opinion.   We saw her in 5/21 with daily angina. Underwent MRI which showed EF 26% only minimal viability in anterior wall. Decision to proceed with PCI of LCX and LAD (ISR) over CABG. Had successful PCI of LCX and LAD with Dr. Burt Knack on 5/20.   Echo 9/21 EF 60-65%.  Echo 04/06/21 EF 50-55% mild anterior and apical HK  Admitted 10/23 with NSTEMI. Echo showed EF back down to 20% and RV mildly reduced. AHF consulted and underwent R/LHC which showed elevated right and left filling pressures and low CO;  occluded LAD stent and new CTO of RCA with L>>R collaterals. Underwent staged PCI with DES to mid LAD. Diuresed with IV lasix and eventual transition to GDMT, no SGLT2i with poorly controlled DM. Plan for ASA + Plavix at least 12 month, ideally long-term though. Discharged home, weight 190 lbs.  Follow up 1/24, improved NYHA II  and volume stable. Remained off SGLT2i with uncontrolled DM.  Seen in ED 12/25/22 with SV, no syncope or LOC. Resolved with vagal maneuvers and did not require adenosine. HR 100-110 in ED. BP soft and Coreg and Entresto held x 1 day.  Today she returns for post ED evaluation HF follow up. Overall feeling fine, anxious about recent ED visit. She wonders if it was related to starting Ozempic. Feeling palpitations occasionally. She is not SOB walking on flat ground and continues to do CR 3x/week.  She had chest ache yesterday but did not require nitro. Denies syncope, abnormal bleeding, dizziness, edema, or PND/Orthopnea. Appetite ok. No fever or chills. Weight at home 183 pounds. Taking all medications. Continues to vape 3-4x/days, no drugs or ETOH. She thinks she may snore.  Cardiac Studies - R/LHC (10/23): RA 23 mean, PA 48/35 mean 39, PCWP mean 30, CO/CI (Fick) 3.7/2 Severe 2v CAD with 90% in stent re-stenonsis of mid LAD and CTO of mid RCA with L>>R collaterals  - Echo (10/23): EF 20%, RV mildly down.  - Echo (5/22): EF 50-55%, mid anterior and apical HK  - Echo (9/21): EF 60-65%  - Zio (7/21) 1. Sinus rhythm - avg HR of 82 2. 13 Ventricular Tachycardia runs occurred, the run with the fastest interval lasting 12 beats with a max rate of 139 bpm (avg 110 bpm); the run with the fastest interval was also the longest 3. Very frequents PVCs (32.2%, [id number]), VE  Couplets were rare (<1.0%, 2118),  4. Ventricular Bigeminy and Trigeminy were present. 5.. Multiple patient-triggered events associated  - cMRI (03/29/20) 1. Subendocardial late gadolinium enhancement consistent with prior infarct in the LAD territory. There is >50% transmural LGE suggesting nonviability in the basal to apical anterior wall and apex. There is <50% transmural LGE suggesting viability in the basal to mid anteroseptum and apical septum  2.  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 Normal LV size with severe systolic  dysfunction (EF 26%). Akinesis of basal to apical anterior/anteroseptal walls and apex   4.  Small RV size with normal systolic function (EF [id number] Past Medical History:  Diagnosis Date   CHF (congestive heart failure) (HCC)    Coronary artery disease    COVID 12/03/2020   Diabetes mellitus without complication (HCC)    Hypertension    Current Outpatient Medications  Medication Sig Dispense Refill   albuterol (VENTOLIN HFA) 108 (90 Base) MCG/ACT inhaler Inhale 2 puffs into the lungs every 4 (four) hours as needed for wheezing or shortness of breath.     aspirin EC 81 MG tablet Take 1 tablet (81 mg total) by mouth daily. 30 tablet 11   blood glucose meter kit and supplies Dispense based on patient and insurance preference. Use up to four times daily as directed. (FOR ICD-10 E10.9, E11.9). 1 each 1   clopidogrel (PLAVIX) 75 MG tablet Take 1 tablet (75 mg total) by mouth daily. 30 tablet 11   Continuous Blood Gluc Sensor (FREESTYLE LIBRE 3 SENSOR) MISC Change sensor every 14 days     escitalopram (LEXAPRO) 10 MG tablet Take 10 mg by mouth daily.     Evolocumab (REPATHA SURECLICK) [id number]/ML SOAJ Inject 140 mg into the skin every 14 (fourteen) days. 6 mL 3   ezetimibe (ZETIA) 10 MG tablet Take 1 tablet (10 mg total) by mouth daily at 6pm. (Patient taking differently: Take 10 mg by mouth daily.) 30 tablet 11   famotidine (PEPCID) 40 MG tablet Take 40 mg by mouth daily as needed for heartburn.     furosemide (LASIX) 20 MG tablet Take 3 tablets (60 mg total) by mouth daily. . 270 tablet 3   insulin glargine (LANTUS) 100 UNIT/ML Solostar Pen Inject 22 Units into the skin daily. 15 mL 0   Insulin Pen Needle 32G X 4 MM MISC Use with insulin pen 100 each 0   metFORMIN (GLUCOPHAGE) 500 MG tablet Take 2 tablets (1,000 mg total) by mouth 2 (two) times daily with a meal. 360 tablet 3   metoprolol succinate (TOPROL XL) 25 MG 24 hr tablet Take 1 tablet (25 mg total) by mouth daily. 30 tablet 8    nitroGLYCERIN (NITROSTAT) 0.4 MG SL tablet PLACE 1 TABLET UNDER THE TONGUE AND ALLOW TO DISSOLVE SLOWLY, DO NOT CHEW OR SWALLOW. YOU MAY USE ADDITIONAL TABLETS EVERY 5 MINUTES BUT NO MORE THAN 3 TABLETS. 25 tablet 1   ondansetron (ZOFRAN ODT) 4 MG disintegrating tablet Take 1 tablet (4 mg total) by mouth every 8 (eight) hours as needed for nausea or vomiting. 10 tablet 0   potassium chloride SA (KLOR-CON M) 20 MEQ tablet Take 1 tablet (20 mEq total) by mouth daily. 90 tablet 3   promethazine-dextromethorphan (PROMETHAZINE-DM) 6.25-15 MG/5ML syrup Take 5 mLs by mouth daily as needed for cough.     RABEprazole (ACIPHEX) 20 MG tablet Take 40 mg by mouth daily.     sacubitril-valsartan (ENTRESTO) 24-26 MG Take 1  tablet by mouth 2 (two) times daily. 60 tablet 11   Semaglutide,0.25 or 0.5MG/DOS, (OZEMPIC, 0.25 OR 0.5 MG/DOSE,) 2 MG/3ML SOPN Inject 0.25 mg into the skin once a week. 3 mL 0   spironolactone (ALDACTONE) 25 MG tablet Take 1 tablet (25 mg total) by mouth daily. 30 tablet 5   No current facility-administered medications for this encounter.   Allergies  Allergen Reactions   Esomeprazole Magnesium Anaphylaxis   Ciprofibrate Itching   Ciprofloxacin Itching   Gabapentin Other (See Comments)    Achy - myalgia   Lubiprostone Diarrhea   Statins     Myalgia - flu like symptoms   Amlodipine Rash   Iron Rash and Swelling   Latex Rash and Swelling   Povidone Iodine Rash   Social History   Socioeconomic History   Marital status: Divorced    Spouse name: Not on file   Number of children: 2   Years of education: Not on file   Highest education level: Not on file  Occupational History   Occupation: On disability  Tobacco Use   Smoking status: Former    Types: Cigarettes    Quit date: 02/18/2020    Years since quitting: 2.8   Smokeless tobacco: Never  Vaping Use   Vaping Use: Some days  Substance and Sexual Activity   Alcohol use: Yes    Comment: SOCIAL   Drug use: Never   Sexual  activity: Not on file  Other Topics Concern   Not on file  Social History Narrative   Not on file   Social Determinants of Health   Financial Resource Strain: Not on file  Food Insecurity: Not on file  Transportation Needs: Not on file  Physical Activity: Not on file  Stress: Not on file  Social Connections: Not on file  Intimate Partner Violence: Not on file   FHx: M died at 69 from MI and HF D had stroke No brother and sisters  BP 108/72   Pulse 90   Wt 83 kg (183 lb)   SpO2 100%   BMI 33.47 kg/m   Wt Readings from Last 3 Encounters:  12/28/22 83 kg (183 lb)  12/20/22 85.3 kg (188 lb)  11/29/22 86.5 kg (190 lb 9.6 oz)   PHYSICAL EXAM General:  NAD. No resp difficulty, walked into clinic HEENT: Normal Neck: Supple. No JVD, thick neck. Carotids 2+ bilat; no bruits. No lymphadenopathy or thryomegaly appreciated. Cor: PMI nondisplaced. Regular rate & rhythm. No rubs, gallops or murmurs. Lungs: Clear Abdomen: Soft, nontender, nondistended. No hepatosplenomegaly. No bruits or masses. Good bowel sounds. Extremities: No cyanosis, clubbing, rash, edema Neuro: Alert & oriented x 3, cranial nerves grossly intact. Moves all 4 extremities w/o difficulty. Affect pleasant.  ECG (personally reviewed): NSR, 88 bpm QRS 132 msec  ASSESSMENT & PLAN: 1. CAD - prior anterior MI w/ h/o PCI to mLAD and mLCX - Echo w/ drop in EF, from 55% >> 20%, RV mildly reduced - Cath (10/23): 90% in-stent restenosis of mLAD and CTO mRCA with left-to-right collaterals. LCx stent widely patent.  - S/P PCI DES mid LAD. - No ischemic CP - Continue Plavix 75 mg daily. - Plan aspirin + clopidogrel at least 12 months, but hopefully long term DAPT. - Continue Repatha - Continue CR.   2. Chronic Systolic Heart Failure - Previous history of ICM following prior anterior MI.  - EF previously 25-30%, improved post LAD and LCx PCI - Echo (9/21): EF 60-65% - Echo (04/06/21): EF  50-55% mild anterior and  apical HK - Echo (10/23): EF down 20%, RV mildly reduced in setting of LAD and RCA infarcts, previously placed mLAD stent w/ 99% stenosis, CTO mRCA w/ L>R collaterals  - RHC (10/23):  w/ elevated R+ L filling pressures and low output, RA 23, PCWP 30, CI 2.0  - Improved NYHA II, Volume looks OK on exam, ReDs 37%.  - Stop Coreg. - Start Toprol XL 25 mg daily. - Continue Entresto 24/26 mg bid.  - Continue Lasix 40 mg daily. Discussed taking extra PRN for 3 days. - Continue spironolactone 25 mg daily.   - A1c improving, consider adding SGLT2i next visit.  - Labs today.  - Repeat echo next visit. Refer to EP if EF < 35%, QRs 132 msec on ECG today.  3. SVT - Zio (05/2020) showed mostly SR, frequent PVCs (32% burden), 13 runs of VT - Place Zio Live 2 week to quantify arrhythmias.  - No indication for LifeVest as she did not have syncope/LOC. Discussed with Dr. Aundra Dubin - Switch Coreg to Toprol as above. - Arrange sleep study. - Labs today. - Await echo in a couple weeks, consider referral to EP if EF < 35% and discuss EPS for SVT ablation.   4. Type 2DM - poorly controlled, in setting of poor compliance w/ regimen - Hgb A1c 13.1 (11/23)--> 8.5 (1/24) - Consider adding back SGLT2i next visit. - She has been referred to Endocrinology, has seen Dr. Cruzita Lederer in the past. Unfortunately 1st available is not until 07/2023. Will see if we can refer elsewhere.   5. HLD w/ LDL Goal < 55 - Statin intolerant. - Continue Repatha + Zetia - LDL 86 on admit  - May need trial of very low dose statin as adjunct.    6. H/o Tobacco Use - Quit cigarettes, currently vapes  - Discussed cessation. - Probably needs PFTs, defer to PCP.  7. Obesity - Body mass index is 33.47 kg/m. - Now on Ozempic.  8. Snoring - Arrange home sleep study.  Follow up in 3 weeks with Dr. Haroldine Laws + echo, as scheduled.  Allena Katz, FNP-BC 12/28/22

## 2022-12-28 NOTE — Progress Notes (Signed)
Patient Name: Julie Hernandez        DOB: 1970/11/23      Height: 60f 2in    Weight: 183 lbs  Office Name: Advance heart failure          Referring Provider: JAllena KatzNP  Today's Date: 12/28/2022  Date: 12/28/2022   STOP BANG RISK ASSESSMENT S (snore) Have you been told that you snore?     YES   T (tired) Are you often tired, fatigued, or sleepy during the day?   YES  O (obstruction) Do you stop breathing, choke, or gasp during sleep? NO   P (pressure) Do you have or are you being treated for high blood pressure? YES   B (BMI) Is your body index greater than 35 kg/m? NO   A (age) Are you 89years old or older? YES   N (neck) Do you have a neck circumference greater than 16 inches?   NO   G (gender) Are you a female? NO   TOTAL STOP/BANG "YES" ANSWERS 4                                                                       For Office Use Only              Procedure Order Form    YES to 3+ Stop Bang questions OR two clinical symptoms - patient qualifies for WatchPAT (CPT 95800)             Clinical Notes: Will consult Sleep Specialist and refer for management of therapy due to patient increased risk of Sleep Apnea. Ordering a sleep study due to the following two clinical symptoms: Excessive daytime sleepiness G47.10 / Gastroesophageal reflux K21.9 / Nocturia R35.1 / Morning Headaches G44.221 / Difficulty concentrating R41.840 / Memory problems or poor judgment G31.84 / Personality changes or irritability R45.4 / Loud snoring R06.83 / Depression F32.9 / Unrefreshed by sleep G47.8 / Impotence N52.9 / History of high blood pressure R03.0 / Insomnia G47.00    I understand that I am proceeding with a home sleep apnea test as ordered by my treating physician. I understand that untreated sleep apnea is a serious cardiovascular risk factor and it is my responsibility to perform the test and seek management for sleep apnea. I will be contacted with the results and be managed for sleep  apnea by a local sleep physician. [facility] I shall promptly ship back the equipment via the included mailing label. I understand my insurance will be billed for the test and as the patient I am responsible for any insurance related out-of-pocket costs incurred. I have been provided with written instructions and can call for additional video or telephonic instruction, with 24-hour availability of qualified personnel to answer any questions: Patient Help Desk [phone]  Patient Signature ______________________________________________________   Date______________________ Patient Telemedicine Verbal Consent

## 2023-01-01 ENCOUNTER — Telehealth (HOSPITAL_COMMUNITY): Payer: Self-pay | Admitting: Surgery

## 2023-01-01 NOTE — Telephone Encounter (Signed)
I called patient to let her know that she can proceed with ordered home sleep study as insurance prior Josem Kaufmann is not required.  She says that she will complete the study this week.

## 2023-01-03 ENCOUNTER — Encounter: Payer: PPO | Attending: Internal Medicine | Admitting: Dietician

## 2023-01-03 ENCOUNTER — Encounter: Payer: Self-pay | Admitting: Dietician

## 2023-01-03 DIAGNOSIS — E1169 Type 2 diabetes mellitus with other specified complication: Secondary | ICD-10-CM | POA: Diagnosis present

## 2023-01-03 DIAGNOSIS — Z794 Long term (current) use of insulin: Secondary | ICD-10-CM | POA: Diagnosis present

## 2023-01-03 NOTE — Patient Instructions (Signed)
Aim for 150 minutes of physical activity weekly. Continue going to cardiac rehab. Goal: Walk for 20 minutes 2x/wk on your off days.    Aim to eat within 1-2 hours of waking up and every 3-5 hours following.  Goal: eat breakfast 3 days a week. Aim for a protein + carbohydrate. Examples: protein shake and fruit, boiled egg on avocado whole wheat toast   Goal: aim to make 1/2 of your plate vegetables, 1/4 protein, and 1/4 carb at least 1x/day

## 2023-01-03 NOTE — Progress Notes (Signed)
Diabetes Self-Management Education  Visit Type: Follow-up  Appt. Start Time: 1400 Appt. End Time: [id number]  01/03/2023  [name], identified by name and date of birth, is a 52 y.o. female with a diagnosis of Diabetes:  .   ASSESSMENT  History includes: type 2 diabetes, CHF, HTN, HLD.  Labs noted: 11/29/22 A1c 8.5% down from 09/18/22 A1c 13.1% Medications include: metformin, insulin glargine, ozempic Supplements: sublingual vitamin B12, vitamin D3 CGM: Freestyle Libre 3, 94% time in range x30 days Wt 10/31/22: 188lbs, pt report Wt 01/03/23 180 lbs, pt report (from cardiac rehab)   Pt's A1c has gone down from 13.1 to 8.5 since the last visit.   Pt states she has started ozempic recently.   Pt states she has had some hypoglycemic episodes, mostly in the middle of the night. She states her blood glucose was 67m/dL but she felt no symptoms but she drank orange juice and it helped it return to normal.   Pt has still be going to cardiac rehab 3x/wk and pt goes walking.   Pt states she has been eating mostly protein/veggie bowls and salads with chicken.   There were no vitals taken for this visit. There is no height or weight on file to calculate BMI.   Diabetes Self-Management Education - 01/03/23 1401       Visit Information   Visit Type Follow-up      Health Coping   How would you rate your overall health? Good      Psychosocial Assessment   Patient Belief/Attitude about Diabetes Motivated to manage diabetes    What is the hardest part about your diabetes right now, causing you the most concern, or is the most worrisome to you about your diabetes?   Making healty food and beverage choices    Self-care barriers None    Self-management support Doctor's office    Other persons present Patient    Patient Concerns Nutrition/Meal planning    Special Needs None    Preferred Learning Style No preference indicated    Learning Readiness Ready      Pre-Education Assessment    Patient understands the diabetes disease and treatment process. Needs Review    Patient understands incorporating nutritional management into lifestyle. Needs Review    Patient undertands incorporating physical activity into lifestyle. Needs Review    Patient understands using medications safely. Needs Review    Patient understands monitoring blood glucose, interpreting and using results Needs Review    Patient understands prevention, detection, and treatment of acute complications. Needs Review    Patient understands prevention, detection, and treatment of chronic complications. Needs Review    Patient understands how to develop strategies to address psychosocial issues. Needs Review    Patient understands how to develop strategies to promote health/change behavior. Needs Review      Complications   Last HgB A1C per patient/outside source 8.5 %    How often do you check your blood sugar? > 4 times/day    Fasting Blood glucose range (mg/dL) 70-129    Postprandial Blood glucose range (mg/dL) 130-179    Are you checking your feet? Yes      Dietary Intake   Breakfast skips OR 1/2 protein shake    Snack (morning) none    Lunch protein bowl with cottage cheese, chicken, vegetables and low carb tortilla OR salad with veggies and chicken and cheese    Snack (afternoon) none OR avocado    Dinner protein bowl with cottage cheese, chicken,  vegetables and low carb tortilla OR salad with veggies and chicken and cheese    Snack (evening) none    Beverage(s) 2089m fluid restriction, diet pepsi      Activity / Exercise   Activity / Exercise Type Light (walking / raking leaves)    How many days per week do you exercise? 3    How many minutes per day do you exercise? 30    Total minutes per week of exercise 90      Patient Education   Previous Diabetes Education Yes (please comment)    Disease Pathophysiology Explored patient's options for treatment of their diabetes    Healthy Eating Role of diet  in the treatment of diabetes and the relationship between the three main macronutrients and blood glucose level;Plate Method;Carbohydrate counting;Reviewed blood glucose goals for pre and post meals and how to evaluate the patients' food intake on their blood glucose level.;Meal timing in regards to the patients' current diabetes medication.;Information on hints to eating out and maintain blood glucose control.;Meal options for control of blood glucose level and chronic complications.    Being Active Role of exercise on diabetes management, blood pressure control and cardiac health.    Medications Taught/reviewed insulin/injectables, injection, site rotation, insulin/injectables storage and needle disposal.;Reviewed patients medication for diabetes, action, purpose, timing of dose and side effects.    Monitoring Taught/evaluated CGM (comment)    Acute complications Taught prevention, symptoms, and  treatment of hypoglycemia - the 15 rule.    Chronic complications Relationship between chronic complications and blood glucose control;Lipid levels, blood glucose control and heart disease;Identified and discussed with patient  current chronic complications    Diabetes Stress and Support Identified and addressed patients feelings and concerns about diabetes    Lifestyle and Health Coping Lifestyle issues that need to be addressed for better diabetes care      Individualized Goals (developed by patient)   Nutrition General guidelines for healthy choices and portions discussed    Physical Activity Exercise 3-5 times per week;30 minutes per day    Medications take my medication as prescribed    Monitoring  Consistenly use CGM    Problem Solving Eating Pattern    Reducing Risk examine blood glucose patterns;do foot checks daily;treat hypoglycemia with 15 grams of carbs if blood glucose less than 728mdL    Health Coping Ask for help with psychological, social, or emotional issues      Patient Self-Evaluation  of Goals - Patient rates self as meeting previously set goals (% of time)   Nutrition 50 - 75 % (half of the time)    Physical Activity 50 - 75 % (half of the time)    Medications >75% (most of the time)    Monitoring >75% (most of the time)    Problem Solving and behavior change strategies  50 - 75 % (half of the time)    Reducing Risk (treating acute and chronic complications) 50 - 75 % (half of the time)    Health Coping 50 - 75 % (half of the time)      Post-Education Assessment   Patient understands the diabetes disease and treatment process. Comprehends key points    Patient understands incorporating nutritional management into lifestyle. Comprehends key points    Patient undertands incorporating physical activity into lifestyle. Comprehends key points    Patient understands using medications safely. Demonstrates understanding / competency    Patient understands monitoring blood glucose, interpreting and using results Demonstrates understanding / competency  Patient understands prevention, detection, and treatment of acute complications. Demonstrates understanding / competency    Patient understands prevention, detection, and treatment of chronic complications. Comprehends key points    Patient understands how to develop strategies to address psychosocial issues. Comprehends key points    Patient understands how to develop strategies to promote health/change behavior. Comprehends key points      Outcomes   Expected Outcomes Demonstrated interest in learning. Expect positive outcomes    Future DMSE 3-4 months    Program Status Not Completed      Subsequent Visit   Since your last visit have you continued or begun to take your medications as prescribed? Yes    Since your last visit have you had your blood pressure checked? No    Since your last visit have you experienced any weight changes? Loss   8 lbs   Weight Loss (lbs) 8    Since your last visit, are you checking your blood  glucose at least once a day? Yes             Individualized Plan for Diabetes Self-Management Training:   Learning Objective:  Patient will have a greater understanding of diabetes self-management. Patient education plan is to attend individual and/or group sessions per assessed needs and concerns.   Plan:   Patient Instructions  Aim for 150 minutes of physical activity weekly. Continue going to cardiac rehab. Goal: Walk for 20 minutes 2x/wk on your off days.    Aim to eat within 1-2 hours of waking up and every 3-5 hours following.  Goal: eat breakfast 3 days a week. Aim for a protein + carbohydrate. Examples: protein shake and fruit, boiled egg on avocado whole wheat toast   Goal: aim to make 1/2 of your plate vegetables, 1/4 protein, and 1/4 carb at least 1x/day  Expected Outcomes:  Demonstrated interest in learning. Expect positive outcomes  Education material provided: Meal plan card and Snack sheet  If problems or questions, patient to contact team via:  Phone  Future DSME appointment: 3-4 months

## 2023-01-04 ENCOUNTER — Telehealth (HOSPITAL_COMMUNITY): Payer: Self-pay | Admitting: *Deleted

## 2023-01-04 ENCOUNTER — Telehealth: Payer: Self-pay

## 2023-01-04 NOTE — Telephone Encounter (Signed)
Received fax from cardiac rehab. Reviewed with Dr. Haroldine Laws per his instruction patient is to stop Entresto and change Lasix to PRN. I will call patient to give her new medication instructions. Left message for patient to call office back

## 2023-01-04 NOTE — Telephone Encounter (Signed)
        Patient  visited [place] on 2/6   Telephone encounter attempt :  1st  A HIPAA compliant voice message was left requesting a return call.  Instructed patient to call back    [facility] [phone] [address], [place], [place] [address] Phone: [phone] Email: [email or web address]

## 2023-01-04 NOTE — Addendum Note (Signed)
Encounter addended by: Rafael Bihari, FNP on: 01/04/2023 2:17 PM  Actions taken: Clinical Note Signed

## 2023-01-04 NOTE — Telephone Encounter (Signed)
Pt called stating she was not able to participate in cardiac rehab today because her bp was low before exercise. Bp was 70's/50's. They were able to get her bo to 80/64 before leaving. Pt said she feels fine no dizziness or light headedness.  Pt asked if she needed to hold her pm meds or decrease any meds.   Routed to [employer] for advice

## 2023-01-07 ENCOUNTER — Telehealth: Payer: Self-pay

## 2023-01-07 NOTE — Telephone Encounter (Signed)
     Patient  visit on 2/6  at [facility]    Have you been able to follow up with your primary care physician? Yes    The patient was or was not able to obtain any needed medicine or equipment. Yes   Are there diet recommendations that you are having difficulty following? Na    Patient expresses understanding of discharge instructions and education provided has no other needs at this time.  Yes      [facility] [phone] [address], [place], [place] [address] Phone: [phone] Email: [email or web address]

## 2023-01-07 NOTE — Telephone Encounter (Signed)
Patient has called back and stated her BP was low in rehab again today and she has been experiencing dizziness. I informed her of medication changes and she verbalized understanding, She will reach out if any issues.

## 2023-01-08 ENCOUNTER — Encounter (INDEPENDENT_AMBULATORY_CARE_PROVIDER_SITE_OTHER): Payer: PPO | Admitting: Cardiology

## 2023-01-08 DIAGNOSIS — G4733 Obstructive sleep apnea (adult) (pediatric): Secondary | ICD-10-CM

## 2023-01-09 ENCOUNTER — Ambulatory Visit: Payer: PPO | Attending: Family Medicine

## 2023-01-09 NOTE — Procedures (Signed)
SLEEP STUDY REPORT Patient Information Study Date: 01/09/2023 Patient Name: Julie Hernandez Patient ID: [id number] Birth Date: 1971-05-03 Age: 52 Gender: Female BMI: 33.7 (W=183 lb, H=5' 2'') Stopbang: 4 Referring Physician: Allena Katz, NP  TEST DESCRIPTION:  Home sleep apnea testing was completed using the WatchPat, a Type 1 device, utilizing peripheral arterial tonometry (PAT), chest movement, actigraphy, pulse oximetry, pulse rate, body position and snore.  AHI was calculated with apnea and hypopnea using valid sleep time as the denominator. RDI includes apneas, hypopneas, and RERAs.  The data acquired and the scoring of sleep and all associated events were performed in accordance with the recommended standards and specifications as outlined in the AASM Manual for the Scoring of Sleep and Associated Events 2.2.0 (2015).  FINDINGS:  1.  Moderate Obstructive Sleep Apnea with AHI 20.6/hr.   2.  No Central Sleep Apnea with pAHIc 1.6/hr.  3.  Oxygen desaturations as low as 80%.  4.  Mild snoring was present. O2 sats were < 88% for 2.4 min.  5.  Total sleep time was 6 hrs and 7 min.  6.  24.7% of total sleep time was spent in REM sleep.   7.  Normal sleep onset latency at 19 min  8.  Prolonged REM sleep onset latency at 128 min.   9.  Total awakenings were9 .  10. Arrhythmia detection:  Suggestive of possible brief atrial fibrillation lasting 2 min 42 seconds.  This is not diagnostic and further testing with outpatient telemetry monitoring is recommended.  DIAGNOSIS:   Moderate Obstructive Sleep Apnea (G47.33) Possible Atrial Arrhythmias RECOMMENDATIONS:   1.  Clinical correlation of these findings is necessary.  The decision to treat obstructive sleep apnea (OSA) is usually based on the presence of apnea symptoms or the presence of associated medical conditions such as Hypertension, Congestive Heart Failure, Atrial Fibrillation or Obesity.  The most common symptoms of OSA  are snoring, gasping for breath while sleeping, daytime sleepiness and fatigue.   2.  Initiating apnea therapy is recommended given the presence of symptoms and/or associated conditions. Recommend proceeding with one of the following:     a.  Auto-CPAP therapy with a pressure range of 5-20cm H2O.     b.  An oral appliance (OA) that can be obtained from certain dentists with expertise in sleep medicine.  These are primarily of use in non-obese patients with mild and moderate disease.     c.  An ENT consultation which may be useful to look for specific causes of obstruction and possible treatment options.     d.  If patient is intolerant to PAP therapy, consider referral to ENT for evaluation for hypoglossal nerve stimulator.   3.  Close follow-up is necessary to ensure success with CPAP or oral appliance therapy for maximum benefit.  4.  A follow-up oximetry study on CPAP is recommended to assess the adequacy of therapy and determine the need for supplemental oxygen or the potential need for Bi-level therapy.  An arterial blood gas to determine the adequacy of baseline ventilation and oxygenation should also be considered.  5.  Healthy sleep recommendations include:  adequate nightly sleep (normal 7-9 hrs/night), avoidance of caffeine after noon and alcohol near bedtime, and maintaining a sleep environment that is cool, dark and quiet.  6.  Weight loss for overweight patients is recommended.  Even modest amounts of weight loss can significantly improve the severity of sleep apnea.  7.  Snoring recommendations include:  weight loss where appropriate, side sleeping, and avoidance of alcohol before bed.  8.  Operation of motor vehicle should not be performed when sleepy.  9.  Consider outpatient event monitor to assess for silent atrial arrhythmias if clinically indicated.  Signature:   Fransico Him, MD; [facility]; [place], [place] Board of Sleep Medicine Electronically Signed:  01/09/2023

## 2023-01-10 ENCOUNTER — Telehealth: Payer: Self-pay | Admitting: *Deleted

## 2023-01-10 DIAGNOSIS — I25118 Atherosclerotic heart disease of native coronary artery with other forms of angina pectoris: Secondary | ICD-10-CM

## 2023-01-10 DIAGNOSIS — G4733 Obstructive sleep apnea (adult) (pediatric): Secondary | ICD-10-CM

## 2023-01-10 NOTE — Telephone Encounter (Signed)
The patient has been notified of the result. Left detailed message on voicemail and informed patient to call back..Wayland Baik Green, CMA   

## 2023-01-10 NOTE — Telephone Encounter (Signed)
-----   Message from Lauralee Evener, [place] sent at 01/10/2023 11:43 AM EST -----  ----- Message ----- From: Sueanne Margarita, MD Sent: 01/09/2023   1:29 PM EST To: Cv Div Sleep Studies  Please let patient know that they have sleep apnea and recommend treating with CPAP.  Please order an auto CPAP from 4-15cm H2O with heated humidity and mask of choice.  Order overnight pulse ox on CPAP.  Followup with me in 6 weeks.

## 2023-01-10 NOTE — Addendum Note (Signed)
Addended by: Freada Bergeron on: 01/10/2023 06:43 PM   Modules accepted: Orders

## 2023-01-11 ENCOUNTER — Encounter (HOSPITAL_COMMUNITY): Payer: Self-pay | Admitting: Internal Medicine

## 2023-01-11 ENCOUNTER — Ambulatory Visit (HOSPITAL_COMMUNITY)
Admission: RE | Admit: 2023-01-11 | Discharge: 2023-01-11 | Disposition: A | Discharge status: Home / Self Care | Payer: PPO | Source: Ambulatory Visit | Attending: Internal Medicine | Admitting: Internal Medicine

## 2023-01-11 ENCOUNTER — Ambulatory Visit (HOSPITAL_BASED_OUTPATIENT_CLINIC_OR_DEPARTMENT_OTHER)
Admission: RE | Admit: 2023-01-11 | Discharge: 2023-01-11 | Disposition: A | Discharge status: Home / Self Care | Payer: PPO | Source: Ambulatory Visit | Attending: Internal Medicine | Admitting: Internal Medicine

## 2023-01-11 ENCOUNTER — Other Ambulatory Visit (HOSPITAL_COMMUNITY): Payer: Self-pay

## 2023-01-11 VITALS — BP 90/60 | HR 77 | Wt 182.8 lb

## 2023-01-11 DIAGNOSIS — I5022 Chronic systolic (congestive) heart failure: Secondary | ICD-10-CM

## 2023-01-11 DIAGNOSIS — I252 Old myocardial infarction: Secondary | ICD-10-CM | POA: Diagnosis not present

## 2023-01-11 DIAGNOSIS — F17290 Nicotine dependence, other tobacco product, uncomplicated: Secondary | ICD-10-CM | POA: Insufficient documentation

## 2023-01-11 DIAGNOSIS — I110 Hypertensive heart disease with heart failure: Secondary | ICD-10-CM | POA: Diagnosis not present

## 2023-01-11 DIAGNOSIS — I2510 Atherosclerotic heart disease of native coronary artery without angina pectoris: Secondary | ICD-10-CM | POA: Insufficient documentation

## 2023-01-11 DIAGNOSIS — I471 Supraventricular tachycardia, unspecified: Secondary | ICD-10-CM | POA: Diagnosis not present

## 2023-01-11 DIAGNOSIS — G4733 Obstructive sleep apnea (adult) (pediatric): Secondary | ICD-10-CM | POA: Diagnosis not present

## 2023-01-11 DIAGNOSIS — E1165 Type 2 diabetes mellitus with hyperglycemia: Secondary | ICD-10-CM | POA: Diagnosis not present

## 2023-01-11 DIAGNOSIS — Z6833 Body mass index (BMI) 33.0-33.9, adult: Secondary | ICD-10-CM | POA: Insufficient documentation

## 2023-01-11 DIAGNOSIS — Z955 Presence of coronary angioplasty implant and graft: Secondary | ICD-10-CM | POA: Diagnosis not present

## 2023-01-11 DIAGNOSIS — I493 Ventricular premature depolarization: Secondary | ICD-10-CM | POA: Insufficient documentation

## 2023-01-11 DIAGNOSIS — E782 Mixed hyperlipidemia: Secondary | ICD-10-CM

## 2023-01-11 DIAGNOSIS — I472 Ventricular tachycardia, unspecified: Secondary | ICD-10-CM | POA: Insufficient documentation

## 2023-01-11 DIAGNOSIS — E669 Obesity, unspecified: Secondary | ICD-10-CM | POA: Diagnosis not present

## 2023-01-11 DIAGNOSIS — Z87891 Personal history of nicotine dependence: Secondary | ICD-10-CM

## 2023-01-11 DIAGNOSIS — Z79899 Other long term (current) drug therapy: Secondary | ICD-10-CM | POA: Diagnosis not present

## 2023-01-11 LAB — ECHOCARDIOGRAM COMPLETE
Area-P 1/2: 5.31 cm2
MV M vel: 3.75 m/s
MV Peak grad: 56.3 mmHg
S' Lateral: 5.4 cm

## 2023-01-11 MED ORDER — FUROSEMIDE 20 MG PO TABS: 60.0000 mg | ORAL_TABLET | ORAL | 3 refills | Status: DC | PRN

## 2023-01-11 MED ORDER — EMPAGLIFLOZIN 10 MG PO TABS: 10.0000 mg | ORAL_TABLET | Freq: Every day | ORAL | 3 refills | Status: DC

## 2023-01-11 MED ORDER — PERFLUTREN LIPID MICROSPHERE
1.0000 mL | INTRAVENOUS | Status: AC | PRN
  Administered 2023-01-11: 1 mL via INTRAVENOUS

## 2023-01-11 NOTE — Patient Instructions (Signed)
START Jardiance 10 mg daily.  CHANGE Lasix to as needed only, for weight gain of 3lb in 24 hours, 5lb in a week or edema.  Your physician recommends that you schedule a follow-up appointment in: 3 months (May) ** please call the office Mid March to arrange your follow up appointment. **  If you have any questions or concerns before your next appointment please send [place] a message through [place] or call our office at [phone].    TO LEAVE A MESSAGE FOR THE NURSE SELECT OPTION 2, PLEASE LEAVE A MESSAGE INCLUDING: YOUR NAME DATE OF BIRTH CALL BACK NUMBER REASON FOR CALL**this is important as we prioritize the call backs  YOU WILL RECEIVE A CALL BACK THE SAME DAY AS LONG AS YOU CALL BEFORE 4:00 PM  At the [facility], you and your health needs are our priority. As part of our continuing mission to provide you with exceptional heart care, we have created designated Provider Care Teams. These Care Teams include your primary Cardiologist (physician) and Advanced Practice Providers (APPs- Physician Assistants and Nurse Practitioners) who all work together to provide you with the care you need, when you need it.   You may see any of the following providers on your designated Care Team at your next follow up: Dr Glori Bickers Dr Loralie Champagne Dr. Roxana Hires, NP Lyda Jester, [place] [facility] [place], [place] Forestine Na, NP Audry Riles, [name]   Please be sure to bring in all your medications bottles to every appointment.    Thank you for choosing [facility]

## 2023-01-11 NOTE — Telephone Encounter (Signed)
RETURN CALL: Upon patient request DME selection is [facility]. Patient understands he will be contacted by [facility] to set up his cpap. Patient understands to call if [facility] does not contact him with new setup in a timely manner. Patient understands they will be called once confirmation has been received from [facility]/ that they have received their new machine to schedule 10 week follow up appointment.   [facility] notified of new cpap order  Please add to airview Patient was grateful for the call and thanked me.

## 2023-01-11 NOTE — Progress Notes (Signed)
Advanced Heart Failure Clinic Note  Date:  01/02/2021   ID:  Julie Hernandez, DOB 01-11-1971, MRN [id number]  Location: Home  Provider location: [facility] Type of Visit: Established patient  PCP:  Truett Mainland, FNP  HF Cardiologist: Dr. Haroldine Laws  Chief Complaint: Heart Failure follow-up   HPI: Julie Hernandez is a 52 y.o. woman with COPD/ongoing tobacco abuse, premature CAD s/p anterior MI, diet-controlled DM2, chronic back pain, and systolic HF due to [facility] referred for further evaluation of CAD and systolic HF.  Had anterior MI in 2010 taken emergently to [facility]. Stent was placed emergently. Post stenting she continued to have CP. Went to [facility] that same year and had  a second stent placed. Did well from a cardiac perspective until 3/21.   Cath 02/08/20 at [facility] for NSTEMI. 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 Was told she may need repeat stenting vs CABG. Referred here for second opinion.   We saw her in 5/21 with daily angina. Underwent MRI which showed EF 26% only minimal viability in anterior wall. Decision to proceed with PCI of LCX and LAD (ISR) over CABG. Had successful PCI of LCX and LAD with Dr. Burt Knack on 5/20.   Echo 9/21 EF 60-65%.  Echo 04/06/21 EF 50-55% mild anterior and apical HK  Admitted 10/23 with NSTEMI. Echo showed EF back down to 20% and RV mildly reduced. AHF consulted and underwent R/LHC which showed elevated right and left filling pressures and low CO;  occluded LAD stent and new CTO of RCA with L>>R collaterals. Underwent staged PCI with DES to mid LAD. Diuresed with IV lasix and eventual transition to GDMT, no SGLT2i with poorly controlled DM. Plan for ASA + Plavix at least 12 month, ideally long-term though. Discharged home, weight 190 lbs.  Follow up 1/24, improved NYHA II  and volume stable. Remained off SGLT2i with uncontrolled DM.  Seen in ED 12/25/22 with abrupt onset CP and palpitations. Found to be in  SVT, no syncope or LOC. Resolved with vagal maneuvers and did not require adenosine. HR 100-110 in ED. BP soft and Coreg and Entresto held x 1 day. Hstrop 33 -> 38  Seen in HF Clinic on 12/27/22. Carvedilol switched to Toprol. Had another episode of SVT that evening. EMS called. She converted with vagal maneuvers.   Here for f/u. Has not had another episode of SVT since. Going to CR 3x/week. Feeling good. Denies CP or SOB. Not allowed to exercise last week due to low BP. Entresto stopped. BP now improved. No edema, orthopnea or PND. Wearing Zio now   Echo today 01/11/23 EF 30%  Sleep study 01/08/23 Moderate OSA 20.6 CSA 1.6  Cardiac Studies - R/LHC (10/23): RA 23 mean, PA 48/35 mean 39, PCWP mean 30, CO/CI (Fick) 3.7/2 Severe 2v CAD with 90% in stent re-stenonsis of mid LAD and CTO of mid RCA with L>>R collaterals  - Echo (10/23): EF 20%, RV mildly down.  - Echo (5/22): EF 50-55%, mid anterior and apical HK  - Echo (9/21): EF 60-65%  - Zio (7/21) 1. Sinus rhythm - avg HR of 82 2. 13 Ventricular Tachycardia runs occurred, the run with the fastest interval lasting 12 beats with a max rate of 139 bpm (avg 110 bpm); the run with the fastest interval was  also the longest 3. Very frequents PVCs (32.2%, [id number]), VE Couplets were rare (<1.0%, 2118),  4. Ventricular Bigeminy and Trigeminy were present. 5.. Multiple patient-triggered events associated  - cMRI (03/29/20) 1. Subendocardial late gadolinium enhancement consistent with prior infarct in the LAD territory. There is >50% transmural LGE suggesting nonviability in the basal to apical anterior wall and apex. There is <50% transmural LGE suggesting viability in the basal to mid anteroseptum and apical septum  2.  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 Normal LV size with severe systolic dysfunction  (EF 26%). Akinesis of basal to apical anterior/anteroseptal walls and apex   4.  Small RV size with normal systolic function (EF [id number] Past Medical History:  Diagnosis Date   CHF (congestive heart failure) (HCC)    Coronary artery disease    COVID 12/03/2020   Diabetes mellitus without complication (HCC)    Hypertension    Current Outpatient Medications  Medication Sig Dispense Refill   albuterol (VENTOLIN HFA) 108 (90 Base) MCG/ACT inhaler Inhale 2 puffs into the lungs every 4 (four) hours as needed for wheezing or shortness of breath.     aspirin EC 81 MG tablet Take 1 tablet (81 mg total) by mouth daily. 30 tablet 11   blood glucose meter kit and supplies Dispense based on patient and insurance preference. Use up to four times daily as directed. (FOR ICD-10 E10.9, E11.9). 1 each 1   clopidogrel (PLAVIX) 75 MG tablet Take 1 tablet (75 mg total) by mouth daily. 30 tablet 11   Continuous Blood Gluc Sensor (FREESTYLE LIBRE 3 SENSOR) MISC Change sensor every 14 days     escitalopram (LEXAPRO) 10 MG tablet Take 10 mg by mouth daily.     Evolocumab (REPATHA SURECLICK) [id number]/ML SOAJ Inject 140 mg into the skin every 14 (fourteen) days. 6 mL 3   ezetimibe (ZETIA) 10 MG tablet Take 1 tablet (10 mg total) by mouth daily at 6pm. 30 tablet 11   famotidine (PEPCID) 40 MG tablet Take 40 mg by mouth daily as needed for heartburn.     furosemide (LASIX) 20 MG tablet Take 3 tablets (60 mg total) by mouth daily. . 270 tablet 3   insulin glargine (LANTUS) 100 UNIT/ML injection Inject 24 Units into the skin daily.     Insulin Pen Needle 32G X 4 MM MISC Use with insulin pen 100 each 0   metFORMIN (GLUCOPHAGE) 500 MG tablet Take 2 tablets (1,000 mg total) by mouth 2 (two) times daily with a meal. 360 tablet 3   metoprolol succinate (TOPROL XL) 25 MG 24 hr tablet Take 1 tablet (25 mg total) by mouth daily. 30 tablet 8   nitroGLYCERIN (NITROSTAT) 0.4 MG SL tablet PLACE 1 TABLET UNDER THE TONGUE AND ALLOW TO  DISSOLVE SLOWLY, DO NOT CHEW OR SWALLOW. YOU MAY USE ADDITIONAL TABLETS EVERY 5 MINUTES BUT NO MORE THAN 3 TABLETS. 25 tablet 1   ondansetron (ZOFRAN ODT) 4 MG disintegrating tablet Take 1 tablet (4 mg total) by mouth every 8 (eight) hours as needed for nausea or vomiting. 10 tablet 0   potassium chloride SA (KLOR-CON M) 20 MEQ tablet Take 1 tablet (20 mEq total) by mouth daily. 90 tablet 3   RABEprazole (ACIPHEX) 20 MG tablet Take 40 mg by mouth daily.     Semaglutide,0.25 or 0.'5MG'$ /DOS, (OZEMPIC, 0.25 OR 0.5 MG/DOSE,) 2 MG/3ML SOPN Inject 0.25 mg into the skin once a week. 3 mL 0   spironolactone (  ALDACTONE) 25 MG tablet Take 1 tablet (25 mg total) by mouth daily. 30 tablet 5   sacubitril-valsartan (ENTRESTO) 24-26 MG Take 1 tablet by mouth 2 (two) times daily. (Patient not taking: Reported on 01/11/2023) 60 tablet 11   No current facility-administered medications for this encounter.   Facility-Administered Medications Ordered in Other Encounters  Medication Dose Route Frequency Provider Last Rate Last Admin   perflutren lipid microspheres (DEFINITY) IV suspension  1-10 mL Intravenous PRN Blakeley Scheier, Shaune Pascal, MD   1 mL at 01/11/23 1230   Allergies  Allergen Reactions   Esomeprazole Magnesium Anaphylaxis   Ciprofibrate Itching   Ciprofloxacin Itching   Gabapentin Other (See Comments)    Achy - myalgia   Lubiprostone Diarrhea   Statins     Myalgia - flu like symptoms   Amlodipine Rash   Iron Rash and Swelling   Latex Rash and Swelling   Povidone Iodine Rash   Social History   Socioeconomic History   Marital status: Divorced    Spouse name: Not on file   Number of children: 2   Years of education: Not on file   Highest education level: Not on file  Occupational History   Occupation: On disability  Tobacco Use   Smoking status: Former    Types: Cigarettes    Quit date: 02/18/2020    Years since quitting: 2.8   Smokeless tobacco: Never  Vaping Use   Vaping Use: Some days   Substance and Sexual Activity   Alcohol use: Yes    Comment: SOCIAL   Drug use: Never   Sexual activity: Not on file  Other Topics Concern   Not on file  Social History Narrative   Not on file   Social Determinants of Health   Financial Resource Strain: Not on file  Food Insecurity: Not on file  Transportation Needs: Not on file  Physical Activity: Not on file  Stress: Not on file  Social Connections: Not on file  Intimate Partner Violence: Not on file   FHx: M died at 68 from MI and HF D had stroke No brother and sisters  BP 90/60   Pulse 77   Wt 82.9 kg (182 lb 12.8 oz)   SpO2 98%   BMI 33.43 kg/m   Wt Readings from Last 3 Encounters:  01/11/23 82.9 kg (182 lb 12.8 oz)  12/28/22 83 kg (183 lb)  12/20/22 85.3 kg (188 lb)   PHYSICAL EXAM General:  NAD. No resp difficulty, walked into clinic HEENT: Normal Neck: Supple. No JVD, thick neck. Carotids 2+ bilat; no bruits. No lymphadenopathy or thryomegaly appreciated. Cor: PMI nondisplaced. Regular rate & rhythm. No rubs, gallops or murmurs. Lungs: Clear Abdomen: Soft, nontender, nondistended. No hepatosplenomegaly. No bruits or masses. Good bowel sounds. Extremities: No cyanosis, clubbing, rash, edema Neuro: Alert & oriented x 3, cranial nerves grossly intact. Moves all 4 extremities w/o difficulty. Affect pleasant.  ECG (personally reviewed): NSR, 88 bpm QRS 132 msec  ASSESSMENT & PLAN:  1. CAD - prior anterior MI w/ h/o PCI to mLAD and mLCX - Echo w/ drop in EF, from 55% >> 20%, RV mildly reduced - Cath (10/23): 90% in-stent restenosis of mLAD and CTO mRCA with left-to-right collaterals. LCx stent widely patent. -> S/P PCI DES mid LAD. - No ischemic CP - Continue Plavix 75 mg daily. - Plan aspirin + clopidogrel at least 12 months, but hopefully long term DAPT. - Continue Repatha - Continue CR.   2. Chronic Systolic Heart  Failure - Previous history of ICM following prior anterior MI.  - EF previously  25-30%, improved post LAD and LCx PCI - Echo (9/21): EF 60-65% - Echo (04/06/21): EF 50-55% mild anterior and apical HK - Echo (10/23): EF down 20%, RV mildly reduced in setting of LAD and RCA infarcts, previously placed mLAD stent w/ 99% stenosis, CTO mRCA w/ L>R collaterals  - RHC (10/23):  w/ elevated R+ L filling pressures and low output, RA 23, PCWP 30, CI 2.0  - Echo today 01/11/23 EF ~30%  - Improved NYHA II, Volume looks OK on exam, ReDs 37%.  - Stop Coreg. - Start Toprol XL 25 mg daily. - Off Entresto 24/26 mg bid. Due to low BP - Continue Lasix 40 mg daily. Discussed taking extra PRN for 3 days. - Continue spironolactone 25 mg daily.   - A1c improving, consider adding SGLT2i next visit.  - Labs today.  - Repeat echo next visit. Refer to EP if EF < 35%, QRs 132 msec on ECG today.  3. SVT - Zio (05/2020) showed mostly SR, frequent PVCs (32% burden), 13 runs of VT - Place Zio Live 2 week to quantify arrhythmias.  - No indication for LifeVest as she did not have syncope/LOC. Discussed with Dr. Aundra Dubin - Switch Coreg to Toprol as above. - Arrange sleep study. - Labs today. - Await echo in a couple weeks, consider referral to EP if EF < 35% and discuss EPS for SVT ablation.   4. Type 2DM - poorly controlled, in setting of poor compliance w/ regimen - Hgb A1c 13.1 (11/23)--> 8.5 (1/24) - Consider adding back SGLT2i next visit. - She has been referred to Endocrinology, has seen Dr. Cruzita Lederer in the past. Unfortunately 1st available is not until 07/2023. Will see if we can refer elsewhere.   5. HLD w/ LDL Goal < 55 - Statin intolerant. - Continue Repatha + Zetia - LDL 86 on admit  - May need trial of very low dose statin as adjunct.    6. H/o Tobacco Use - Quit cigarettes, currently vapes  - Discussed cessation. - Probably needs PFTs, defer to PCP.  7. Obesity - Body mass index is 33.43 kg/m. - Now on Ozempic.  8. Snoring - Arrange home sleep study.  Follow up in 3 weeks  with Dr. Haroldine Laws + echo, as scheduled.  Allena Katz, FNP-BC 01/11/23

## 2023-01-14 NOTE — Patient Instructions (Signed)

## 2023-01-15 ENCOUNTER — Ambulatory Visit (INDEPENDENT_AMBULATORY_CARE_PROVIDER_SITE_OTHER): Payer: PPO | Admitting: Nurse Practitioner

## 2023-01-15 ENCOUNTER — Encounter: Payer: Self-pay | Admitting: Nurse Practitioner

## 2023-01-15 VITALS — BP 108/71 | HR 87 | Ht 62.0 in | Wt 179.8 lb

## 2023-01-15 DIAGNOSIS — E1159 Type 2 diabetes mellitus with other circulatory complications: Secondary | ICD-10-CM | POA: Diagnosis not present

## 2023-01-15 DIAGNOSIS — Z794 Long term (current) use of insulin: Secondary | ICD-10-CM | POA: Diagnosis not present

## 2023-01-15 DIAGNOSIS — E1165 Type 2 diabetes mellitus with hyperglycemia: Secondary | ICD-10-CM

## 2023-01-15 MED ORDER — METFORMIN HCL 500 MG PO TABS: 1000.0000 mg | ORAL_TABLET | Freq: Two times a day (BID) | ORAL | 3 refills | Status: DC

## 2023-01-15 MED ORDER — OZEMPIC (0.25 OR 0.5 MG/DOSE) 2 MG/3ML ~~LOC~~ SOPN: 0.5000 mg | PEN_INJECTOR | SUBCUTANEOUS | 1 refills | Status: DC

## 2023-01-15 MED ORDER — FREESTYLE LIBRE 3 SENSOR MISC: 3 refills | Status: DC

## 2023-01-15 NOTE — Progress Notes (Signed)
Endocrinology Consult Note       01/16/2023, 9:06 AM   Subjective:    Patient ID: Julie Hernandez, female    DOB: Mar 22, 1971.  Julie Hernandez is being seen in consultation for management of currently uncontrolled symptomatic diabetes requested by  Truett Mainland, FNP.   Past Medical History:  Diagnosis Date   CHF (congestive heart failure) ([place])    Coronary artery disease    COVID 12/03/2020   Diabetes mellitus without complication ([place])    Hypertension     Past Surgical History:  Procedure Laterality Date   CORONARY BALLOON ANGIOPLASTY N/A 04/07/2020   Procedure: CORONARY BALLOON ANGIOPLASTY;  Surgeon: Sherren Mocha, MD;  Location: [place];  Service: Cardiovascular;  Laterality: N/A;   CORONARY STENT INTERVENTION N/A 04/07/2020   Procedure: CORONARY STENT INTERVENTION;  Surgeon: Sherren Mocha, MD;  Location: [facility];  Service: Cardiovascular;  Laterality: N/A;   CORONARY STENT INTERVENTION N/A 09/18/2022   Procedure: CORONARY STENT INTERVENTION;  Surgeon: Sherren Mocha, MD;  Location: [facility];  Service: Cardiovascular;  Laterality: N/A;   INTRAVASCULAR PRESSURE WIRE/FFR STUDY N/A 04/07/2020   Procedure: INTRAVASCULAR PRESSURE WIRE/FFR STUDY;  Surgeon: Sherren Mocha, MD;  Location: [facility];  Service: Cardiovascular;  Laterality: N/A;   INTRAVASCULAR ULTRASOUND/IVUS N/A 04/07/2020   Procedure: Intravascular Ultrasound/IVUS;  Surgeon: Sherren Mocha, MD;  Location: [facility];  Service: Cardiovascular;  Laterality: N/A;   LEFT HEART CATH AND CORONARY ANGIOGRAPHY N/A 04/07/2020   Procedure: LEFT HEART CATH AND CORONARY ANGIOGRAPHY;  Surgeon: Sherren Mocha, MD;  Location: [facility];  Service: Cardiovascular;  Laterality: N/A;   LEFT HEART CATH AND CORONARY ANGIOGRAPHY N/A 04/14/2021   Procedure: LEFT HEART CATH AND CORONARY ANGIOGRAPHY;  Surgeon:  Jolaine Artist, MD;  Location: [facility];  Service: Cardiovascular;  Laterality: N/A;   RIGHT/LEFT HEART CATH AND CORONARY ANGIOGRAPHY N/A 09/17/2022   Procedure: RIGHT/LEFT HEART CATH AND CORONARY ANGIOGRAPHY;  Surgeon: Nelva Bush, MD;  Location: [facility];  Service: Cardiovascular;  Laterality: N/A;    Social History   Socioeconomic History   Marital status: Divorced    Spouse name: Not on file   Number of children: 2   Years of education: Not on file   Highest education level: Not on file  Occupational History   Occupation: On disability  Tobacco Use   Smoking status: Former    Types: Cigarettes    Quit date: 02/18/2020    Years since quitting: 2.9   Smokeless tobacco: Never  Vaping Use   Vaping Use: Some days  Substance and Sexual Activity   Alcohol use: Yes    Comment: SOCIAL   Drug use: Never   Sexual activity: Not on file  Other Topics Concern   Not on file  Social History Narrative   Not on file   Social Determinants of Health   Financial Resource Strain: Not on file  Food Insecurity: Not on file  Transportation Needs: Not on file  Physical Activity: Not on file  Stress: Not on file  Social Connections: Not on file    Family History  Problem  Relation Age of Onset   Diabetes Mother    Hypertension Mother    Heart disease Mother    Hyperlipidemia Mother     Outpatient Encounter Medications as of 01/15/2023  Medication Sig   albuterol (VENTOLIN HFA) 108 (90 Base) MCG/ACT inhaler Inhale 2 puffs into the lungs every 4 (four) hours as needed for wheezing or shortness of breath.   aspirin EC 81 MG tablet Take 1 tablet (81 mg total) by mouth daily.   blood glucose meter kit and supplies Dispense based on patient and insurance preference. Use up to four times daily as directed. (FOR ICD-10 E10.9, E11.9).   clopidogrel (PLAVIX) 75 MG tablet Take 1 tablet (75 mg total) by mouth daily.   escitalopram (LEXAPRO) 10 MG tablet Take 10 mg by  mouth daily.   Evolocumab (REPATHA SURECLICK) [id number] MG/ML SOAJ Inject 140 mg into the skin every 14 (fourteen) days.   ezetimibe (ZETIA) 10 MG tablet Take 1 tablet (10 mg total) by mouth daily at 6pm.   famotidine (PEPCID) 40 MG tablet Take 40 mg by mouth daily as needed for heartburn.   furosemide (LASIX) 20 MG tablet Take 3 tablets (60 mg total) by mouth as needed. For weight gain of 3lb in 24hrs, 5lb in a week or edema   insulin glargine (LANTUS) 100 UNIT/ML injection Inject 5 Units into the skin at bedtime.   Insulin Pen Needle 32G X 4 MM MISC Use with insulin pen   metoprolol succinate (TOPROL XL) 25 MG 24 hr tablet Take 1 tablet (25 mg total) by mouth daily.   nitroGLYCERIN (NITROSTAT) 0.4 MG SL tablet PLACE 1 TABLET UNDER THE TONGUE AND ALLOW TO DISSOLVE SLOWLY, DO NOT CHEW OR SWALLOW. YOU MAY USE ADDITIONAL TABLETS EVERY 5 MINUTES BUT NO MORE THAN 3 TABLETS.   ondansetron (ZOFRAN ODT) 4 MG disintegrating tablet Take 1 tablet (4 mg total) by mouth every 8 (eight) hours as needed for nausea or vomiting.   potassium chloride SA (KLOR-CON M) 20 MEQ tablet Take 1 tablet (20 mEq total) by mouth daily.   RABEprazole (ACIPHEX) 20 MG tablet Take 40 mg by mouth daily.   spironolactone (ALDACTONE) 25 MG tablet Take 1 tablet (25 mg total) by mouth daily.   [DISCONTINUED] Continuous Blood Gluc Sensor (FREESTYLE LIBRE 3 SENSOR) MISC Change sensor every 14 days   [DISCONTINUED] empagliflozin (JARDIANCE) 10 MG TABS tablet Take 1 tablet (10 mg total) by mouth daily before breakfast.   [DISCONTINUED] metFORMIN (GLUCOPHAGE) 500 MG tablet Take 2 tablets (1,000 mg total) by mouth 2 (two) times daily with a meal.   [DISCONTINUED] Semaglutide,0.25 or 0.'5MG'$ /DOS, (OZEMPIC, 0.25 OR 0.5 MG/DOSE,) 2 MG/3ML SOPN Inject 0.25 mg into the skin once a week.   Continuous Blood Gluc Sensor (FREESTYLE LIBRE 3 SENSOR) MISC Change sensor every 14 days   metFORMIN (GLUCOPHAGE) 500 MG tablet Take 2 tablets (1,000 mg total) by mouth  2 (two) times daily with a meal.   sacubitril-valsartan (ENTRESTO) 24-26 MG Take 1 tablet by mouth 2 (two) times daily. (Patient not taking: Reported on 01/11/2023)   Semaglutide,0.25 or 0.'5MG'$ /DOS, (OZEMPIC, 0.25 OR 0.5 MG/DOSE,) 2 MG/3ML SOPN Inject 0.5 mg into the skin once a week.   No facility-administered encounter medications on file as of 01/15/2023.    ALLERGIES: Allergies  Allergen Reactions   Esomeprazole Magnesium Anaphylaxis   Ciprofibrate Itching   Ciprofloxacin Itching   Gabapentin Other (See Comments)    Achy - myalgia   Lubiprostone Diarrhea   Statins  Myalgia - flu like symptoms   Amlodipine Rash   Iron Rash and Swelling   Latex Rash and Swelling   Povidone Iodine Rash    VACCINATION STATUS:  There is no immunization history on file for this patient.  Diabetes She presents for her initial diabetic visit. She has type 2 diabetes mellitus. Onset time: diagnosed with GD at ate 27 then diabetes at age 61. Her disease course has been fluctuating. Hypoglycemia symptoms include nervousness/anxiousness, sweats and tremors. Associated symptoms include fatigue. Hypoglycemia complications include nocturnal hypoglycemia. 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 Risk factors for coronary artery disease include tobacco exposure, diabetes mellitus, dyslipidemia, family history, obesity and hypertension. Current diabetic treatment includes oral agent (dual therapy) and insulin injections (And Ozempic). She is compliant with treatment most of the time. Her weight is decreasing steadily. She is following a generally healthy diet. Meal planning includes avoidance of concentrated sweets. She has not had a previous visit with a dietitian. She participates in exercise three times a week. Her home blood glucose trend is decreasing steadily. (She presents today for her consultation with her CGM data on her phone showing at goal glycemic profile overall, if not  tight at times.  Her most recent A1c was 8.5% on 11/29/21.  Since then, she has really started working on her lifestyle changes to get back on track.  Analysis of her CGM shows TIR 93%, TAR 0%, TBR 7% with a GMI of 5.4%.  She drinks mostly water and diet pepsi, eats 2 meals per day and a snack between.  She goes to cardio rehab 3 times per week.  She is due for her annual eye exam, has seen podiatry in the past.) An ACE inhibitor/angiotensin II receptor blocker is not being taken. She sees a podiatrist.Eye exam is not current (due now).     Review of systems  Constitutional: + Minimally fluctuating body weight, current Body mass index is 32.89 kg/m., no fatigue, no subjective hyperthermia, no subjective hypothermia Eyes: no blurry vision, no xerophthalmia ENT: no sore throat, no nodules palpated in throat, no dysphagia/odynophagia, no hoarseness Cardiovascular: no chest pain, no shortness of breath, no palpitations, no leg swelling Respiratory: no cough, no shortness of breath Gastrointestinal: no nausea/vomiting/diarrhea Musculoskeletal: no muscle/joint aches Skin: no rashes, no hyperemia Neurological: no tremors, no numbness, no tingling, no dizziness Psychiatric: no depression, no anxiety  Objective:     BP 108/71 (BP Location: Right Arm, Patient Position: Sitting, Cuff Size: Normal)   Pulse 87   Ht '5\' 2"'$  (1.575 m)   Wt 179 lb 12.8 oz (81.6 kg)   BMI 32.89 kg/m   Wt Readings from Last 3 Encounters:  01/15/23 179 lb 12.8 oz (81.6 kg)  01/11/23 182 lb 12.8 oz (82.9 kg)  12/28/22 183 lb (83 kg)     BP Readings from Last 3 Encounters:  01/15/23 108/71  01/11/23 90/60  12/28/22 108/72     Physical Exam- Limited  Constitutional:  Body mass index is 32.89 kg/m. , not in acute distress, normal state of mind Eyes:  EOMI, no exophthalmos Neck: Supple Musculoskeletal: no gross deformities, strength intact in all four extremities, no gross restriction of joint movements Skin:  no  rashes, no hyperemia Neurological: no tremor with outstretched hands    CMP ( most recent) CMP     Component Value Date/Time   NA 140 12/28/2022 1154   K 4.4 12/28/2022 1154   CL 102 12/28/2022 1154   CO2  22 12/28/2022 1154   GLUCOSE 94 12/28/2022 1154   BUN 23 (H) 12/28/2022 1154   CREATININE 0.93 12/28/2022 1154   CALCIUM 9.7 12/28/2022 1154   PROT 7.0 12/28/2022 1154   PROT 6.2 06/21/2020 0922   ALBUMIN 4.2 12/28/2022 1154   ALBUMIN 4.2 06/21/2020 0922   AST 19 12/28/2022 1154   ALT 16 12/28/2022 1154   ALKPHOS 68 12/28/2022 1154   BILITOT 0.7 12/28/2022 1154   BILITOT <0.2 06/21/2020 0922   GFRNONAA >60 12/28/2022 1154   GFRAA >60 07/26/2020 1208     Diabetic Labs (most recent): Lab Results  Component Value Date   HGBA1C 8.5 (H) 11/29/2022   HGBA1C 13.1 (H) 09/18/2022   HGBA1C 13.3 (H) 10/19/2021     Lipid Panel ( most recent) Lipid Panel     Component Value Date/Time   CHOL 157 09/17/2022 0659   CHOL 113 06/21/2020 0922   TRIG 188 (H) 09/17/2022 0659   HDL 33 (L) 09/17/2022 0659   HDL 33 (L) 06/21/2020 0922   CHOLHDL 4.8 09/17/2022 0659   VLDL 38 09/17/2022 0659   LDLCALC 86 09/17/2022 0659   LDLCALC 54 06/21/2020 0922   LABVLDL 26 06/21/2020 0922      Lab Results  Component Value Date   TSH 1.125 12/28/2022           Assessment & Plan:   1) Type 2 diabetes mellitus with hyperglycemia, with long-term current use of insulin ([place])  She presents today for her consultation with her CGM data on her phone showing at goal glycemic profile overall, if not tight at times.  Her most recent A1c was 8.5% on 11/29/21.  Since then, she has really started working on her lifestyle changes to get back on track.  Analysis of her CGM shows TIR 93%, TAR 0%, TBR 7% with a GMI of 5.4%.  She drinks mostly water and diet pepsi, eats 2 meals per day and a snack between.  She goes to cardio rehab 3 times per week.  She is due for her annual eye exam, has seen podiatry  in the past.  - Julie Hernandez has currently uncontrolled symptomatic type 2 DM since 52 years of age, with most recent A1c of 8.5 %.   -Recent labs reviewed.  - I had a long discussion with her about the progressive nature of diabetes and the pathology behind its complications. 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 These are all discussed in detail with her.  The following Lifestyle Medicine recommendations according to [facility]) were discussed and offered to patient and she agrees to start the journey:  A. Whole Foods, Plant-based plate comprising of fruits and vegetables, plant-based proteins, whole-grain carbohydrates was discussed in detail with the patient.   A list for source of those nutrients were also provided to the patient.  Patient will use only water or unsweetened tea for hydration. B.  The need to stay away from risky substances including alcohol, smoking; obtaining 7 to 9 hours of restorative sleep, at least 150 minutes of moderate intensity exercise weekly, the importance of healthy social connections,  and stress reduction techniques were discussed. C.  A full color page of  Calorie density of various food groups per pound showing examples of each food groups was provided to the  patient.  - I have counseled her on diet and weight management by adopting a carbohydrate restricted/protein rich diet. Patient is encouraged to switch to unprocessed or minimally processed complex starch and increased protein intake (animal or plant source), fruits, and vegetables. -  she is advised to stick to a routine mealtimes to eat 3 meals a day and avoid unnecessary snacks (to snack only to correct hypoglycemia).   - she acknowledges that there is a room for improvement in her food and drink choices. -  Suggestion is made for her to avoid simple carbohydrates from her diet including Cakes, Sweet Desserts, Ice Cream, Soda (diet and regular), Sweet Tea, Candies, Chips, Cookies, Store Bought Juices, Alcohol in Excess of 1-2 drinks a day, Artificial Sweeteners, Coffee Creamer, and "Sugar-free" Products. This will help patient to have more stable blood glucose profile and potentially avoid unintended weight gain.  - I have approached her with the following individualized plan to manage her diabetes and patient agrees:   -She is advised to lower her Lantus to 5 units SQ nightly, continue Ozempic 0.5 mg SQ weekly (starting this dose this week), continue Metformin 1000 mg po twice daily after meals.  I did stop her Jardiance as I think she will be able to manage her diabetes without this medication.  I did discuss with her that her cardiologist may still recommend taking it for her CHF but as far as side effect profile, cost, and diabetes goes, I suspect she will do fine without it.  -she is encouraged to continue monitoring glucose 4 times daily (using her CGM), before meals and before bed, and to call the clinic if she has readings less than 70 or above 300 for 3 tests in a row.  - she is warned not to take insulin without proper monitoring per orders. - Adjustment parameters are given to her for hypo and hyperglycemia in writing.  She is excellent candidate for incretin therapy with GLP1 given her MI history.  - Specific targets for  A1c; LDL, HDL, and Triglycerides were discussed with the patient.  2) Blood Pressure /Hypertension:  her blood pressure is controlled to target.   she is advised to continue her current medications including Lasix 20 mg po daily, Metoprolol 25 mg mg p.o. daily with breakfast, and Spironolactone 25 mg po daily.  3) Lipids/Hyperlipidemia:    Review of her recent lipid panel from 09/17/22 showed controlled LDL at 86 and elevated triglycerides of 188 .  she is advised to  continue Zetia 10 mg daily at bedtime.  She has intolerance to statins.  4)  Weight/Diet:  her Body mass index is 32.89 kg/m.  -  clearly complicating her diabetes care.   she is a candidate for weight loss. I discussed with her the fact that loss of 5 - 10% of her  current body weight will have the most impact on her diabetes management.  Exercise, and detailed carbohydrates information provided  -  detailed on discharge instructions.  5) Chronic Care/Health Maintenance: -she is not on ACEI/ARB or Statin medications and is encouraged to initiate and continue to follow up with Ophthalmology, Dentist, Podiatrist at least yearly or according to recommendations, and advised to [facility] altogether . I have recommended yearly flu vaccine and pneumonia vaccine at least every 5 years; moderate intensity exercise for up to 150 minutes weekly; and sleep for at least 7 hours a day.  - she is advised to maintain close follow up with Truett Mainland,  FNP for primary care needs, as well as her other providers for optimal and coordinated care.   - Time spent in this patient care: 60 min, of which > 50% was spent in counseling her about her diabetes and the rest reviewing her blood glucose logs, discussing her hypoglycemia and hyperglycemia episodes, reviewing her current and previous labs/studies (including abstraction from other facilities) and medications doses and developing a long term treatment plan based on the latest standards of care/guidelines; and documenting her care.    Please refer to Patient Instructions for Blood Glucose Monitoring and Insulin/Medications Dosing Guide" in media tab for additional information. Please also refer to "Patient Self Inventory" in the Media tab for reviewed elements of pertinent patient history.  Julie Hernandez participated in the discussions, expressed understanding, and voiced agreement with the above plans.  All questions were answered to her satisfaction.  she is encouraged to contact clinic should she have any questions or concerns prior to her return visit.     Follow up plan: - Return in about 4 weeks (around 02/12/2023) for Diabetes F/U with A1c in office, No previsit labs, Bring meter and logs.    Julie Hernandez, [facility] [address] [place], [place] [address] Phone: [phone] Fax: [phone]  01/16/2023, 9:06 AM

## 2023-01-18 ENCOUNTER — Other Ambulatory Visit: Payer: Self-pay

## 2023-01-18 DIAGNOSIS — G4733 Obstructive sleep apnea (adult) (pediatric): Secondary | ICD-10-CM

## 2023-01-22 ENCOUNTER — Ambulatory Visit: Payer: PPO

## 2023-01-25 ENCOUNTER — Telehealth (HOSPITAL_BASED_OUTPATIENT_CLINIC_OR_DEPARTMENT_OTHER): Payer: Self-pay

## 2023-01-25 ENCOUNTER — Other Ambulatory Visit (HOSPITAL_BASED_OUTPATIENT_CLINIC_OR_DEPARTMENT_OTHER): Payer: Self-pay

## 2023-01-25 DIAGNOSIS — G4733 Obstructive sleep apnea (adult) (pediatric): Secondary | ICD-10-CM | POA: Insufficient documentation

## 2023-01-25 NOTE — Telephone Encounter (Signed)
Entering order for NP request on 12/28/2022.  No order noted. Georgana Curio MHA RN CCM

## 2023-01-25 NOTE — Addendum Note (Signed)
Encounter addended by: Chapman Moss, RN on: 01/25/2023 10:02 AM  Actions taken: Visit diagnoses modified

## 2023-01-30 NOTE — Addendum Note (Signed)
Encounter addended by: Micki Riley, RN on: 01/30/2023 3:33 PM  Actions taken: Imaging Exam ended

## 2023-02-04 ENCOUNTER — Telehealth: Payer: Self-pay | Admitting: *Deleted

## 2023-02-04 NOTE — Telephone Encounter (Signed)
Patient till does not have her Ozempic - she has been told by her pharmacy that a PA is needed.   Patient has been advised that the PA 's go to a PA team, that I would send this message to them, asking for a follow up on the status. Patient is about to run out of her current pen of Ozempic.

## 2023-02-05 ENCOUNTER — Other Ambulatory Visit (HOSPITAL_COMMUNITY): Payer: Self-pay

## 2023-02-05 ENCOUNTER — Telehealth: Payer: Self-pay | Admitting: Pharmacy Technician

## 2023-02-05 NOTE — Telephone Encounter (Unsigned)
Pharmacy Patient Advocate Encounter   Received notification from Pt calls msgs/LPN that prior authorization for Ozempic 0.25mg  or 0.5mg  is required/requested.   PA started on 02/05/23 to (ins) [employer] via CoverMyMeds **(so far, HTA isn't allowing CMM to submit request)*** Key or (Medicaid) confirmation # [id number]

## 2023-02-07 NOTE — Telephone Encounter (Signed)
Before mentioned PA request didn't go through [facility]. Filled out HTA form and faxed it over today.

## 2023-02-08 NOTE — Telephone Encounter (Signed)
Patient Advocate Encounter  Prior Authorization for Ozempic 0.25mg  or 0.5mg  (2mg /13ml) subcutaneous pen injector has been approved through [employer].     Effective: 02-07-2023 to 02-07-2024

## 2023-02-13 ENCOUNTER — Encounter: Payer: Self-pay | Admitting: Nurse Practitioner

## 2023-02-13 ENCOUNTER — Ambulatory Visit (INDEPENDENT_AMBULATORY_CARE_PROVIDER_SITE_OTHER): Payer: PPO | Admitting: Nurse Practitioner

## 2023-02-13 VITALS — BP 104/73 | HR 92 | Ht 62.0 in | Wt 170.4 lb

## 2023-02-13 DIAGNOSIS — Z794 Long term (current) use of insulin: Secondary | ICD-10-CM | POA: Diagnosis not present

## 2023-02-13 DIAGNOSIS — E1165 Type 2 diabetes mellitus with hyperglycemia: Secondary | ICD-10-CM | POA: Diagnosis not present

## 2023-02-13 NOTE — Progress Notes (Signed)
Endocrinology Follow Up Note       02/13/2023, 3:37 PM   Subjective:    Patient ID: Julie Hernandez, female    DOB: [birth year].  Julie Hernandez is being seen in follow up after being seen in consultation for management of currently uncontrolled symptomatic diabetes requested by  Truett Mainland, FNP.   Past Medical History:  Diagnosis Date   CHF (congestive heart failure) ([place])    Coronary artery disease    COVID 12/03/2020   Diabetes mellitus without complication ([place])    Hypertension     Past Surgical History:  Procedure Laterality Date   CORONARY BALLOON ANGIOPLASTY N/A 04/07/2020   Procedure: CORONARY BALLOON ANGIOPLASTY;  Surgeon: Sherren Mocha, MD;  Location: [facility];  Service: Cardiovascular;  Laterality: N/A;   CORONARY STENT INTERVENTION N/A 04/07/2020   Procedure: CORONARY STENT INTERVENTION;  Surgeon: Sherren Mocha, MD;  Location: [facility];  Service: Cardiovascular;  Laterality: N/A;   CORONARY STENT INTERVENTION N/A 09/18/2022   Procedure: CORONARY STENT INTERVENTION;  Surgeon: Sherren Mocha, MD;  Location: [facility];  Service: Cardiovascular;  Laterality: N/A;   INTRAVASCULAR PRESSURE WIRE/FFR STUDY N/A 04/07/2020   Procedure: INTRAVASCULAR PRESSURE WIRE/FFR STUDY;  Surgeon: Sherren Mocha, MD;  Location: [facility];  Service: Cardiovascular;  Laterality: N/A;   INTRAVASCULAR ULTRASOUND/IVUS N/A 04/07/2020   Procedure: Intravascular Ultrasound/IVUS;  Surgeon: Sherren Mocha, MD;  Location: [facility];  Service: Cardiovascular;  Laterality: N/A;   LEFT HEART CATH AND CORONARY ANGIOGRAPHY N/A 04/07/2020   Procedure: LEFT HEART CATH AND CORONARY ANGIOGRAPHY;  Surgeon: Sherren Mocha, MD;  Location: [facility];  Service: Cardiovascular;  Laterality: N/A;   LEFT HEART CATH AND CORONARY ANGIOGRAPHY N/A 04/14/2021   Procedure: LEFT HEART CATH AND  CORONARY ANGIOGRAPHY;  Surgeon: Jolaine Artist, MD;  Location: [facility];  Service: Cardiovascular;  Laterality: N/A;   RIGHT/LEFT HEART CATH AND CORONARY ANGIOGRAPHY N/A 09/17/2022   Procedure: RIGHT/LEFT HEART CATH AND CORONARY ANGIOGRAPHY;  Surgeon: Nelva Bush, MD;  Location: [facility];  Service: Cardiovascular;  Laterality: N/A;    Social History   Socioeconomic History   Marital status: Divorced    Spouse name: Not on file   Number of children: 2   Years of education: Not on file   Highest education level: Not on file  Occupational History   Occupation: On disability  Tobacco Use   Smoking status: Former    Types: Cigarettes    Quit date: 02/18/2020    Years since quitting: 2.9   Smokeless tobacco: Never  Vaping Use   Vaping Use: Some days  Substance and Sexual Activity   Alcohol use: Yes    Comment: SOCIAL   Drug use: Never   Sexual activity: Not on file  Other Topics Concern   Not on file  Social History Narrative   Not on file   Social Determinants of Health   Financial Resource Strain: Not on file  Food Insecurity: Not on file  Transportation Needs: Not on file  Physical Activity: Not on file  Stress: Not on file  Social Connections: Not on file  Family History  Problem Relation Age of Onset   Diabetes Mother    Hypertension Mother    Heart disease Mother    Hyperlipidemia Mother     Outpatient Encounter Medications as of 02/13/2023  Medication Sig   albuterol (VENTOLIN HFA) 108 (90 Base) MCG/ACT inhaler Inhale 2 puffs into the lungs every 4 (four) hours as needed for wheezing or shortness of breath.   aspirin EC 81 MG tablet Take 1 tablet (81 mg total) by mouth daily.   blood glucose meter kit and supplies Dispense based on patient and insurance preference. Use up to four times daily as directed. (FOR ICD-10 E10.9, E11.9).   clopidogrel (PLAVIX) 75 MG tablet Take 1 tablet (75 mg total) by mouth daily.   Continuous Blood  Gluc Sensor (FREESTYLE LIBRE 3 SENSOR) MISC Change sensor every 14 days   empagliflozin (JARDIANCE) 10 MG TABS tablet Take 10 mg by mouth daily.   escitalopram (LEXAPRO) 10 MG tablet Take 10 mg by mouth daily.   Evolocumab (REPATHA SURECLICK) [id number] MG/ML SOAJ Inject 140 mg into the skin every 14 (fourteen) days.   ezetimibe (ZETIA) 10 MG tablet Take 1 tablet (10 mg total) by mouth daily at 6pm.   famotidine (PEPCID) 40 MG tablet Take 40 mg by mouth daily as needed for heartburn.   furosemide (LASIX) 20 MG tablet Take 3 tablets (60 mg total) by mouth as needed. For weight gain of 3lb in 24hrs, 5lb in a week or edema   Insulin Pen Needle 32G X 4 MM MISC Use with insulin pen   metoprolol succinate (TOPROL XL) 25 MG 24 hr tablet Take 1 tablet (25 mg total) by mouth daily.   nitroGLYCERIN (NITROSTAT) 0.4 MG SL tablet PLACE 1 TABLET UNDER THE TONGUE AND ALLOW TO DISSOLVE SLOWLY, DO NOT CHEW OR SWALLOW. YOU MAY USE ADDITIONAL TABLETS EVERY 5 MINUTES BUT NO MORE THAN 3 TABLETS.   ondansetron (ZOFRAN ODT) 4 MG disintegrating tablet Take 1 tablet (4 mg total) by mouth every 8 (eight) hours as needed for nausea or vomiting.   potassium chloride SA (KLOR-CON M) 20 MEQ tablet Take 1 tablet (20 mEq total) by mouth daily.   [DISCONTINUED] insulin glargine (LANTUS) 100 UNIT/ML injection Inject 5 Units into the skin at bedtime.   metFORMIN (GLUCOPHAGE) 500 MG tablet Take 2 tablets (1,000 mg total) by mouth 2 (two) times daily with a meal.   RABEprazole (ACIPHEX) 20 MG tablet Take 40 mg by mouth daily.   sacubitril-valsartan (ENTRESTO) 24-26 MG Take 1 tablet by mouth 2 (two) times daily. (Patient not taking: Reported on 01/11/2023)   Semaglutide,0.25 or 0.5MG /DOS, (OZEMPIC, 0.25 OR 0.5 MG/DOSE,) 2 MG/3ML SOPN Inject 0.5 mg into the skin once a week.   spironolactone (ALDACTONE) 25 MG tablet Take 1 tablet (25 mg total) by mouth daily.   No facility-administered encounter medications on file as of 02/13/2023.     ALLERGIES: Allergies  Allergen Reactions   Esomeprazole Magnesium Anaphylaxis   Ciprofibrate Itching   Ciprofloxacin Itching   Gabapentin Other (See Comments)    Achy - myalgia   Lubiprostone Diarrhea   Statins     Myalgia - flu like symptoms   Amlodipine Rash   Iron Rash and Swelling   Latex Rash and Swelling   Povidone Iodine Rash    VACCINATION STATUS:  There is no immunization history on file for this patient.  Diabetes She presents for her follow-up diabetic visit. She has type 2 diabetes mellitus. Onset time: diagnosed  with GD at ate 27 then diabetes at age 56. Her disease course has been improving. There are no hypoglycemic associated symptoms. Associated symptoms include fatigue. There are no hypoglycemic complications. Symptoms are stable. 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 Risk factors for coronary artery disease include tobacco exposure, diabetes mellitus, dyslipidemia, family history, obesity and hypertension. Current diabetic treatment includes insulin injections and oral agent (monotherapy) (And Ozempic). She is compliant with treatment most of the time. Her weight is decreasing steadily. She is following a generally healthy diet. Meal planning includes avoidance of concentrated sweets. She has not had a previous visit with a dietitian. She participates in exercise three times a week. Her home blood glucose trend is decreasing steadily. (She presents today with her CGM showing at goal glycemic profile overall.  She is not due for another A1c today.  Analysis of her CGM shows TIR 98%, TAR 1%, TBR 1% with a GMI of 6%.  ) An ACE inhibitor/angiotensin II receptor blocker is not being taken. She sees a podiatrist.Eye exam is not current (due now).     Review of systems  Constitutional: + steadily decreasing body weight, current Body mass index is 31.17 kg/m., no fatigue, no subjective hyperthermia, no subjective hypothermia Eyes: no  blurry vision, no xerophthalmia ENT: no sore throat, no nodules palpated in throat, no dysphagia/odynophagia, no hoarseness Cardiovascular: no chest pain, no shortness of breath, no palpitations, no leg swelling Respiratory: no cough, no shortness of breath Gastrointestinal: no nausea/vomiting/diarrhea, + intermittent diarrhea (Hx IBS-C) Musculoskeletal: no muscle/joint aches Skin: no rashes, no hyperemia Neurological: no tremors, no numbness, no tingling, no dizziness Psychiatric: no depression, no anxiety  Objective:     BP 104/73 (BP Location: Right Arm, Patient Position: Sitting, Cuff Size: Normal)   Pulse 92   Ht 5\' 2"  (1.575 m)   Wt 170 lb 6.4 oz (77.3 kg)   BMI 31.17 kg/m   Wt Readings from Last 3 Encounters:  02/13/23 170 lb 6.4 oz (77.3 kg)  01/15/23 179 lb 12.8 oz (81.6 kg)  01/11/23 182 lb 12.8 oz (82.9 kg)     BP Readings from Last 3 Encounters:  02/13/23 104/73  01/15/23 108/71  01/11/23 90/60     Physical Exam- Limited  Constitutional:  Body mass index is 31.17 kg/m. , not in acute distress, normal state of mind Eyes:  EOMI, no exophthalmos Neck: Supple Musculoskeletal: no gross deformities, strength intact in all four extremities, no gross restriction of joint movements Skin:  no rashes, no hyperemia Neurological: no tremor with outstretched hands    CMP ( most recent) CMP     Component Value Date/Time   NA 140 12/28/2022 1154   K 4.4 12/28/2022 1154   CL 102 12/28/2022 1154   CO2 22 12/28/2022 1154   GLUCOSE 94 12/28/2022 1154   BUN 23 (H) 12/28/2022 1154   CREATININE 0.93 12/28/2022 1154   CALCIUM 9.7 12/28/2022 1154   PROT 7.0 12/28/2022 1154   PROT 6.2 06/21/2020 0922   ALBUMIN 4.2 12/28/2022 1154   ALBUMIN 4.2 06/21/2020 0922   AST 19 12/28/2022 1154   ALT 16 12/28/2022 1154   ALKPHOS 68 12/28/2022 1154   BILITOT 0.7 12/28/2022 1154   BILITOT <0.2 06/21/2020 0922   GFRNONAA >60 12/28/2022 1154   GFRAA >60 07/26/2020 1208      Diabetic Labs (most recent): Lab Results  Component Value Date   HGBA1C 8.5 (H) 11/29/2022   HGBA1C 13.1 (H) 09/18/2022  HGBA1C 13.3 (H) 10/19/2021     Lipid Panel ( most recent) Lipid Panel     Component Value Date/Time   CHOL 157 09/17/2022 0659   CHOL 113 06/21/2020 0922   TRIG 188 (H) 09/17/2022 0659   HDL 33 (L) 09/17/2022 0659   HDL 33 (L) 06/21/2020 0922   CHOLHDL 4.8 09/17/2022 0659   VLDL 38 09/17/2022 0659   LDLCALC 86 09/17/2022 0659   LDLCALC 54 06/21/2020 0922   LABVLDL 26 06/21/2020 0922      Lab Results  Component Value Date   TSH 1.125 12/28/2022           Assessment & Plan:   1) Type 2 diabetes mellitus with hyperglycemia, with long-term current use of insulin ([place])  She presents today with her CGM showing at goal glycemic profile overall.  She is not due for another A1c today.  Analysis of her CGM shows TIR 98%, TAR 1%, TBR 1% with a GMI of 6%.  - SAKARI HAVRILLA has currently uncontrolled symptomatic type 2 DM since 52 years of age.   -Recent labs reviewed.  - I had a long discussion with her about the progressive nature of diabetes and the pathology behind its complications. 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 These are all discussed in detail with her.  The following Lifestyle Medicine recommendations according to [facility]) were discussed and offered to patient and she agrees to start the journey:  A. Whole Foods, Plant-based plate comprising of fruits and vegetables, plant-based proteins, whole-grain carbohydrates was discussed in detail with the patient.   A list for source of those nutrients were also provided to the patient.  Patient will use only water or unsweetened tea for hydration. B.  The need to stay away from risky substances  including alcohol, smoking; obtaining 7 to 9 hours of restorative sleep, at least 150 minutes of moderate intensity exercise weekly, the importance of healthy social connections,  and stress reduction techniques were discussed. C.  A full color page of  Calorie density of various food groups per pound showing examples of each food groups was provided to the patient.  - Nutritional counseling repeated at each appointment due to patients tendency to fall back in to old habits.  - The patient admits there is a room for improvement in their diet and drink choices. -  Suggestion is made for the patient to avoid simple carbohydrates from their diet including Cakes, Sweet Desserts / Pastries, Ice Cream, Soda (diet and regular), Sweet Tea, Candies, Chips, Cookies, Sweet Pastries, Store Bought Juices, Alcohol in Excess of 1-2 drinks a day, Artificial Sweeteners, Coffee Creamer, and "Sugar-free" Products. This will help patient to have stable blood glucose profile and potentially avoid unintended weight gain.   - I encouraged the patient to switch to unprocessed or minimally processed complex starch and increased protein intake (animal or plant source), fruits, and vegetables.   - Patient is advised to stick to a routine mealtimes to eat 3 meals a day and avoid unnecessary snacks (to snack only to correct hypoglycemia).  - I have approached her with the following individualized plan to manage her diabetes and patient agrees:   -She is advised to stop her Lantus altogether today.  She did speak with her cardiologist who wants her to  stay on the Jardiance 10 mg po daily as she did not tolerate the Entresto previously.  She can also continue Metformin 1000 mg po twice daily and Ozempic 0.5 mg SQ weekly   -she is encouraged to continue monitoring glucose 4 times daily (using her CGM), before meals and before bed, and to call the clinic if she has readings less than 70 or above 300 for 3 tests in a row.  - she is  warned not to take insulin without proper monitoring per orders. - Adjustment parameters are given to her for hypo and hyperglycemia in writing.  She is excellent candidate for incretin therapy with GLP1 given her MI history.  - Specific targets for  A1c; LDL, HDL, and Triglycerides were discussed with the patient.  2) Blood Pressure /Hypertension:  her blood pressure is controlled to target.   she is advised to continue her current medications including Lasix 20 mg po daily, Metoprolol 25 mg mg p.o. daily with breakfast, and Spironolactone 25 mg po daily.  3) Lipids/Hyperlipidemia:    Review of her recent lipid panel from 09/17/22 showed controlled LDL at 86 and elevated triglycerides of 188 .  she is advised to continue Zetia 10 mg daily at bedtime.  She has intolerance to statins.  4)  Weight/Diet:  her Body mass index is 31.17 kg/m.  -  clearly complicating her diabetes care.   she is a candidate for weight loss. I discussed with her the fact that loss of 5 - 10% of her  current body weight will have the most impact on her diabetes management.  Exercise, and detailed carbohydrates information provided  -  detailed on discharge instructions.  5) Chronic Care/Health Maintenance: -she is not on ACEI/ARB or Statin medications and is encouraged to initiate and continue to follow up with Ophthalmology, Dentist, Podiatrist at least yearly or according to recommendations, and advised to [facility] altogether . I have recommended yearly flu vaccine and pneumonia vaccine at least every 5 years; moderate intensity exercise for up to 150 minutes weekly; and sleep for at least 7 hours a day.  - she is advised to maintain close follow up with Truett Mainland, FNP for primary care needs, as well as her other providers for optimal and coordinated care.    I spent  33  minutes in the care of the patient today including review of labs from [place], Lipids, Thyroid Function, Hematology (current and  previous including abstractions from other facilities); face-to-face time discussing  her blood glucose readings/logs, discussing hypoglycemia and hyperglycemia episodes and symptoms, medications doses, her options of short and long term treatment based on the latest standards of care / guidelines;  discussion about incorporating lifestyle medicine;  and documenting the encounter. Risk reduction counseling performed per USPSTF guidelines to reduce obesity and cardiovascular risk factors.     Please refer to Patient Instructions for Blood Glucose Monitoring and Insulin/Medications Dosing Guide"  in media tab for additional information. Please  also refer to " Patient Self Inventory" in the Media  tab for reviewed elements of pertinent patient history.  Westly Pam participated in the discussions, expressed understanding, and voiced agreement with the above plans.  All questions were answered to her satisfaction. she is encouraged to contact clinic should she have any questions or concerns prior to her return visit.     Follow up plan: - Return in about 3 months (around 05/16/2023) for Diabetes F/U with A1c in office, No previsit labs, Bring meter  and logs.   Rayetta Pigg, [facility] [address] [place], [place] [address] Phone: [phone] Fax: [phone]  02/13/2023, 3:37 PM

## 2023-02-13 NOTE — Patient Instructions (Signed)

## 2023-04-04 ENCOUNTER — Encounter (HOSPITAL_COMMUNITY): Payer: Self-pay | Admitting: Internal Medicine

## 2023-04-04 ENCOUNTER — Ambulatory Visit (HOSPITAL_COMMUNITY)
Admission: RE | Admit: 2023-04-04 | Discharge: 2023-04-04 | Disposition: A | Discharge status: Home / Self Care | Payer: PPO | Source: Ambulatory Visit | Attending: Internal Medicine | Admitting: Internal Medicine

## 2023-04-04 ENCOUNTER — Ambulatory Visit: Payer: PPO | Admitting: Dietician

## 2023-04-04 VITALS — BP 102/64 | HR 90 | Wt 163.4 lb

## 2023-04-04 DIAGNOSIS — I5189 Other ill-defined heart diseases: Secondary | ICD-10-CM

## 2023-04-04 DIAGNOSIS — Z8249 Family history of ischemic heart disease and other diseases of the circulatory system: Secondary | ICD-10-CM | POA: Diagnosis not present

## 2023-04-04 DIAGNOSIS — Z7984 Long term (current) use of oral hypoglycemic drugs: Secondary | ICD-10-CM | POA: Diagnosis not present

## 2023-04-04 DIAGNOSIS — Z794 Long term (current) use of insulin: Secondary | ICD-10-CM | POA: Diagnosis not present

## 2023-04-04 DIAGNOSIS — G8929 Other chronic pain: Secondary | ICD-10-CM | POA: Insufficient documentation

## 2023-04-04 DIAGNOSIS — I2510 Atherosclerotic heart disease of native coronary artery without angina pectoris: Secondary | ICD-10-CM | POA: Diagnosis not present

## 2023-04-04 DIAGNOSIS — I5022 Chronic systolic (congestive) heart failure: Secondary | ICD-10-CM | POA: Diagnosis present

## 2023-04-04 DIAGNOSIS — Z79899 Other long term (current) drug therapy: Secondary | ICD-10-CM | POA: Diagnosis not present

## 2023-04-04 DIAGNOSIS — E785 Hyperlipidemia, unspecified: Secondary | ICD-10-CM | POA: Diagnosis not present

## 2023-04-04 DIAGNOSIS — Z6829 Body mass index (BMI) 29.0-29.9, adult: Secondary | ICD-10-CM | POA: Insufficient documentation

## 2023-04-04 DIAGNOSIS — F17290 Nicotine dependence, other tobacco product, uncomplicated: Secondary | ICD-10-CM | POA: Insufficient documentation

## 2023-04-04 DIAGNOSIS — G4733 Obstructive sleep apnea (adult) (pediatric): Secondary | ICD-10-CM | POA: Diagnosis not present

## 2023-04-04 DIAGNOSIS — I252 Old myocardial infarction: Secondary | ICD-10-CM | POA: Insufficient documentation

## 2023-04-04 DIAGNOSIS — Z8616 Personal history of COVID-19: Secondary | ICD-10-CM | POA: Diagnosis not present

## 2023-04-04 DIAGNOSIS — I110 Hypertensive heart disease with heart failure: Secondary | ICD-10-CM | POA: Insufficient documentation

## 2023-04-04 DIAGNOSIS — Z955 Presence of coronary angioplasty implant and graft: Secondary | ICD-10-CM | POA: Diagnosis not present

## 2023-04-04 DIAGNOSIS — E669 Obesity, unspecified: Secondary | ICD-10-CM | POA: Insufficient documentation

## 2023-04-04 DIAGNOSIS — E118 Type 2 diabetes mellitus with unspecified complications: Secondary | ICD-10-CM

## 2023-04-04 DIAGNOSIS — I471 Supraventricular tachycardia, unspecified: Secondary | ICD-10-CM | POA: Diagnosis not present

## 2023-04-04 DIAGNOSIS — E119 Type 2 diabetes mellitus without complications: Secondary | ICD-10-CM | POA: Diagnosis not present

## 2023-04-04 DIAGNOSIS — Z87891 Personal history of nicotine dependence: Secondary | ICD-10-CM

## 2023-04-04 DIAGNOSIS — J449 Chronic obstructive pulmonary disease, unspecified: Secondary | ICD-10-CM | POA: Insufficient documentation

## 2023-04-04 LAB — CBC
HCT: 46.3 % — ABNORMAL HIGH (ref 36.0–46.0)
Hemoglobin: 14.1 g/dL (ref 12.0–15.0)
MCH: 25.2 pg — ABNORMAL LOW (ref 26.0–34.0)
MCHC: 30.5 g/dL (ref 30.0–36.0)
MCV: 82.7 fL (ref 80.0–100.0)
Platelets: 402 10*3/uL — ABNORMAL HIGH (ref 150–400)
RBC: 5.6 MIL/uL — ABNORMAL HIGH (ref 3.87–5.11)
RDW: 13.9 % (ref 11.5–15.5)
WBC: 8.8 10*3/uL (ref 4.0–10.5)
nRBC: 0 % (ref 0.0–0.2)

## 2023-04-04 LAB — BASIC METABOLIC PANEL
Anion gap: 11 (ref 5–15)
BUN: 17 mg/dL (ref 6–20)
CO2: 26 mmol/L (ref 22–32)
Calcium: 9.6 mg/dL (ref 8.9–10.3)
Chloride: 103 mmol/L (ref 98–111)
Creatinine, Ser: 0.92 mg/dL (ref 0.44–1.00)
GFR, Estimated: >60 mL/min (ref >60)
Glucose, Bld: 101 mg/dL — ABNORMAL HIGH (ref 70–99)
Potassium: 4.3 mmol/L (ref 3.5–5.1)
Sodium: 140 mmol/L (ref 135–145)

## 2023-04-04 LAB — MAGNESIUM: Magnesium: 1.9 mg/dL (ref 1.7–2.4)

## 2023-04-04 NOTE — Patient Instructions (Signed)
No changes to your medications.  Labs done today, your results will be available in MyChart, we will contact you for abnormal readings.  Your physician has requested that you have a cardiac MRI. Cardiac MRI uses a computer to create images of your heart as its beating, producing both still and moving pictures of your heart and major blood vessels. For further information please visit [email or web address]. Please follow the instruction sheet given to you today for more information. ONCE APPROVED BY YOUR INSURANCE COMPANY YOU WILL BE CALLED TO HAVE THE TEST ARRANGED.  Your physician recommends that you schedule a follow-up appointment in: 2-3 months  If you have any questions or concerns before your next appointment please send [place] a message through [place] or call our office at [phone].    TO LEAVE A MESSAGE FOR THE NURSE SELECT OPTION 2, PLEASE LEAVE A MESSAGE INCLUDING: YOUR NAME DATE OF BIRTH CALL BACK NUMBER REASON FOR CALL**this is important as we prioritize the call backs  YOU WILL RECEIVE A CALL BACK THE SAME DAY AS LONG AS YOU CALL BEFORE 4:00 PM  At the [facility], you and your health needs are our priority. As part of our continuing mission to provide you with exceptional heart care, we have created designated Provider Care Teams. These Care Teams include your primary Cardiologist (physician) and Advanced Practice Providers (APPs- Physician Assistants and Nurse Practitioners) who all work together to provide you with the care you need, when you need it.   You may see any of the following providers on your designated Care Team at your next follow up: Dr Arvilla Meres Dr Marca Ancona Dr. Marcos Eke, NP Robbie Lis, [name] [facility] [place], [name] Brynda Peon, NP Karle Plumber, [name]   Please be sure to bring in all your medications bottles to every appointment.    Thank you for choosing [facility]

## 2023-04-04 NOTE — Progress Notes (Addendum)
Advanced Heart Failure Clinic Note  Date:  01/02/2021   ID:  Julie Hernandez, DOB [birth year], MRN [id number]  Location: Home  Provider location: [facility] Type of Visit: Established patient  PCP:  Josph Macho, FNP  HF Cardiologist: Dr. Gala Romney  Chief Complaint: Heart Failure follow-up   HPI: Julie Hernandez is a 52 y.o. woman with COPD/ongoing tobacco abuse, premature CAD s/p anterior MI, diet-controlled DM2, chronic back pain, and systolic HF due to [facility] referred for further evaluation of CAD and systolic HF.  Had anterior MI in 2010 taken emergently to [facility]. Stent was placed emergently. Post stenting she continued to have CP. Went to [facility] that same year and had  a second stent placed. Did well from a cardiac perspective until 3/21.   Cath 02/08/20 at [facility] for NSTEMI. 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 Was told she may need repeat stenting vs CABG. Referred here for second opinion.   We saw her in 5/21 with daily angina. Underwent MRI which showed EF 26% only minimal viability in anterior wall. Decision to proceed with PCI of LCX and LAD (ISR) over CABG. Had successful PCI of LCX and LAD with Dr. Excell Seltzer on 5/20.   Echo 9/21 EF 60-65%.  Echo 04/06/21 EF 50-55% mild anterior and apical HK  Admitted 10/23 with NSTEMI. Echo showed EF back down to 20% and RV mildly reduced. AHF consulted and underwent R/LHC which showed elevated right and left filling pressures and low CO;  occluded LAD stent and new CTO of RCA with L>>R collaterals. Underwent staged PCI with DES to mid LAD. Diuresed with IV lasix and eventual transition to GDMT, no SGLT2i with poorly controlled DM. Plan for ASA + Plavix at least 12 month, ideally long-term though. Discharged home, weight 190 lbs.  Follow up 1/24, improved NYHA II  and volume stable. Remained off SGLT2i with uncontrolled DM.  Seen in ED 12/25/22 with abrupt onset CP and palpitations. Found to be in  SVT, no syncope or LOC. Resolved with vagal maneuvers and did not require adenosine. HR 100-110 in ED. BP soft and Coreg and Entresto held x 1 day. Hstrop 33 -> 38  Seen in HF Clinic on 12/27/22. Carvedilol switched to Toprol. Had another episode of SVT that evening. EMS called. She converted with vagal maneuvers.   Echo 01/11/23 EF 25-30% with WMA c/w previous LAD infarct Sleep  01/08/23 Moderate OSA 20.6 CSA 1.6  Here for routine f/u. Started CPAP last month. Overall feeling better up until the past 2 weeks when she got a sinus infection. Occasional episodes of fleeting CP. Nothing requiring NTG. Breathing 100% better. More energy. Still vaping regularly No edema, orthopnea or PND. Has lost 16 pounds. Finished CR  Zio 2/24 50 runs NSVT (longest 11 beats) 8.5% PVCs  Cardiac Studies - R/LHC (10/23): RA 23 mean, PA 48/35 mean 39, PCWP mean 30, CO/CI (Fick) 3.7/2 Severe 2v CAD with 90% in stent re-stenonsis of mid LAD and CTO of mid RCA with L>>R collaterals  - Echo (10/23): EF 20%, RV mildly down.  - Echo (5/22): EF 50-55%, mid anterior and apical HK  - Echo (9/21): EF 60-65%  - Zio (7/21) 1. Sinus rhythm - avg HR of 82 2. 13 Ventricular Tachycardia runs occurred, the run with the fastest interval  lasting 12 beats with a max rate of 139 bpm (avg 110 bpm); the run with the fastest interval was also the longest 3. Very frequents PVCs (32.2%, [id number]), VE Couplets were rare (<1.0%, 2118),  4. Ventricular Bigeminy and Trigeminy were present. 5.. Multiple patient-triggered events associated  - cMRI (03/29/20) 1. Subendocardial late gadolinium enhancement consistent with prior infarct in the LAD territory. There is >50% transmural LGE suggesting nonviability in the basal to apical anterior wall and apex. There is <50% transmural LGE suggesting viability in the  basal to mid anteroseptum and apical septum  2.  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 Normal LV size with severe systolic dysfunction (EF 26%). Akinesis of basal to apical anterior/anteroseptal walls and apex   4.  Small RV size with normal systolic function (EF 62%)  Past Medical History:  Diagnosis Date   CHF (congestive heart failure) (HCC)    Coronary artery disease    COVID 12/03/2020   Diabetes mellitus without complication (HCC)    Hypertension    Current Outpatient Medications  Medication Sig Dispense Refill   albuterol (VENTOLIN HFA) 108 (90 Base) MCG/ACT inhaler Inhale 2 puffs into the lungs every 4 (four) hours as needed for wheezing or shortness of breath.     amoxicillin-clavulanate (AUGMENTIN) 875-125 MG tablet Take 1 tablet by mouth 2 (two) times daily.     aspirin EC 81 MG tablet Take 1 tablet (81 mg total) by mouth daily. 30 tablet 11   blood glucose meter kit and supplies Dispense based on patient and insurance preference. Use up to four times daily as directed. (FOR ICD-10 E10.9, E11.9). 1 each 1   clopidogrel (PLAVIX) 75 MG tablet Take 1 tablet (75 mg total) by mouth daily. 30 tablet 11   Continuous Blood Gluc Sensor (FREESTYLE LIBRE 3 SENSOR) MISC Change sensor every 14 days 6 each 3   empagliflozin (JARDIANCE) 10 MG TABS tablet Take 10 mg by mouth daily.     escitalopram (LEXAPRO) 10 MG tablet Take 10 mg by mouth daily.     Evolocumab (REPATHA SURECLICK) 140 MG/ML SOAJ Inject 140 mg into the skin every 14 (fourteen) days. 6 mL 3   ezetimibe (ZETIA) 10 MG tablet Take 1 tablet (10 mg total) by mouth daily at 6pm. 30 tablet 11   famotidine (PEPCID) 40 MG tablet Take 40 mg by mouth daily as needed for heartburn.     furosemide (LASIX) 20 MG tablet Take 3 tablets (60 mg total) by mouth as needed. For weight gain of 3lb in 24hrs, 5lb in a week or edema 270 tablet 3   Insulin Pen Needle 32G X 4 MM MISC Use with insulin pen 100 each 0   metFORMIN (GLUCOPHAGE) 500  MG tablet Take 2 tablets (1,000 mg total) by mouth 2 (two) times daily with a meal. 360 tablet 3   metoprolol succinate (TOPROL XL) 25 MG 24 hr tablet Take 1 tablet (25 mg total) by mouth daily. 30 tablet 8   nitroGLYCERIN (NITROSTAT) 0.4 MG SL tablet PLACE 1 TABLET UNDER THE TONGUE AND ALLOW TO DISSOLVE SLOWLY, DO NOT CHEW OR SWALLOW. YOU MAY USE ADDITIONAL TABLETS EVERY 5 MINUTES BUT NO MORE THAN 3 TABLETS. 25 tablet 1   ondansetron (ZOFRAN ODT) 4 MG disintegrating tablet Take 1 tablet (4 mg total) by mouth every 8 (eight) hours as needed for nausea or vomiting. 10 tablet 0   potassium chloride SA (KLOR-CON M) 20 MEQ tablet Take 1 tablet (20  mEq total) by mouth daily. 90 tablet 3   RABEprazole (ACIPHEX) 20 MG tablet Take 40 mg by mouth daily.     Semaglutide,0.25 or 0.5MG /DOS, (OZEMPIC, 0.25 OR 0.5 MG/DOSE,) 2 MG/3ML SOPN Inject 0.5 mg into the skin once a week. 3 mL 1   spironolactone (ALDACTONE) 25 MG tablet Take 1 tablet (25 mg total) by mouth daily. 30 tablet 5   No current facility-administered medications for this encounter.   Allergies  Allergen Reactions   Esomeprazole Magnesium Anaphylaxis   Ciprofibrate Itching   Ciprofloxacin Itching   Gabapentin Other (See Comments)    Achy - myalgia   Lubiprostone Diarrhea   Statins     Myalgia - flu like symptoms   Amlodipine Rash   Iron Rash and Swelling   Latex Rash and Swelling   Povidone Iodine Rash   Social History   Socioeconomic History   Marital status: Divorced    Spouse name: Not on file   Number of children: 2   Years of education: Not on file   Highest education level: Not on file  Occupational History   Occupation: On disability  Tobacco Use   Smoking status: Former    Types: Cigarettes    Quit date: 02/18/2020    Years since quitting: 3.1   Smokeless tobacco: Never  Vaping Use   Vaping Use: Some days  Substance and Sexual Activity   Alcohol use: Yes    Comment: SOCIAL   Drug use: Never   Sexual activity: Not  on file  Other Topics Concern   Not on file  Social History Narrative   Not on file   Social Determinants of Health   Financial Resource Strain: Not on file  Food Insecurity: Not on file  Transportation Needs: Not on file  Physical Activity: Not on file  Stress: Not on file  Social Connections: Not on file  Intimate Partner Violence: Not on file   FHx: M died at 2 from MI and HF D had stroke No brother and sisters  BP 102/64   Pulse 90   Wt 74.1 kg (163 lb 6.4 oz)   SpO2 100%   BMI 29.89 kg/m   Wt Readings from Last 3 Encounters:  04/04/23 74.1 kg (163 lb 6.4 oz)  02/13/23 77.3 kg (170 lb 6.4 oz)  01/15/23 81.6 kg (179 lb 12.8 oz)   PHYSICAL EXAM General:  Well appearing. No resp difficulty HEENT: normal Neck: supple. no JVD. Carotids 2+ bilat; no bruits. No lymphadenopathy or thryomegaly appreciated. Cor: PMI nondisplaced. Regular rate & rhythm. No rubs, gallops or murmurs. Lungs: clear Abdomen: soft, nontender, nondistended. No hepatosplenomegaly. No bruits or masses. Good bowel sounds. Extremities: no cyanosis, clubbing, rash, edema Neuro: alert & orientedx3, cranial nerves grossly intact. moves all 4 extremities w/o difficulty. Affect pleasant  ECG: NSR 90 anterior Qs frequent monomorphic PVCs Personally reviewed  ASSESSMENT & PLAN:  1. CAD - prior anterior MI w/ h/o PCI to mLAD and mLCX - Echo w/ drop in EF, from 55% >> 20%, RV mildly reduced - Cath (10/23): 90% in-stent restenosis of mLAD and CTO mRCA with left-to-right collaterals. LCx stent widely patent. -> S/P PCI DES mid LAD. - Anginal symptoms under good control  - Continue DAPT. Lipids managed by [facility] - Has finished CR. Encouraged her to stay active and enroll in formal exercise program.    2. Chronic Systolic Heart Failure - Previous history of ICM following prior anterior MI.  - EF previously 25-30%,  improved post LAD and LCx PCI - Echo (9/21): EF 60-65% - Echo (04/06/21): EF 50-55%  mild anterior and apical HK - Echo (10/23): EF down 20%, RV mildly reduced in setting of LAD and RCA infarcts, previously placed mLAD stent w/ 99% stenosis, CTO mRCA w/ L>R collaterals  - RHC (10/23):  w/ elevated R+ L filling pressures and low output, RA 23, PCWP 30, CI 2.0  - Echo 01/11/23 EF 25-30% with WMA c/w previous LAD infarct - Improved NYHA II - Volume status ok. On PRN lasix - Continue Toprol XL 25 mg daily. - Off Entresto 24/26 mg bid due to low BP. Will try to get on low-dose losartan as BP tolerates - Continue spironolactone 25 mg daily.   - Continue Jardiance - Will get cMRI to reassess EF and assess scar burden. If EF <= 35% -> ICD  3. SVT - Zio (05/2020) showed mostly SR, frequent PVCs (32% burden), 13 runs of VT - Zio 2/24 50 runs NSVT (longest 11 beats) 8.5% PVCs - Continue current therapy  4. Type 2DM - improving control - Hgb A1c 13.1 (11/23)--> 8.5 (1/24) - Followed by PCP/ENDO - Continue SGLT2i today   5. HL w/ LDL Goal < 55 - Statin intolerant. - Continue Repatha + Zetia - Followed by Lipid Clinic   6. H/o Tobacco Use - Quit cigarettes, currently vapes  - Discussed need for cessation  7. Obesity - Body mass index is 29.89 kg/m. - Now on Ozempic.Has lost 13 pounds  8. OSA - Sleep study 01/08/23 Moderate OSA 20.6 CSA 1.6 - Much improved with CPAP - Follows with Dr. Otho Perl, MD  2:26 PM

## 2023-04-22 ENCOUNTER — Telehealth: Payer: Self-pay | Admitting: Nurse Practitioner

## 2023-04-22 NOTE — Telephone Encounter (Signed)
Pt is having issues with some medicines and wants to see you sooner.  Can I overbook?

## 2023-04-23 NOTE — Telephone Encounter (Signed)
Left a message requesting pt return call to the office. 

## 2023-04-23 NOTE — Telephone Encounter (Signed)
Can someone call her to get more specifics on her medication issues?

## 2023-04-23 NOTE — Telephone Encounter (Signed)
What specific issues is she having?  This may be something I can advise on without having to see her in person since the schedule is so packed right now.

## 2023-04-24 ENCOUNTER — Ambulatory Visit: Payer: PPO | Attending: Cardiology | Admitting: Cardiology

## 2023-04-24 ENCOUNTER — Encounter: Payer: Self-pay | Admitting: Cardiology

## 2023-04-24 VITALS — BP 102/60 | HR 85 | Ht 62.0 in | Wt 165.2 lb

## 2023-04-24 DIAGNOSIS — I10 Essential (primary) hypertension: Secondary | ICD-10-CM | POA: Diagnosis not present

## 2023-04-24 DIAGNOSIS — G4733 Obstructive sleep apnea (adult) (pediatric): Secondary | ICD-10-CM | POA: Diagnosis not present

## 2023-04-24 NOTE — Patient Instructions (Signed)
Medication Instructions:  Your physician recommends that you continue on your current medications as directed. Please refer to the Current Medication list given to you today.  *If you need a refill on your cardiac medications before your next appointment, please call your pharmacy*   Lab Work: None.  If you have labs (blood work) drawn today and your tests are completely normal, you will receive your results only by: MyChart Message (if you have MyChart) OR A paper copy in the mail If you have any lab test that is abnormal or we need to change your treatment, we will call you to review the results.   Testing/Procedures: None.   Follow-Up: At [facility], you and your health needs are our priority.  As part of our continuing mission to provide you with exceptional heart care, we have created designated Provider Care Teams.  These Care Teams include your primary Cardiologist (physician) and Advanced Practice Providers (APPs -  Physician Assistants and Nurse Practitioners) who all work together to provide you with the care you need, when you need it.  We recommend signing up for the patient portal called "MyChart".  Sign up information is provided on this After Visit Summary.  MyChart is used to connect with patients for Virtual Visits (Telemedicine).  Patients are able to view lab/test results, encounter notes, upcoming appointments, etc.  Non-urgent messages can be sent to your provider as well.   To learn more about what you can do with MyChart, go to [email or web address].    Your next appointment:   8 week(s)  Provider:   Dr. Armanda Magic, MD   Other Instructions Dr. Mayford Knife has ordered a chin strap for you to use with your cpap mask. Someone from our sleep team or someone from your DME company will call you to coordinate delivery.

## 2023-04-24 NOTE — Progress Notes (Signed)
Sleep Medicine CONSULT Note    Date:  04/24/2023   ID:  Julie Hernandez, DOB 01-17-1971, MRN [id number]  PCP:  Josph Macho, FNP  Cardiologist: Arvilla Meres, MD   Chief Complaint  Patient presents with   New Patient (Initial Visit)    OSA    History of Present Illness:  Julie Hernandez is a 52 y.o. female who is being seen today for the evaluation of obstructive sleep apnea at the request of Arvilla Meres, MD.  This is a 52 year old female with a history of congestive heart failure, CAD, diabetes and hypertension.  He was seen by Prince Rome, NP in February and complained of snoring.  She was not really having any significant daytime sleepiness. She was having a lot of SVT and there was concern that she could have OSA driving the arrhythmias.  Due to his history of hypertension, obesity and CHF a home sleep study was ordered.  Home sleep study demonstrated moderate obstructive sleep apnea with an AHI of 20.6/h and he was placed on auto CPAP from 4 to 15 cm H2O.  She is now referred for sleep medicine consultation to establish sleep care and treatment of his obstructive sleep apnea.  It has taken her a while to get adjusted to her PAP device.  She has noticed that her palpitations have significant improved after starting the CPAP.   She tolerates the mask but has problems taking it off in her sleep.  She feels the pressure is adequate.  Since going on PAP she feels rested in the am and has no significant daytime sleepiness.  She denies any significant mouth or nasal dryness or nasal congestion.  She does not think that he snores.    Past Medical History:  Diagnosis Date   CHF (congestive heart failure) (HCC)    Coronary artery disease    COVID 12/03/2020   Diabetes mellitus without complication (HCC)    Hypertension    OSA on CPAP    moderate obstructive sleep apnea with an AHI of 20.6/h and he was placed on auto CPAP from 4 to 15 cm H2O    Past Surgical History:   Procedure Laterality Date   CORONARY BALLOON ANGIOPLASTY N/A 04/07/2020   Procedure: CORONARY BALLOON ANGIOPLASTY;  Surgeon: Tonny Bollman, MD;  Location: [facility] INVASIVE CV LAB;  Service: Cardiovascular;  Laterality: N/A;   CORONARY PRESSURE/FFR STUDY N/A 04/07/2020   Procedure: INTRAVASCULAR PRESSURE WIRE/FFR STUDY;  Surgeon: Tonny Bollman, MD;  Location: [facility] INVASIVE CV LAB;  Service: Cardiovascular;  Laterality: N/A;   CORONARY STENT INTERVENTION N/A 04/07/2020   Procedure: CORONARY STENT INTERVENTION;  Surgeon: Tonny Bollman, MD;  Location: [facility] INVASIVE CV LAB;  Service: Cardiovascular;  Laterality: N/A;   CORONARY STENT INTERVENTION N/A 09/18/2022   Procedure: CORONARY STENT INTERVENTION;  Surgeon: Tonny Bollman, MD;  Location: [facility] INVASIVE CV LAB;  Service: Cardiovascular;  Laterality: N/A;   CORONARY ULTRASOUND/IVUS N/A 04/07/2020   Procedure: Intravascular Ultrasound/IVUS;  Surgeon: Tonny Bollman, MD;  Location: [facility] INVASIVE CV LAB;  Service: Cardiovascular;  Laterality: N/A;   LEFT HEART CATH AND CORONARY ANGIOGRAPHY N/A 04/07/2020   Procedure: LEFT HEART CATH AND CORONARY ANGIOGRAPHY;  Surgeon: Tonny Bollman, MD;  Location: [facility] INVASIVE CV LAB;  Service: Cardiovascular;  Laterality: N/A;   LEFT HEART CATH AND CORONARY ANGIOGRAPHY N/A 04/14/2021   Procedure: LEFT HEART CATH AND CORONARY ANGIOGRAPHY;  Surgeon: Dolores Patty, MD;  Location: [facility] INVASIVE CV LAB;  Service: Cardiovascular;  Laterality:  N/A;   RIGHT/LEFT HEART CATH AND CORONARY ANGIOGRAPHY N/A 09/17/2022   Procedure: RIGHT/LEFT HEART CATH AND CORONARY ANGIOGRAPHY;  Surgeon: Yvonne Kendall, MD;  Location: [facility] INVASIVE CV LAB;  Service: Cardiovascular;  Laterality: N/A;    Current Medications: Current Meds  Medication Sig   albuterol (VENTOLIN HFA) 108 (90 Base) MCG/ACT inhaler Inhale 2 puffs into the lungs every 4 (four) hours as needed for wheezing or shortness of breath.   amoxicillin-clavulanate (AUGMENTIN) 875-125  MG tablet Take 1 tablet by mouth 2 (two) times daily.   aspirin EC 81 MG tablet Take 1 tablet (81 mg total) by mouth daily.   blood glucose meter kit and supplies Dispense based on patient and insurance preference. Use up to four times daily as directed. (FOR ICD-10 E10.9, E11.9).   clopidogrel (PLAVIX) 75 MG tablet Take 1 tablet (75 mg total) by mouth daily.   Continuous Blood Gluc Sensor (FREESTYLE LIBRE 3 SENSOR) MISC Change sensor every 14 days   empagliflozin (JARDIANCE) 10 MG TABS tablet Take 10 mg by mouth daily.   escitalopram (LEXAPRO) 10 MG tablet Take 10 mg by mouth daily.   Evolocumab (REPATHA SURECLICK) 140 MG/ML SOAJ Inject 140 mg into the skin every 14 (fourteen) days.   ezetimibe (ZETIA) 10 MG tablet Take 1 tablet (10 mg total) by mouth daily at 6pm.   famotidine (PEPCID) 40 MG tablet Take 40 mg by mouth daily as needed for heartburn.   furosemide (LASIX) 20 MG tablet Take 3 tablets (60 mg total) by mouth as needed. For weight gain of 3lb in 24hrs, 5lb in a week or edema   Insulin Pen Needle 32G X 4 MM MISC Use with insulin pen   metFORMIN (GLUCOPHAGE) 500 MG tablet Take 2 tablets (1,000 mg total) by mouth 2 (two) times daily with a meal.   metoprolol succinate (TOPROL XL) 25 MG 24 hr tablet Take 1 tablet (25 mg total) by mouth daily.   nitroGLYCERIN (NITROSTAT) 0.4 MG SL tablet PLACE 1 TABLET UNDER THE TONGUE AND ALLOW TO DISSOLVE SLOWLY, DO NOT CHEW OR SWALLOW. YOU MAY USE ADDITIONAL TABLETS EVERY 5 MINUTES BUT NO MORE THAN 3 TABLETS.   ondansetron (ZOFRAN ODT) 4 MG disintegrating tablet Take 1 tablet (4 mg total) by mouth every 8 (eight) hours as needed for nausea or vomiting.   potassium chloride SA (KLOR-CON M) 20 MEQ tablet Take 1 tablet (20 mEq total) by mouth daily.   RABEprazole (ACIPHEX) 20 MG tablet Take 40 mg by mouth daily.   Semaglutide,0.25 or 0.5MG /DOS, (OZEMPIC, 0.25 OR 0.5 MG/DOSE,) 2 MG/3ML SOPN Inject 0.5 mg into the skin once a week.   spironolactone  (ALDACTONE) 25 MG tablet Take 1 tablet (25 mg total) by mouth daily.    Allergies:   Esomeprazole magnesium, Ciprofibrate, Ciprofloxacin, Gabapentin, Lubiprostone, Statins, Amlodipine, Iron, Latex, and Povidone iodine   Social History   Socioeconomic History   Marital status: Divorced    Spouse name: Not on file   Number of children: 2   Years of education: Not on file   Highest education level: Not on file  Occupational History   Occupation: On disability  Tobacco Use   Smoking status: Former    Types: Cigarettes    Quit date: 02/18/2020    Years since quitting: 3.1   Smokeless tobacco: Never  Vaping Use   Vaping Use: Some days  Substance and Sexual Activity   Alcohol use: Yes    Comment: SOCIAL   Drug use: Never  Sexual activity: Not on file  Other Topics Concern   Not on file  Social History Narrative   Not on file   Social Determinants of Health   Financial Resource Strain: Not on file  Food Insecurity: Not on file  Transportation Needs: Not on file  Physical Activity: Not on file  Stress: Not on file  Social Connections: Not on file     Family History:  The patient's family history includes Diabetes in her mother; Heart disease in her mother; Hyperlipidemia in her mother; Hypertension in her mother.   ROS:   Please see the history of present illness.    ROS All other systems reviewed and are negative.      No data to display             PHYSICAL EXAM:   VS:  BP 102/60   Pulse 85   Ht 5\' 2"  (1.575 m)   Wt 165 lb 3.2 oz (74.9 kg)   SpO2 98%   BMI 30.22 kg/m    GEN: Well nourished, well developed, in no acute distress  HEENT: normal  Neck: no JVD, carotid bruits, or masses Cardiac: RRR; no murmurs, rubs, or gallops,no edema.  Intact distal pulses bilaterally.  Respiratory:  clear to auscultation bilaterally, normal work of breathing GI: soft, nontender, nondistended, + BS MS: no deformity or atrophy  Skin: warm and dry, no rash Neuro:  Alert  and Oriented x 3, Strength and sensation are intact Psych: euthymic mood, full affect  Wt Readings from Last 3 Encounters:  04/24/23 165 lb 3.2 oz (74.9 kg)  04/04/23 163 lb 6.4 oz (74.1 kg)  02/13/23 170 lb 6.4 oz (77.3 kg)      Studies/Labs Reviewed:   Home sleep study and PAP compliance download  Recent Labs: 12/28/2022: ALT 16; B Natriuretic Peptide 232.5; TSH 1.125 04/04/2023: BUN 17; Creatinine, Ser 0.92; Hemoglobin 14.1; Magnesium 1.9; Platelets 402; Potassium 4.3; Sodium 140     Additional studies/ records that were reviewed today include:  none    ASSESSMENT:    1. OSA on CPAP   2. Essential hypertension      PLAN:  In order of problems listed above:  OSA - The patient is tolerating PAP therapy well without any problems. The PAP download performed by his DME was personally reviewed and interpreted by me today and showed an AHI of 7.3/hr on auto CPAP from 4 to 15 cm H2O with 0% compliance in using more than 4 hours nightly.  The patient has been using and benefiting from PAP use and will continue to benefit from therapy.  -She has a significant mask leak but says that she feels like for the most part she has a good seal.  She has not changed out her cushion yet so I encouraged her to call her DME to get supplies and instructed her to change out her cushion every 4 weeks.  -I am going to add on a chin strap to try to prevent any mouth breathing that could increase her AHI -I have encouraged him to be more compliant with his device  2.  Hypertension -BP controlled on exam today -Continue prescription drug management with Toprol-XL 25 mg daily and spironolactone 25 mg daily with as needed refills   Medication Adjustments/Labs and Tests Ordered: Current medicines are reviewed at length with the patient today.  Concerns regarding medicines are outlined above.  Medication changes, Labs and Tests ordered today are listed in the Patient Instructions below.  Followup with  me in 8 weeks   Signed, Armanda Magic, MD  04/24/2023 8:30 AM    [facility] [address] [place], [place], [place]  [address] Phone: [phone]; Fax: [phone]

## 2023-04-24 NOTE — Telephone Encounter (Signed)
Left a message requesting pt return call to the office. 

## 2023-04-25 ENCOUNTER — Telehealth: Payer: Self-pay | Admitting: *Deleted

## 2023-04-25 DIAGNOSIS — I5022 Chronic systolic (congestive) heart failure: Secondary | ICD-10-CM

## 2023-04-25 DIAGNOSIS — G4733 Obstructive sleep apnea (adult) (pediatric): Secondary | ICD-10-CM

## 2023-04-25 DIAGNOSIS — I2510 Atherosclerotic heart disease of native coronary artery without angina pectoris: Secondary | ICD-10-CM

## 2023-04-25 DIAGNOSIS — R0683 Snoring: Secondary | ICD-10-CM

## 2023-04-25 DIAGNOSIS — I10 Essential (primary) hypertension: Secondary | ICD-10-CM

## 2023-04-25 NOTE — Telephone Encounter (Signed)
-----   Message from Luellen Pucker, RN sent at 04/24/2023  8:41 AM EDT ----- Regarding: chin strap Good morning, Dr. Mayford Knife would like to order a chin strap for this patient. Julie Hernandez

## 2023-04-25 NOTE — Telephone Encounter (Signed)
Chin strap ordered with [employer] via community message.

## 2023-05-02 NOTE — Telephone Encounter (Signed)
Called [facility] they stated they would ship pt's ozempic to our office. States they are waiting for pt to send proof of her income. Left a message making pt aware.

## 2023-05-02 NOTE — Telephone Encounter (Signed)
Pt came by and said to call [facility] at [phone]. She said to call her and give her an update as well. She said you guys have been talking about this.

## 2023-05-17 ENCOUNTER — Other Ambulatory Visit (HOSPITAL_COMMUNITY): Payer: Self-pay

## 2023-05-17 DIAGNOSIS — I5022 Chronic systolic (congestive) heart failure: Secondary | ICD-10-CM

## 2023-05-17 MED ORDER — SPIRONOLACTONE 25 MG PO TABS: 25.0000 mg | ORAL_TABLET | Freq: Every day | ORAL | 11 refills | Status: DC

## 2023-05-26 ENCOUNTER — Other Ambulatory Visit: Payer: Self-pay | Admitting: Internal Medicine

## 2023-05-30 ENCOUNTER — Ambulatory Visit: Payer: PPO | Admitting: Nurse Practitioner

## 2023-05-30 ENCOUNTER — Encounter: Payer: Self-pay | Admitting: Nurse Practitioner

## 2023-05-30 VITALS — BP 115/71 | HR 85 | Ht 62.0 in | Wt 161.4 lb

## 2023-05-30 DIAGNOSIS — Z7984 Long term (current) use of oral hypoglycemic drugs: Secondary | ICD-10-CM

## 2023-05-30 DIAGNOSIS — Z7985 Long-term (current) use of injectable non-insulin antidiabetic drugs: Secondary | ICD-10-CM | POA: Diagnosis not present

## 2023-05-30 DIAGNOSIS — E1165 Type 2 diabetes mellitus with hyperglycemia: Secondary | ICD-10-CM

## 2023-05-30 DIAGNOSIS — E1159 Type 2 diabetes mellitus with other circulatory complications: Secondary | ICD-10-CM | POA: Diagnosis not present

## 2023-05-30 DIAGNOSIS — Z794 Long term (current) use of insulin: Secondary | ICD-10-CM | POA: Diagnosis not present

## 2023-05-30 LAB — POCT GLYCOSYLATED HEMOGLOBIN (HGB A1C): Hemoglobin A1C: 6.4 % — AB (ref 4.0–5.6)

## 2023-05-30 MED ORDER — METFORMIN HCL 500 MG PO TABS: 1000.0000 mg | ORAL_TABLET | Freq: Two times a day (BID) | ORAL | 3 refills | Status: DC

## 2023-05-30 NOTE — Progress Notes (Signed)
Endocrinology Follow Up Note       05/31/2023, 7:29 AM   Subjective:    Patient ID: Julie Hernandez, female    DOB: 18-Mar-1971.  Julie Hernandez is being seen in follow up after being seen in consultation for management of currently uncontrolled symptomatic diabetes requested by  Josph Macho, FNP.   Past Medical History:  Diagnosis Date   CHF (congestive heart failure) (HCC)    Coronary artery disease    COVID 12/03/2020   Diabetes mellitus without complication (HCC)    Hypertension    OSA on CPAP    moderate obstructive sleep apnea with an AHI of 20.6/h and he was placed on auto CPAP from 4 to 15 cm H2O    Past Surgical History:  Procedure Laterality Date   CORONARY BALLOON ANGIOPLASTY N/A 04/07/2020   Procedure: CORONARY BALLOON ANGIOPLASTY;  Surgeon: Tonny Bollman, MD;  Location: [facility] INVASIVE CV LAB;  Service: Cardiovascular;  Laterality: N/A;   CORONARY PRESSURE/FFR STUDY N/A 04/07/2020   Procedure: INTRAVASCULAR PRESSURE WIRE/FFR STUDY;  Surgeon: Tonny Bollman, MD;  Location: [facility] INVASIVE CV LAB;  Service: Cardiovascular;  Laterality: N/A;   CORONARY STENT INTERVENTION N/A 04/07/2020   Procedure: CORONARY STENT INTERVENTION;  Surgeon: Tonny Bollman, MD;  Location: [facility] INVASIVE CV LAB;  Service: Cardiovascular;  Laterality: N/A;   CORONARY STENT INTERVENTION N/A 09/18/2022   Procedure: CORONARY STENT INTERVENTION;  Surgeon: Tonny Bollman, MD;  Location: [facility] INVASIVE CV LAB;  Service: Cardiovascular;  Laterality: N/A;   CORONARY ULTRASOUND/IVUS N/A 04/07/2020   Procedure: Intravascular Ultrasound/IVUS;  Surgeon: Tonny Bollman, MD;  Location: [facility] INVASIVE CV LAB;  Service: Cardiovascular;  Laterality: N/A;   LEFT HEART CATH AND CORONARY ANGIOGRAPHY N/A 04/07/2020   Procedure: LEFT HEART CATH AND CORONARY ANGIOGRAPHY;  Surgeon: Tonny Bollman, MD;  Location: [facility] INVASIVE CV LAB;  Service: Cardiovascular;   Laterality: N/A;   LEFT HEART CATH AND CORONARY ANGIOGRAPHY N/A 04/14/2021   Procedure: LEFT HEART CATH AND CORONARY ANGIOGRAPHY;  Surgeon: Dolores Patty, MD;  Location: [facility] INVASIVE CV LAB;  Service: Cardiovascular;  Laterality: N/A;   RIGHT/LEFT HEART CATH AND CORONARY ANGIOGRAPHY N/A 09/17/2022   Procedure: RIGHT/LEFT HEART CATH AND CORONARY ANGIOGRAPHY;  Surgeon: Yvonne Kendall, MD;  Location: [facility] INVASIVE CV LAB;  Service: Cardiovascular;  Laterality: N/A;    Social History   Socioeconomic History   Marital status: Divorced    Spouse name: Not on file   Number of children: 2   Years of education: Not on file   Highest education level: Not on file  Occupational History   Occupation: On disability  Tobacco Use   Smoking status: Former    Current packs/day: 0.00    Types: Cigarettes    Quit date: 02/18/2020    Years since quitting: 3.2   Smokeless tobacco: Never  Vaping Use   Vaping status: Some Days  Substance and Sexual Activity   Alcohol use: Yes    Comment: SOCIAL   Drug use: Never   Sexual activity: Not on file  Other Topics Concern   Not on file  Social History Narrative   Not on file   Social Determinants of Health  Financial Resource Strain: Not on file  Food Insecurity: Not on file  Transportation Needs: Not on file  Physical Activity: Not on file  Stress: Not on file  Social Connections: Not on file    Family History  Problem Relation Age of Onset   Diabetes Mother    Hypertension Mother    Heart disease Mother    Hyperlipidemia Mother     Outpatient Encounter Medications as of 05/30/2023  Medication Sig   albuterol (VENTOLIN HFA) 108 (90 Base) MCG/ACT inhaler Inhale 2 puffs into the lungs every 4 (four) hours as needed for wheezing or shortness of breath.   aspirin EC 81 MG tablet Take 1 tablet (81 mg total) by mouth daily.   blood glucose meter kit and supplies Dispense based on patient and insurance preference. Use up to four times daily as  directed. (FOR ICD-10 E10.9, E11.9).   clopidogrel (PLAVIX) 75 MG tablet Take 1 tablet (75 mg total) by mouth daily.   Continuous Blood Gluc Sensor (FREESTYLE LIBRE 3 SENSOR) MISC Change sensor every 14 days   empagliflozin (JARDIANCE) 10 MG TABS tablet Take 10 mg by mouth daily.   escitalopram (LEXAPRO) 10 MG tablet Take 10 mg by mouth daily.   Evolocumab (REPATHA SURECLICK) 140 MG/ML SOAJ Inject 140 mg into the skin every 14 (fourteen) days.   ezetimibe (ZETIA) 10 MG tablet Take 1 tablet (10 mg total) by mouth daily at 6pm.   famotidine (PEPCID) 40 MG tablet Take 40 mg by mouth daily as needed for heartburn.   furosemide (LASIX) 20 MG tablet Take 3 tablets (60 mg total) by mouth as needed. For weight gain of 3lb in 24hrs, 5lb in a week or edema   Insulin Pen Needle 32G X 4 MM MISC Use with insulin pen   metoprolol succinate (TOPROL XL) 25 MG 24 hr tablet Take 1 tablet (25 mg total) by mouth daily.   nitroGLYCERIN (NITROSTAT) 0.4 MG SL tablet PLACE 1 TABLET UNDER THE TONGUE AND ALLOW TO DISSOLVE SLOWLY, DO NOT CHEW OR SWALLOW. YOU MAY USE ADDITIONAL TABLETS EVERY 5 MINUTES BUT NO MORE THAN 3 TABLETS.   ondansetron (ZOFRAN ODT) 4 MG disintegrating tablet Take 1 tablet (4 mg total) by mouth every 8 (eight) hours as needed for nausea or vomiting.   potassium chloride SA (KLOR-CON M) 20 MEQ tablet Take 1 tablet (20 mEq total) by mouth daily.   RABEprazole (ACIPHEX) 20 MG tablet Take 40 mg by mouth daily.   spironolactone (ALDACTONE) 25 MG tablet Take 1 tablet (25 mg total) by mouth daily.   [DISCONTINUED] metFORMIN (GLUCOPHAGE) 500 MG tablet Take 2 tablets (1,000 mg total) by mouth 2 (two) times daily with a meal.   [DISCONTINUED] Semaglutide,0.25 or 0.5MG /DOS, (OZEMPIC, 0.25 OR 0.5 MG/DOSE,) 2 MG/3ML SOPN Inject 0.5 mg into the skin once a week.   amoxicillin-clavulanate (AUGMENTIN) 875-125 MG tablet Take 1 tablet by mouth 2 (two) times daily. (Patient not taking: Reported on 05/30/2023)   metFORMIN  (GLUCOPHAGE) 500 MG tablet Take 2 tablets (1,000 mg total) by mouth 2 (two) times daily with a meal.   No facility-administered encounter medications on file as of 05/30/2023.    ALLERGIES: Allergies  Allergen Reactions   Esomeprazole Magnesium Anaphylaxis   Ciprofibrate Itching   Ciprofloxacin Itching   Gabapentin Other (See Comments)    Achy - myalgia   Lubiprostone Diarrhea   Statins     Myalgia - flu like symptoms   Amlodipine Rash   Iron Rash and  Swelling   Latex Rash and Swelling   Povidone Iodine Rash    VACCINATION STATUS:  There is no immunization history on file for this patient.  Diabetes She presents for her follow-up diabetic visit. She has type 2 diabetes mellitus. Onset time: diagnosed with GD at ate 27 then diabetes at age 2. Her disease course has been improving. There are no hypoglycemic associated symptoms. Associated symptoms include fatigue. There are no hypoglycemic complications. Symptoms are stable. 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 Risk factors for coronary artery disease include tobacco exposure, diabetes mellitus, dyslipidemia, family history, obesity and hypertension. Current diabetic treatment includes insulin injections and oral agent (monotherapy) (And Ozempic). She is compliant with treatment most of the time. Her weight is decreasing steadily. She is following a generally healthy diet. Meal planning includes avoidance of concentrated sweets. She has not had a previous visit with a dietitian. She participates in exercise three times a week. Her home blood glucose trend is decreasing steadily. Her overall blood glucose range is 110-130 mg/dl. (She presents today with her CGM showing at goal glycemic profile overall.  Her POCT A1c today is 6.4%, improving from last visit of 8.5%. Analysis of her CGM shows TIR 100%, TAR 0%, TBR 0% with a GMI of 6.1%.  She does have some constipation from the Ozempic but is known to have  IBS-C.) An ACE inhibitor/angiotensin II receptor blocker is not being taken. She sees a podiatrist.Eye exam is not current (due now).     Review of systems  Constitutional: + steadily decreasing body weight, current Body mass index is 29.52 kg/m., no fatigue, no subjective hyperthermia, no subjective hypothermia Eyes: no blurry vision, no xerophthalmia ENT: no sore throat, no nodules palpated in throat, no dysphagia/odynophagia, no hoarseness Cardiovascular: no chest pain, no shortness of breath, no palpitations, no leg swelling Respiratory: no cough, no shortness of breath Gastrointestinal: no nausea/vomiting/diarrhea, + intermittent constipation(Hx IBS-C) Musculoskeletal: no muscle/joint aches Skin: no rashes, no hyperemia Neurological: no tremors, no numbness, no tingling, no dizziness Psychiatric: no depression, no anxiety  Objective:     BP 115/71 (BP Location: Right Arm, Patient Position: Sitting, Cuff Size: Large)   Pulse 85   Ht 5\' 2"  (1.575 m)   Wt 161 lb 6.4 oz (73.2 kg)   BMI 29.52 kg/m   Wt Readings from Last 3 Encounters:  05/30/23 161 lb 6.4 oz (73.2 kg)  04/24/23 165 lb 3.2 oz (74.9 kg)  04/04/23 163 lb 6.4 oz (74.1 kg)     BP Readings from Last 3 Encounters:  05/30/23 115/71  04/24/23 102/60  04/04/23 102/64      Physical Exam- Limited  Constitutional:  Body mass index is 29.52 kg/m. , not in acute distress, normal state of mind Eyes:  EOMI, no exophthalmos Musculoskeletal: no gross deformities, strength intact in all four extremities, no gross restriction of joint movements Skin:  no rashes, no hyperemia Neurological: no tremor with outstretched hands   Diabetic Foot Exam - Simple   Simple Foot Form Diabetic Foot exam was performed with the following findings: Yes 05/31/2023  7:23 AM  Visual Inspection No deformities, no ulcerations, no other skin breakdown bilaterally: Yes Sensation Testing Intact to touch and monofilament testing bilaterally:  Yes Pulse Check Posterior Tibialis and Dorsalis pulse intact bilaterally: Yes Comments     CMP ( most recent) CMP     Component Value Date/Time   NA 140 04/04/2023 1440   K 4.3 04/04/2023 1440  CL 103 04/04/2023 1440   CO2 26 04/04/2023 1440   GLUCOSE 101 (H) 04/04/2023 1440   BUN 17 04/04/2023 1440   CREATININE 0.92 04/04/2023 1440   CALCIUM 9.6 04/04/2023 1440   PROT 7.0 12/28/2022 1154   PROT 6.2 06/21/2020 0922   ALBUMIN 4.2 12/28/2022 1154   ALBUMIN 4.2 06/21/2020 0922   AST 19 12/28/2022 1154   ALT 16 12/28/2022 1154   ALKPHOS 68 12/28/2022 1154   BILITOT 0.7 12/28/2022 1154   BILITOT <0.2 06/21/2020 0922   GFRNONAA >60 04/04/2023 1440   GFRAA >60 07/26/2020 1208     Diabetic Labs (most recent): Lab Results  Component Value Date   HGBA1C 6.4 (A) 05/30/2023   HGBA1C 8.5 (H) 11/29/2022   HGBA1C 13.1 (H) 09/18/2022     Lipid Panel ( most recent) Lipid Panel     Component Value Date/Time   CHOL 157 09/17/2022 0659   CHOL 113 06/21/2020 0922   TRIG 188 (H) 09/17/2022 0659   HDL 33 (L) 09/17/2022 0659   HDL 33 (L) 06/21/2020 0922   CHOLHDL 4.8 09/17/2022 0659   VLDL 38 09/17/2022 0659   LDLCALC 86 09/17/2022 0659   LDLCALC 54 06/21/2020 0922   LABVLDL 26 06/21/2020 0922      Lab Results  Component Value Date   TSH 1.125 12/28/2022           Assessment & Plan:   1) Type 2 diabetes mellitus with hyperglycemia, with long-term current use of insulin (HCC)  She presents today with her CGM showing at goal glycemic profile overall.  Her POCT A1c today is 6.4%, improving from last visit of 8.5%. Analysis of her CGM shows TIR 100%, TAR 0%, TBR 0% with a GMI of 6.1%.  She does have some constipation from the Ozempic but is known to have IBS-C.  - Julie Hernandez has currently uncontrolled symptomatic type 2 DM since 52 years of age.   -Recent labs reviewed.  - I had a long discussion with her about the progressive nature of diabetes and the pathology  behind its complications. 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 These are all discussed in detail with her.  The following Lifestyle Medicine recommendations according to American College of Lifestyle Medicine [facility]) were discussed and offered to patient and she agrees to start the journey:  A. Whole Foods, Plant-based plate comprising of fruits and vegetables, plant-based proteins, whole-grain carbohydrates was discussed in detail with the patient.   A list for source of those nutrients were also provided to the patient.  Patient will use only water or unsweetened tea for hydration. B.  The need to stay away from risky substances including alcohol, smoking; obtaining 7 to 9 hours of restorative sleep, at least 150 minutes of moderate intensity exercise weekly, the importance of healthy social connections,  and stress reduction techniques were discussed. C.  A full color page of  Calorie density of various food groups per pound showing examples of each food groups was provided to the patient.  - Nutritional counseling repeated at each appointment due to patients tendency to fall back in to old habits.  - The patient admits there is a room for improvement in their diet and drink choices. -  Suggestion is made for the patient to avoid simple carbohydrates from their diet including  Cakes, Sweet Desserts / Pastries, Ice Cream, Soda (diet and regular), Sweet Tea, Candies, Chips, Cookies, Sweet Pastries, Store Bought Juices, Alcohol in Excess of 1-2 drinks a day, Artificial Sweeteners, Coffee Creamer, and "Sugar-free" Products. This will help patient to have stable blood glucose profile and potentially avoid unintended weight gain.   - I encouraged the patient to switch to unprocessed or minimally processed complex starch and increased protein intake  (animal or plant source), fruits, and vegetables.   - Patient is advised to stick to a routine mealtimes to eat 3 meals a day and avoid unnecessary snacks (to snack only to correct hypoglycemia).  - I have approached her with the following individualized plan to manage her diabetes and patient agrees:   -She is advised to increase her Ozempic to 1 mg SQ weekly, continue Metformin 1000 mg po twice daily with meals, and Jardiance 10 mg po daily (per cardiology recommendations).    -she is encouraged to continue monitoring glucose 4 times daily (using her CGM), before meals and before bed, and to call the clinic if she has readings less than 70 or above 300 for 3 tests in a row.  - she is warned not to take insulin without proper monitoring per orders. - Adjustment parameters are given to her for hypo and hyperglycemia in writing.  She is excellent candidate for incretin therapy with GLP1 given her MI history.  - Specific targets for  A1c; LDL, HDL, and Triglycerides were discussed with the patient.  2) Blood Pressure /Hypertension:  her blood pressure is controlled to target.   she is advised to continue her current medications including Lasix 20 mg po daily, Metoprolol 25 mg mg p.o. daily with breakfast, and Spironolactone 25 mg po daily.  3) Lipids/Hyperlipidemia:    Review of her recent lipid panel from 09/17/22 showed controlled LDL at 86 and elevated triglycerides of 188 .  she is advised to continue Zetia 10 mg daily at bedtime.  She has intolerance to statins.  4)  Weight/Diet:  her Body mass index is 29.52 kg/m.  -  clearly complicating her diabetes care.   she is a candidate for weight loss. I discussed with her the fact that loss of 5 - 10% of her  current body weight will have the most impact on her diabetes management.  Exercise, and detailed carbohydrates information provided  -  detailed on discharge instructions.  5) Chronic Care/Health Maintenance: -she is not on ACEI/ARB or  Statin medications and is encouraged to initiate and continue to follow up with Ophthalmology, Dentist, Podiatrist at least yearly or according to recommendations, and advised to [facility] altogether . I have recommended yearly flu vaccine and pneumonia vaccine at least every 5 years; moderate intensity exercise for up to 150 minutes weekly; and sleep for at least 7 hours a day.  - she is advised to maintain close follow up with Josph Macho, FNP for primary care needs, as well as her other providers for optimal and coordinated care.     I spent  40  minutes in the care of the patient today including review of labs from CMP, Lipids, Thyroid Function, Hematology (current and previous including abstractions from other facilities); face-to-face time discussing  her blood glucose readings/logs, discussing hypoglycemia and hyperglycemia episodes and symptoms, medications doses, her options of short and long term treatment based on the latest standards of care / guidelines;  discussion about incorporating lifestyle medicine;  and documenting the encounter. Risk reduction counseling  performed per USPSTF guidelines to reduce obesity and cardiovascular risk factors.     Please refer to Patient Instructions for Blood Glucose Monitoring and Insulin/Medications Dosing Guide"  in media tab for additional information. Please  also refer to " Patient Self Inventory" in the Media  tab for reviewed elements of pertinent patient history.  Julie Hernandez participated in the discussions, expressed understanding, and voiced agreement with the above plans.  All questions were answered to her satisfaction. she is encouraged to contact clinic should she have any questions or concerns prior to her return visit.     Follow up plan: - Return in about 4 months (around 09/30/2023) for Diabetes F/U with A1c in office, No previsit labs, Bring meter and logs.   Ronny Bacon, [facility] [address] [place], [place] [address] Phone: [phone] Fax: [phone]  05/31/2023, 7:29 AM

## 2023-05-30 NOTE — Patient Instructions (Signed)

## 2023-06-05 ENCOUNTER — Encounter (HOSPITAL_COMMUNITY): Payer: Self-pay | Admitting: Internal Medicine

## 2023-06-05 ENCOUNTER — Ambulatory Visit (HOSPITAL_COMMUNITY)
Admission: RE | Admit: 2023-06-05 | Discharge: 2023-06-05 | Disposition: A | Discharge status: Home / Self Care | Payer: PPO | Source: Ambulatory Visit | Attending: Internal Medicine | Admitting: Internal Medicine

## 2023-06-05 ENCOUNTER — Other Ambulatory Visit: Payer: Self-pay

## 2023-06-05 ENCOUNTER — Other Ambulatory Visit: Payer: Self-pay | Admitting: Nurse Practitioner

## 2023-06-05 VITALS — BP 112/80 | HR 82 | Wt 162.6 lb

## 2023-06-05 DIAGNOSIS — I2510 Atherosclerotic heart disease of native coronary artery without angina pectoris: Secondary | ICD-10-CM | POA: Diagnosis not present

## 2023-06-05 DIAGNOSIS — Z87891 Personal history of nicotine dependence: Secondary | ICD-10-CM | POA: Insufficient documentation

## 2023-06-05 DIAGNOSIS — G8929 Other chronic pain: Secondary | ICD-10-CM | POA: Diagnosis not present

## 2023-06-05 DIAGNOSIS — I110 Hypertensive heart disease with heart failure: Secondary | ICD-10-CM | POA: Diagnosis not present

## 2023-06-05 DIAGNOSIS — I471 Supraventricular tachycardia, unspecified: Secondary | ICD-10-CM | POA: Diagnosis not present

## 2023-06-05 DIAGNOSIS — Z79899 Other long term (current) drug therapy: Secondary | ICD-10-CM | POA: Insufficient documentation

## 2023-06-05 DIAGNOSIS — Z955 Presence of coronary angioplasty implant and graft: Secondary | ICD-10-CM | POA: Diagnosis not present

## 2023-06-05 DIAGNOSIS — I5022 Chronic systolic (congestive) heart failure: Secondary | ICD-10-CM | POA: Diagnosis not present

## 2023-06-05 DIAGNOSIS — Z8616 Personal history of COVID-19: Secondary | ICD-10-CM | POA: Insufficient documentation

## 2023-06-05 DIAGNOSIS — Z7984 Long term (current) use of oral hypoglycemic drugs: Secondary | ICD-10-CM | POA: Insufficient documentation

## 2023-06-05 DIAGNOSIS — E119 Type 2 diabetes mellitus without complications: Secondary | ICD-10-CM | POA: Insufficient documentation

## 2023-06-05 DIAGNOSIS — I472 Ventricular tachycardia, unspecified: Secondary | ICD-10-CM | POA: Insufficient documentation

## 2023-06-05 DIAGNOSIS — J449 Chronic obstructive pulmonary disease, unspecified: Secondary | ICD-10-CM | POA: Diagnosis not present

## 2023-06-05 DIAGNOSIS — E118 Type 2 diabetes mellitus with unspecified complications: Secondary | ICD-10-CM

## 2023-06-05 DIAGNOSIS — G4733 Obstructive sleep apnea (adult) (pediatric): Secondary | ICD-10-CM | POA: Diagnosis not present

## 2023-06-05 DIAGNOSIS — R0989 Other specified symptoms and signs involving the circulatory and respiratory systems: Secondary | ICD-10-CM

## 2023-06-05 DIAGNOSIS — I493 Ventricular premature depolarization: Secondary | ICD-10-CM | POA: Diagnosis not present

## 2023-06-05 DIAGNOSIS — I252 Old myocardial infarction: Secondary | ICD-10-CM | POA: Insufficient documentation

## 2023-06-05 DIAGNOSIS — Z7902 Long term (current) use of antithrombotics/antiplatelets: Secondary | ICD-10-CM | POA: Diagnosis not present

## 2023-06-05 DIAGNOSIS — R008 Other abnormalities of heart beat: Secondary | ICD-10-CM | POA: Insufficient documentation

## 2023-06-05 DIAGNOSIS — E669 Obesity, unspecified: Secondary | ICD-10-CM | POA: Insufficient documentation

## 2023-06-05 DIAGNOSIS — Z7982 Long term (current) use of aspirin: Secondary | ICD-10-CM | POA: Diagnosis not present

## 2023-06-05 DIAGNOSIS — Z6829 Body mass index (BMI) 29.0-29.9, adult: Secondary | ICD-10-CM | POA: Insufficient documentation

## 2023-06-05 DIAGNOSIS — F419 Anxiety disorder, unspecified: Secondary | ICD-10-CM | POA: Diagnosis not present

## 2023-06-05 DIAGNOSIS — Z794 Long term (current) use of insulin: Secondary | ICD-10-CM | POA: Diagnosis not present

## 2023-06-05 DIAGNOSIS — I2089 Other forms of angina pectoris: Secondary | ICD-10-CM

## 2023-06-05 MED ORDER — SEMAGLUTIDE (1 MG/DOSE) 4 MG/3ML ~~LOC~~ SOPN: 1.0000 mg | PEN_INJECTOR | SUBCUTANEOUS | 1 refills | Status: DC

## 2023-06-05 NOTE — Patient Instructions (Addendum)
Good to see you today!  No medication changes  We have ordered a carotid ultrasound,we call you to schedule an appointment  You have been referred for cardiac rehab they will call you to schedule an appointment  Your physician recommends that you schedule a follow-up appointment in: 2-3 months(October) Call office mid August to schedule an appointment  If you have any questions or concerns before your next appointment please send [place] a message through [place] or call our office at [phone].    TO LEAVE A MESSAGE FOR THE NURSE SELECT OPTION 2, PLEASE LEAVE A MESSAGE INCLUDING: YOUR NAME DATE OF BIRTH CALL BACK NUMBER REASON FOR CALL**this is important as we prioritize the call backs  YOU WILL RECEIVE A CALL BACK THE SAME DAY AS LONG AS YOU CALL BEFORE 4:00 PM  At the [facility], you and your health needs are our priority. As part of our continuing mission to provide you with exceptional heart care, we have created designated Provider Care Teams. These Care Teams include your primary Cardiologist (physician) and Advanced Practice Providers (APPs- Physician Assistants and Nurse Practitioners) who all work together to provide you with the care you need, when you need it.   You may see any of the following providers on your designated Care Team at your next follow up: Dr Arvilla Meres Dr Marca Ancona Dr. Marcos Eke, NP Robbie Lis, [name] [facility] [place], [name] Brynda Peon, NP Karle Plumber, [name]   Please be sure to bring in all your medications bottles to every appointment.    Thank you for choosing [facility]

## 2023-06-05 NOTE — Progress Notes (Signed)
Advanced Heart Failure Clinic Note  Date:  01/02/2021   ID:  Julie Hernandez, DOB 1971-06-26, MRN [id number]  Location: Home  Provider location: [facility] Type of Visit: Established patient  PCP:  Josph Macho, FNP  HF Cardiologist: Dr. Gala Romney  Chief Complaint: Heart Failure follow-up   HPI: Julie Hernandez is a 52 y.o. woman with COPD/ongoing tobacco abuse (vaping), premature CAD s/p anterior MI, diet-controlled DM2, chronic back pain, and systolic HF due to [facility] referred for further evaluation of CAD and systolic HF.  Had anterior MI in 2010. LAD stent at [facility]. Went to [facility] that same year and had  a second stent placed. Did well from a cardiac perspective until 3/21.   Cath 02/08/20 at [facility] for NSTEMI. 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 Was told she may need repeat stenting vs CABG. Referred here for second opinion.   We saw her in 5/21 with daily angina. Underwent MRI which showed EF 26% only minimal viability in anterior wall. Decision to proceed with PCI of LCX and LAD (ISR) over CABG. Had successful PCI of LCX and LAD with Dr. Excell Seltzer on 5/20.   Echo 9/21 EF 60-65%.  Echo 04/06/21 EF 50-55% mild anterior and apical HK  Admitted 10/23 with NSTEMI. Echo EF back down to 20% and RV mildly reduced. Underwent R/LHC which showed elevated right and left filling pressures and low CO;  occluded LAD stent and new CTO of RCA with L>>R collaterals. Underwent staged PCI with DES to mid LAD.   Seen in ED 12/25/22 with  SVT,  Echo 01/11/23 EF 25-30% with WMA c/w previous LAD infarct Sleep  01/08/23 Moderate OSA 20.6 CSA 1.6  Zio 2/24 50 runs NSVT (longest 11 beats) 8.5% PVCs  Here for routine f/u. Over past 2-3 has been having several episodes of CP. Gets worse with anxiety. CPS come in the morning and go away  after lunch and don't come back. Still vaping regularly.   Scheduled for cMRI on 06/25/23.  Cardiac Studies - R/LHC (10/23): RA 23 mean, PA 48/35 mean 39, PCWP mean 30, CO/CI (Fick) 3.7/2 Severe 2v CAD with 90% in stent re-stenonsis of mid LAD and CTO of mid RCA with L>>R collaterals  - Echo (10/23): EF 20%, RV mildly down.  - Echo (5/22): EF 50-55%, mid anterior and apical HK  - Echo (9/21): EF 60-65%  - Zio (7/21) 1. Sinus rhythm - avg HR of 82 2. 13 Ventricular Tachycardia runs occurred, the run with the fastest interval lasting 12 beats with a max rate of 139 bpm (avg 110 bpm); the run with the fastest interval was also the longest 3. Very frequents PVCs (32.2%, [id number]), VE Couplets were rare (<1.0%, 2118),  4. Ventricular Bigeminy and Trigeminy were present. 5.. Multiple patient-triggered events associated  - cMRI (03/29/20) 1. Subendocardial late gadolinium enhancement consistent with prior infarct in the LAD territory. There is >50% transmural LGE suggesting nonviability in the basal to apical anterior wall and apex. There is <50% transmural LGE suggesting viability in the basal to mid anteroseptum and apical septum  2.  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 Normal LV size with severe systolic dysfunction (EF 26%). Akinesis of basal to apical anterior/anteroseptal walls and apex   4.  Small RV size with  normal systolic function (EF 62%)  Past Medical History:  Diagnosis Date   CHF (congestive heart failure) (HCC)    Coronary artery disease    COVID 12/03/2020   Diabetes mellitus without complication (HCC)    Hypertension    OSA on CPAP    moderate obstructive sleep apnea with an AHI of 20.6/h and he was placed on auto CPAP from 4 to 15 cm H2O   Current Outpatient Medications  Medication Sig Dispense Refill   albuterol (VENTOLIN HFA) 108 (90 Base) MCG/ACT inhaler Inhale 2 puffs into the lungs every 4 (four) hours as needed for wheezing or shortness of breath.      aspirin EC 81 MG tablet Take 1 tablet (81 mg total) by mouth daily. 30 tablet 11   blood glucose meter kit and supplies Dispense based on patient and insurance preference. Use up to four times daily as directed. (FOR ICD-10 E10.9, E11.9). 1 each 1   clopidogrel (PLAVIX) 75 MG tablet Take 1 tablet (75 mg total) by mouth daily. 30 tablet 11   Continuous Blood Gluc Sensor (FREESTYLE LIBRE 3 SENSOR) MISC Change sensor every 14 days 6 each 3   empagliflozin (JARDIANCE) 10 MG TABS tablet Take 10 mg by mouth daily.     escitalopram (LEXAPRO) 10 MG tablet Take 10 mg by mouth daily.     Evolocumab (REPATHA SURECLICK) 140 MG/ML SOAJ Inject 140 mg into the skin every 14 (fourteen) days. 6 mL 3   ezetimibe (ZETIA) 10 MG tablet Take 1 tablet (10 mg total) by mouth daily at 6pm. 30 tablet 11   famotidine (PEPCID) 40 MG tablet Take 40 mg by mouth daily as needed for heartburn.     furosemide (LASIX) 20 MG tablet Take 3 tablets (60 mg total) by mouth as needed. For weight gain of 3lb in 24hrs, 5lb in a week or edema 270 tablet 3   Insulin Pen Needle 32G X 4 MM MISC Use with insulin pen 100 each 0   metFORMIN (GLUCOPHAGE) 500 MG tablet Take 2 tablets (1,000 mg total) by mouth 2 (two) times daily with a meal. 360 tablet 3   metoprolol succinate (TOPROL XL) 25 MG 24 hr tablet Take 1 tablet (25 mg total) by mouth daily. 30 tablet 8   nitroGLYCERIN (NITROSTAT) 0.4 MG SL tablet PLACE 1 TABLET UNDER THE TONGUE AND ALLOW TO DISSOLVE SLOWLY, DO NOT CHEW OR SWALLOW. YOU MAY USE ADDITIONAL TABLETS EVERY 5 MINUTES BUT NO MORE THAN 3 TABLETS. 25 tablet 1   ondansetron (ZOFRAN ODT) 4 MG disintegrating tablet Take 1 tablet (4 mg total) by mouth every 8 (eight) hours as needed for nausea or vomiting. 10 tablet 0   potassium chloride SA (KLOR-CON M) 20 MEQ tablet Take 1 tablet (20 mEq total) by mouth daily. 90 tablet 3   RABEprazole (ACIPHEX) 20 MG tablet Take 40 mg by mouth daily.     spironolactone (ALDACTONE) 25 MG tablet  Take 1 tablet (25 mg total) by mouth daily. 30 tablet 11   Semaglutide, 1 MG/DOSE, 4 MG/3ML SOPN Inject 1 mg as directed once a week. (Patient not taking: Reported on 06/05/2023) 9 mL 1   No current facility-administered medications for this encounter.   Allergies  Allergen Reactions   Esomeprazole Magnesium Anaphylaxis   Ciprofibrate Itching   Ciprofloxacin Itching   Gabapentin Other (See Comments)    Achy - myalgia   Lubiprostone Diarrhea   Statins     Myalgia - flu like symptoms  Amlodipine Rash   Iron Rash and Swelling   Latex Rash and Swelling   Povidone Iodine Rash   Social History   Socioeconomic History   Marital status: Divorced    Spouse name: Not on file   Number of children: 2   Years of education: Not on file   Highest education level: Not on file  Occupational History   Occupation: On disability  Tobacco Use   Smoking status: Former    Current packs/day: 0.00    Types: Cigarettes    Quit date: 02/18/2020    Years since quitting: 3.2   Smokeless tobacco: Never  Vaping Use   Vaping status: Some Days  Substance and Sexual Activity   Alcohol use: Yes    Comment: SOCIAL   Drug use: Never   Sexual activity: Not on file  Other Topics Concern   Not on file  Social History Narrative   Not on file   Social Determinants of Health   Financial Resource Strain: Not on file  Food Insecurity: Not on file  Transportation Needs: Not on file  Physical Activity: Not on file  Stress: Not on file  Social Connections: Not on file  Intimate Partner Violence: Not on file   FHx: M died at 9 from MI and HF D had stroke No brother and sisters  BP 112/80   Pulse 82   Wt 73.8 kg (162 lb 9.6 oz)   SpO2 100%   BMI 29.74 kg/m   Wt Readings from Last 3 Encounters:  06/05/23 73.8 kg (162 lb 9.6 oz)  05/30/23 73.2 kg (161 lb 6.4 oz)  04/24/23 74.9 kg (165 lb 3.2 oz)   PHYSICAL EXAM General:  Well appearing. No resp difficulty HEENT: normal Neck: supple. no JVD.  Carotids 2+ bilat; ? R bruits. No lymphadenopathy or thryomegaly appreciated. Cor: PMI nondisplaced. Regular rate & rhythm. No rubs, gallops or murmurs. Lungs: clear but decreased Abdomen: soft, nontender, nondistended. No hepatosplenomegaly. No bruits or masses. Good bowel sounds. Extremities: no cyanosis, clubbing, rash, edema Neuro: alert & orientedx3, cranial nerves grossly intact. moves all 4 extremities w/o difficulty. Affect pleasant  ECG: NSR 82 anterior Qs Personally reviewed  ASSESSMENT & PLAN:  1. CAD - prior anterior MI w/ h/o PCI to mLAD and mLCX - Echo w/ drop in EF, from 55% >> 20%, RV mildly reduced - Cath (10/23): 90% in-stent restenosis of mLAD and CTO mRCA with left-to-right collaterals. LCx stent widely patent. -> S/P PCI DES mid LAD. - Having recurrent CP with typical and atypical features. Pattern is relatively stable. She would like to try CR. If symptoms progress will need repeat cath with Dr. Excell Seltzer - Continue DAPT. Lipids managed by [facility]   2. Chronic Systolic Heart Failure - Previous history of ICM following prior anterior MI.  - EF previously 25-30%, improved post LAD and LCx PCI - Echo (9/21): EF 60-65% - Echo (04/06/21): EF 50-55% mild anterior and apical HK - Echo (10/23): EF down 20%, RV mildly reduced in setting of LAD and RCA infarcts, previously placed mLAD stent w/ 99% stenosis, CTO mRCA w/ L>R collaterals  - RHC (10/23):  w/ elevated R+ L filling pressures and low output, RA 23, PCWP 30, CI 2.0  - Echo 01/11/23 EF 25-30% with WMA c/w previous LAD infarct - NYHA II-III - Volume status ok Using lasix prn  - Continue Toprol XL 25 mg daily. - Off Entresto 24/26 mg bid due to low BP. Will try to get  on low-dose losartan as BP tolerates - Continue spironolactone 25 mg daily.   - Continue Jardiance/Ozempic - Pending cMRI 06/25/23 to reassess EF and assess scar burden. If EF <= 35% -> ICD  3. SVT - Zio (05/2020) showed mostly SR, frequent PVCs (32%  burden), 13 runs of VT - Zio 2/24 50 runs NSVT (longest 11 beats) 8.5% PVCs - Continue current therapy  4. DM2 - improving control - Hgb A1c 13.1 (11/23)--> 8.5 (1/24) - Followed by PCP/ENDO - Continue SGLT2i and Ozempic   5. HL w/ LDL Goal < 55 - Statin intolerant. - Continue Repatha + Zetia - Followed by Lipid Clinic   6. H/o Tobacco Use - Quit cigarettes, currently vapes  - Discussed need for cessation  7. Obesity - Body mass index is 29.74 kg/m. - Now on Ozempic  8. OSA - Sleep study 01/08/23 Moderate OSA 20.6 CSA 1.6 - Continue CPAP  - Follows with Dr. Mayford Knife  9. Carotid bruit - check u/s   Arvilla Meres, MD  3:21 PM

## 2023-06-11 ENCOUNTER — Encounter: Payer: Self-pay | Admitting: *Deleted

## 2023-06-11 DIAGNOSIS — Z006 Encounter for examination for normal comparison and control in clinical research program: Secondary | ICD-10-CM

## 2023-06-11 NOTE — Research (Signed)
Visit 2 ANALOG Study  Patient was in control group of the ANALOG study. As of today patient has completed study. States she is doing well. Surveys for visit 2 completed for the study.      [place] Fatumata Kashani, Research Coordinator 06/11/2023

## 2023-06-17 ENCOUNTER — Ambulatory Visit (HOSPITAL_COMMUNITY)
Admission: RE | Admit: 2023-06-17 | Discharge: 2023-06-17 | Disposition: A | Discharge status: Home / Self Care | Payer: PPO | Source: Ambulatory Visit | Attending: Internal Medicine | Admitting: Internal Medicine

## 2023-06-17 DIAGNOSIS — R0989 Other specified symptoms and signs involving the circulatory and respiratory systems: Secondary | ICD-10-CM | POA: Insufficient documentation

## 2023-06-21 ENCOUNTER — Ambulatory Visit: Payer: PPO | Admitting: Cardiology

## 2023-06-24 ENCOUNTER — Telehealth (HOSPITAL_COMMUNITY): Payer: Self-pay | Admitting: *Deleted

## 2023-06-24 ENCOUNTER — Encounter (HOSPITAL_COMMUNITY): Payer: Self-pay

## 2023-06-24 MED ORDER — DIAZEPAM 5 MG PO TABS: 5.0000 mg | ORAL_TABLET | Freq: Once | ORAL | 0 refills | Status: AC

## 2023-06-24 NOTE — Telephone Encounter (Signed)
Reaching out to patient to offer assistance regarding upcoming cardiac imaging study; pt verbalizes understanding of appt date/time, parking situation and where to check in, pre-test NPO status and medications ordered, and verified current allergies; name and call back number provided for further questions should they arise Johney Frame RN Navigator Cardiac Imaging [facility] [phone] office [phone] cell   Patient to take Valium prior to MRI for claustophobia

## 2023-06-24 NOTE — Telephone Encounter (Signed)
Valium 5 mg x1 dose called into [facility] per Dr Gala Romney

## 2023-06-25 ENCOUNTER — Other Ambulatory Visit (HOSPITAL_COMMUNITY): Payer: Self-pay | Admitting: Internal Medicine

## 2023-06-25 ENCOUNTER — Ambulatory Visit (HOSPITAL_COMMUNITY)
Admission: RE | Admit: 2023-06-25 | Discharge: 2023-06-25 | Disposition: A | Discharge status: Home / Self Care | Payer: PPO | Source: Ambulatory Visit | Attending: Internal Medicine | Admitting: Internal Medicine

## 2023-06-25 DIAGNOSIS — I5189 Other ill-defined heart diseases: Secondary | ICD-10-CM | POA: Insufficient documentation

## 2023-06-25 MED ORDER — GADOBUTROL 1 MMOL/ML IV SOLN
7.0000 mL | Freq: Once | INTRAVENOUS | Status: AC | PRN
  Administered 2023-06-25: 7 mL via INTRAVENOUS

## 2023-07-03 ENCOUNTER — Telehealth (HOSPITAL_COMMUNITY): Payer: Self-pay

## 2023-07-03 DIAGNOSIS — I5022 Chronic systolic (congestive) heart failure: Secondary | ICD-10-CM

## 2023-07-03 NOTE — Telephone Encounter (Signed)
-----   Message from Arvilla Meres sent at 07/01/2023  2:00 PM EDT ----- No significant carotid disease

## 2023-07-03 NOTE — Telephone Encounter (Signed)
Spoke with patient regarding the following results. Patient made aware and patient verbalized understanding.   Referral placed.   Advised patient to call back to office with any issues, questions, or concerns. Patient verbalized understanding.

## 2023-07-03 NOTE — Telephone Encounter (Signed)
-----   Message from Arvilla Meres sent at 07/01/2023  1:58 PM EDT ----- EF 26% with large anterior infarct. Needs referral to EP for ICD.

## 2023-07-03 NOTE — Telephone Encounter (Signed)
Spoke with patient regarding the following results. Patient made aware and patient verbalized understanding.

## 2023-07-05 ENCOUNTER — Telehealth: Payer: Self-pay | Admitting: Nurse Practitioner

## 2023-07-05 NOTE — Telephone Encounter (Signed)
Called and let pt know that pt assistance was here.  Pt stated she would be by 07/18/23 or 07/19/23.

## 2023-07-16 NOTE — Telephone Encounter (Signed)
Patient will come this week .

## 2023-07-18 NOTE — Telephone Encounter (Signed)
Pt picked it up

## 2023-08-13 NOTE — Progress Notes (Deleted)
Sleep Medicine Note    Date:  08/13/2023   ID:  Julie Hernandez, DOB [birth year], MRN [id number]  PCP:  Josph Macho, FNP  Cardiologist: Arvilla Meres, MD   No chief complaint on file.   History of Present Illness:  Julie Hernandez is a 52 y.o. female with a history of congestive heart failure, CAD, diabetes and hypertension.  He was seen by Prince Rome, NP in February 2024 and complained of snoring.  She was not really having any significant daytime sleepiness. She was having a lot of SVT and there was concern that she could have OSA driving the arrhythmias.  Due to his history of hypertension, obesity and CHF a home sleep study was ordered.  Home sleep study demonstrated moderate obstructive sleep apnea with an AHI of 20.6/h and he was placed on auto CPAP from 4 to 15 cm H2O.  She is now referred for sleep medicine consultation to establish sleep care and treatment of his obstructive sleep apnea.  It has taken her a while to get adjusted to her PAP device.  She has noticed that her palpitations have significant improved after starting the CPAP.   At last OV she had a mask leak but had not changed out her cushion and she was mouth breathing some likely causing an elevated AHI at that OV.  A chin strap was added and encouraged her to try to change her cushion monthly to avoid high leaks.   She is doing well with her PAP device and thinks that she has gotten used to it.  She tolerates the mask and feels the pressure is adequate.  Since going on PAP she feels rested in the am and has no significant daytime sleepiness.  She denies any significant mouth or nasal dryness or nasal congestion.  She does not think that her snores.    Past Medical History:  Diagnosis Date   CHF (congestive heart failure) (HCC)    Coronary artery disease    COVID 12/03/2020   Diabetes mellitus without complication (HCC)    Hypertension    OSA on CPAP    moderate obstructive sleep apnea with an AHI of  20.6/h and he was placed on auto CPAP from 4 to 15 cm H2O    Past Surgical History:  Procedure Laterality Date   CORONARY BALLOON ANGIOPLASTY N/A 04/07/2020   Procedure: CORONARY BALLOON ANGIOPLASTY;  Surgeon: Tonny Bollman, MD;  Location: [facility] INVASIVE CV LAB;  Service: Cardiovascular;  Laterality: N/A;   CORONARY PRESSURE/FFR STUDY N/A 04/07/2020   Procedure: INTRAVASCULAR PRESSURE WIRE/FFR STUDY;  Surgeon: Tonny Bollman, MD;  Location: [facility] INVASIVE CV LAB;  Service: Cardiovascular;  Laterality: N/A;   CORONARY STENT INTERVENTION N/A 04/07/2020   Procedure: CORONARY STENT INTERVENTION;  Surgeon: Tonny Bollman, MD;  Location: [facility] INVASIVE CV LAB;  Service: Cardiovascular;  Laterality: N/A;   CORONARY STENT INTERVENTION N/A 09/18/2022   Procedure: CORONARY STENT INTERVENTION;  Surgeon: Tonny Bollman, MD;  Location: [facility] INVASIVE CV LAB;  Service: Cardiovascular;  Laterality: N/A;   CORONARY ULTRASOUND/IVUS N/A 04/07/2020   Procedure: Intravascular Ultrasound/IVUS;  Surgeon: Tonny Bollman, MD;  Location: [facility] INVASIVE CV LAB;  Service: Cardiovascular;  Laterality: N/A;   LEFT HEART CATH AND CORONARY ANGIOGRAPHY N/A 04/07/2020   Procedure: LEFT HEART CATH AND CORONARY ANGIOGRAPHY;  Surgeon: Tonny Bollman, MD;  Location: [facility] INVASIVE CV LAB;  Service: Cardiovascular;  Laterality: N/A;   LEFT HEART CATH AND CORONARY ANGIOGRAPHY N/A 04/14/2021   Procedure: LEFT  HEART CATH AND CORONARY ANGIOGRAPHY;  Surgeon: Dolores Patty, MD;  Location: [facility] INVASIVE CV LAB;  Service: Cardiovascular;  Laterality: N/A;   RIGHT/LEFT HEART CATH AND CORONARY ANGIOGRAPHY N/A 09/17/2022   Procedure: RIGHT/LEFT HEART CATH AND CORONARY ANGIOGRAPHY;  Surgeon: Yvonne Kendall, MD;  Location: [facility] INVASIVE CV LAB;  Service: Cardiovascular;  Laterality: N/A;    Current Medications: No outpatient medications have been marked as taking for the 08/14/23 encounter (Appointment) with Quintella Reichert, MD.    Allergies:    Esomeprazole magnesium, Ciprofibrate, Ciprofloxacin, Gabapentin, Lubiprostone, Statins, Amlodipine, Iron, Latex, and Povidone iodine   Social History   Socioeconomic History   Marital status: Divorced    Spouse name: Not on file   Number of children: 2   Years of education: Not on file   Highest education level: Not on file  Occupational History   Occupation: On disability  Tobacco Use   Smoking status: Former    Current packs/day: 0.00    Types: Cigarettes    Quit date: 02/18/2020    Years since quitting: 3.4   Smokeless tobacco: Never  Vaping Use   Vaping status: Some Days  Substance and Sexual Activity   Alcohol use: Yes    Comment: SOCIAL   Drug use: Never   Sexual activity: Not on file  Other Topics Concern   Not on file  Social History Narrative   Not on file   Social Determinants of Health   Financial Resource Strain: Not on file  Food Insecurity: Not on file  Transportation Needs: Not on file  Physical Activity: Not on file  Stress: Not on file  Social Connections: Not on file     Family History:  The patient's family history includes Diabetes in her mother; Heart disease in her mother; Hyperlipidemia in her mother; Hypertension in her mother.   ROS:   Please see the history of present illness.    ROS All other systems reviewed and are negative.      No data to display             PHYSICAL EXAM:   VS:  There were no vitals taken for this visit.   GEN: Well nourished, well developed in no acute distress HEENT: Normal NECK: No JVD; No carotid bruits LYMPHATICS: No lymphadenopathy CARDIAC:RRR, no murmurs, rubs, gallops RESPIRATORY:  Clear to auscultation without rales, wheezing or rhonchi  ABDOMEN: Soft, non-tender, non-distended MUSCULOSKELETAL:  No edema; No deformity  SKIN: Warm and dry NEUROLOGIC:  Alert and oriented x 3 PSYCHIATRIC:  Normal affect    Wt Readings from Last 3 Encounters:  06/05/23 162 lb 9.6 oz (73.8 kg)  05/30/23 161 lb  6.4 oz (73.2 kg)  04/24/23 165 lb 3.2 oz (74.9 kg)      Studies/Labs Reviewed:   Home sleep study and PAP compliance download  Recent Labs: 12/28/2022: ALT 16; B Natriuretic Peptide 232.5; TSH 1.125 04/04/2023: BUN 17; Creatinine, Ser 0.92; Hemoglobin 14.1; Magnesium 1.9; Platelets 402; Potassium 4.3; Sodium 140     Additional studies/ records that were reviewed today include:  none    ASSESSMENT:    1. OSA on CPAP   2. Essential hypertension       PLAN:  In order of problems listed above:  OSA - The patient is tolerating PAP therapy well without any problems. The PAP download performed by his DME was personally reviewed and interpreted by me today and showed an AHI of ***/hr on *** cm H2O with ***%  compliance in using more than 4 hours nightly.  The patient has been using and benefiting from PAP use and will continue to benefit from therapy.   Hypertension -BP controlled on exam today -continue prescription drug management with Toprol XL 25mg  daily and spiro 25mg  daily with PRN refills   Medication Adjustments/Labs and Tests Ordered: Current medicines are reviewed at length with the patient today.  Concerns regarding medicines are outlined above.  Medication changes, Labs and Tests ordered today are listed in the Patient Instructions below.  Followup with me in 8 weeks   Signed, Armanda Magic, MD  08/13/2023 9:01 PM    [facility] [address] [place], [place], [place]  [address] Phone: [phone]; Fax: [phone]

## 2023-08-14 ENCOUNTER — Ambulatory Visit: Payer: PPO | Attending: Cardiology | Admitting: Cardiology

## 2023-08-14 DIAGNOSIS — I10 Essential (primary) hypertension: Secondary | ICD-10-CM

## 2023-08-14 DIAGNOSIS — G4733 Obstructive sleep apnea (adult) (pediatric): Secondary | ICD-10-CM

## 2023-08-16 ENCOUNTER — Ambulatory Visit: Payer: PPO | Admitting: Cardiovascular Disease

## 2023-08-19 ENCOUNTER — Other Ambulatory Visit (HOSPITAL_COMMUNITY): Payer: Self-pay

## 2023-08-19 DIAGNOSIS — I5022 Chronic systolic (congestive) heart failure: Secondary | ICD-10-CM

## 2023-08-19 MED ORDER — METOPROLOL SUCCINATE ER 25 MG PO TB24
25.0000 mg | ORAL_TABLET | Freq: Every day | ORAL | 1 refills | Status: DC
Start: 2023-08-19 — End: 2023-10-29

## 2023-09-04 ENCOUNTER — Other Ambulatory Visit: Payer: Self-pay | Admitting: *Deleted

## 2023-09-04 DIAGNOSIS — E1159 Type 2 diabetes mellitus with other circulatory complications: Secondary | ICD-10-CM

## 2023-09-04 DIAGNOSIS — Z794 Long term (current) use of insulin: Secondary | ICD-10-CM

## 2023-09-04 DIAGNOSIS — Z7985 Long-term (current) use of injectable non-insulin antidiabetic drugs: Secondary | ICD-10-CM

## 2023-09-04 DIAGNOSIS — Z7984 Long term (current) use of oral hypoglycemic drugs: Secondary | ICD-10-CM

## 2023-09-04 MED ORDER — FREESTYLE LIBRE 3 PLUS SENSOR MISC
3 refills | Status: DC
Start: 2023-09-04 — End: 2024-02-17

## 2023-09-16 ENCOUNTER — Encounter: Payer: Self-pay | Admitting: Cardiovascular Disease

## 2023-09-16 ENCOUNTER — Ambulatory Visit: Payer: PPO | Attending: Cardiovascular Disease | Admitting: Cardiovascular Disease

## 2023-09-16 VITALS — BP 108/82 | HR 52 | Ht 62.0 in | Wt 156.4 lb

## 2023-09-16 DIAGNOSIS — I471 Supraventricular tachycardia, unspecified: Secondary | ICD-10-CM

## 2023-09-16 DIAGNOSIS — I5022 Chronic systolic (congestive) heart failure: Secondary | ICD-10-CM

## 2023-09-16 NOTE — Progress Notes (Signed)
Electrophysiology Office Note:    Date:  09/16/2023   ID:  YESSENIA NYS, DOB May 11, 1971, MRN [id number]  PCP:  Josph Macho, FNP   [facility] Providers Cardiologist:  Arvilla Meres, MD Electrophysiologist:  Maurice Small, MD     Referring MD: Dolores Patty, MD   History of Present Illness:    Julie Hernandez is a 52 y.o. female with a medical history significant for CHFrEF, referred for ICD placement.     Discussed the use of AI scribe software for clinical note transcription with the patient, who gave verbal consent to proceed.  The patient's EF has been persistently less than 30% despite GMT, and there is a significant area of scar tissue. The patient has been experiencing frequent PVCs, which have been causing discomfort and concern. The patient reports that these PVCs can come and go, sometimes lasting for weeks at a time. The patient also reports feeling extremely tired, especially when the PVCs are frequent. The patient lives alone and expresses concern about the potential for a serious cardiac event when alone. The patient also has back issues and uses a home tanning bed for heat therapy.     Today, she reports that she is doing reasonably well.  She has been having palpitations attributable to PVCs today.  She has not had syncope, presyncope, recent chest pain.  EKGs/Labs/Other Studies Reviewed Today:     Echocardiogram:  TTE January 11, 2023 EF 25 to 30%.  Global hypokinesis with anteroseptal wall motion abnormality.  Grade 2 diastolic dysfunction.  Moderately dilated left atrium     Advanced imaging:  Cardiac MRI June 25, 2023 EF 26%.  The anterior septum is largely akinetic and the apex is akinetic; LGE consistent with prior LAD infarct  Cardiac catherization  Reviewed in Epic   EKG:   EKG Interpretation Date/Time:  Monday September 16 2023 11:24:03 EDT Ventricular Rate:  82 PR Interval:  130 QRS Duration:  98 QT  Interval:  414 QTC Calculation: 483 R Axis:   0  Text Interpretation: Sinus rhythm with frequent Premature ventricular complexes Minimal voltage criteria for LVH, may be normal variant ( R in aVL ) ST & T wave abnormality, consider lateral ischemia Prolonged QT When compared with ECG of 05-Jun-2023 14:56, Premature ventricular complexes are now Present Confirmed by York Pellant [phone]) on 09/16/2023 11:33:49 AM     Physical Exam:    VS:  BP 108/82 (BP Location: Left Arm, Patient Position: Sitting, Cuff Size: Normal)   Pulse (!) 52   Ht 5\' 2"  (1.575 m)   Wt 156 lb 6.4 oz (70.9 kg)   SpO2 99%   BMI 28.61 kg/m     Wt Readings from Last 3 Encounters:  09/16/23 156 lb 6.4 oz (70.9 kg)  06/05/23 162 lb 9.6 oz (73.8 kg)  05/30/23 161 lb 6.4 oz (73.2 kg)     GEN:  Well nourished, well developed in no acute distress CARDIAC: RRR, no murmurs, rubs, gallops RESPIRATORY:  Normal work of breathing MUSCULOSKELETAL: no edema    ASSESSMENT & PLAN:     Ischemic cardiomyopathy EF persistently less than 30% despite GDMT Significant area of scar She meets criteria for ICD for primary prevention Continue empagliflozin 10, metoprolol XL 25, spironolactone 25 Has had BP issues with Entresto  Frequent PVCs Symptomatic with palpitations - 8% burden on monitor 01/2023 Will assess burden Possible ablation or antiarrhythmic drug in the future  Coronary artery disease Continue Plavix 75, aspirin  81, Zetia 10, metoprolol XL Has statin allergy  SVT Will monitor; per patient, rates are typically in the 170s to 180s    Signed, Maurice Small, MD  09/16/2023 11:53 AM    [facility]

## 2023-09-16 NOTE — Patient Instructions (Addendum)
Medication Instructions:  Your physician recommends that you continue on your current medications as directed. Please refer to the Current Medication list given to you today. *If you need a refill on your cardiac medications before your next appointment, please call your pharmacy*   Lab Work: CBC and BMET today If you have labs (blood work) drawn today and your tests are completely normal, you will receive your results only by: MyChart Message (if you have MyChart) OR A paper copy in the mail If you have any lab test that is abnormal or we need to change your treatment, we will call you to review the results.   Testing/Procedures: ICD implant - scheduled for Tuesday, November 19th  Your physician has recommended that you have a defibrillator inserted. An implantable cardioverter defibrillator (ICD) is a small device that is placed in your chest or, in rare cases, your abdomen. This device uses electrical pulses or shocks to help control life-threatening, irregular heartbeats that could lead the heart to suddenly stop beating (sudden cardiac arrest). Leads are attached to the ICD that goes into your heart. This is done in the hospital and usually requires an overnight stay. Please see the instruction sheet given to you today for more information.   Follow-Up: At [facility], you and your health needs are our priority.  As part of our continuing mission to provide you with exceptional heart care, we have created designated Provider Care Teams.  These Care Teams include your primary Cardiologist (physician) and Advanced Practice Providers (APPs -  Physician Assistants and Nurse Practitioners) who all work together to provide you with the care you need, when you need it.  We recommend signing up for the patient portal called "MyChart".  Sign up information is provided on this After Visit Summary.  MyChart is used to connect with patients for Virtual Visits (Telemedicine).  Patients are  able to view lab/test results, encounter notes, upcoming appointments, etc.  Non-urgent messages can be sent to your provider as well.   To learn more about what you can do with MyChart, go to [email or web address].    Your next appointment:   We will schedule follow up after your ICD implant  Provider:   York Pellant, MD

## 2023-09-17 LAB — CBC
Hematocrit: 50.4 % — ABNORMAL HIGH (ref 34.0–46.6)
Hemoglobin: 14.6 g/dL (ref 11.1–15.9)
MCH: 25 pg — ABNORMAL LOW (ref 26.6–33.0)
MCHC: 29 g/dL — ABNORMAL LOW (ref 31.5–35.7)
MCV: 86 fL (ref 79–97)
Platelets: 386 10*3/uL (ref 150–450)
RBC: 5.84 x10E6/uL — ABNORMAL HIGH (ref 3.77–5.28)
RDW: 15.2 % (ref 11.7–15.4)
WBC: 8.4 10*3/uL (ref 3.4–10.8)

## 2023-09-17 LAB — BASIC METABOLIC PANEL
BUN/Creatinine Ratio: 15 (ref 9–23)
BUN: 15 mg/dL (ref 6–24)
CO2: 23 mmol/L (ref 20–29)
Calcium: 10.3 mg/dL — ABNORMAL HIGH (ref 8.7–10.2)
Chloride: 98 mmol/L (ref 96–106)
Creatinine, Ser: 1.01 mg/dL — ABNORMAL HIGH (ref 0.57–1.00)
Glucose: 79 mg/dL (ref 70–99)
Potassium: 4.6 mmol/L (ref 3.5–5.2)
Sodium: 144 mmol/L (ref 134–144)
eGFR: 67 mL/min/{1.73_m2} (ref >59)

## 2023-09-25 ENCOUNTER — Encounter (HOSPITAL_COMMUNITY): Payer: Self-pay

## 2023-09-25 ENCOUNTER — Ambulatory Visit (HOSPITAL_COMMUNITY)
Admission: RE | Admit: 2023-09-25 | Discharge: 2023-09-25 | Disposition: A | Discharge status: Home / Self Care | Payer: PPO | Source: Ambulatory Visit | Attending: Family Medicine | Admitting: Family Medicine

## 2023-09-25 VITALS — BP 118/74 | HR 89 | Wt 160.4 lb

## 2023-09-25 DIAGNOSIS — G4733 Obstructive sleep apnea (adult) (pediatric): Secondary | ICD-10-CM | POA: Insufficient documentation

## 2023-09-25 DIAGNOSIS — I255 Ischemic cardiomyopathy: Secondary | ICD-10-CM | POA: Diagnosis not present

## 2023-09-25 DIAGNOSIS — I252 Old myocardial infarction: Secondary | ICD-10-CM | POA: Diagnosis not present

## 2023-09-25 DIAGNOSIS — R0989 Other specified symptoms and signs involving the circulatory and respiratory systems: Secondary | ICD-10-CM | POA: Insufficient documentation

## 2023-09-25 DIAGNOSIS — I493 Ventricular premature depolarization: Secondary | ICD-10-CM | POA: Diagnosis not present

## 2023-09-25 DIAGNOSIS — I5022 Chronic systolic (congestive) heart failure: Secondary | ICD-10-CM | POA: Insufficient documentation

## 2023-09-25 DIAGNOSIS — M549 Dorsalgia, unspecified: Secondary | ICD-10-CM | POA: Diagnosis not present

## 2023-09-25 DIAGNOSIS — E119 Type 2 diabetes mellitus without complications: Secondary | ICD-10-CM | POA: Diagnosis not present

## 2023-09-25 DIAGNOSIS — Z955 Presence of coronary angioplasty implant and graft: Secondary | ICD-10-CM | POA: Diagnosis not present

## 2023-09-25 DIAGNOSIS — E785 Hyperlipidemia, unspecified: Secondary | ICD-10-CM | POA: Diagnosis not present

## 2023-09-25 DIAGNOSIS — E118 Type 2 diabetes mellitus with unspecified complications: Secondary | ICD-10-CM

## 2023-09-25 DIAGNOSIS — I471 Supraventricular tachycardia, unspecified: Secondary | ICD-10-CM | POA: Diagnosis not present

## 2023-09-25 DIAGNOSIS — E669 Obesity, unspecified: Secondary | ICD-10-CM | POA: Diagnosis not present

## 2023-09-25 DIAGNOSIS — J449 Chronic obstructive pulmonary disease, unspecified: Secondary | ICD-10-CM | POA: Diagnosis present

## 2023-09-25 DIAGNOSIS — Z87891 Personal history of nicotine dependence: Secondary | ICD-10-CM

## 2023-09-25 DIAGNOSIS — F17290 Nicotine dependence, other tobacco product, uncomplicated: Secondary | ICD-10-CM | POA: Insufficient documentation

## 2023-09-25 DIAGNOSIS — I2510 Atherosclerotic heart disease of native coronary artery without angina pectoris: Secondary | ICD-10-CM | POA: Diagnosis present

## 2023-09-25 DIAGNOSIS — R9431 Abnormal electrocardiogram [ECG] [EKG]: Secondary | ICD-10-CM | POA: Diagnosis not present

## 2023-09-25 DIAGNOSIS — E782 Mixed hyperlipidemia: Secondary | ICD-10-CM

## 2023-09-25 DIAGNOSIS — Z6829 Body mass index (BMI) 29.0-29.9, adult: Secondary | ICD-10-CM | POA: Insufficient documentation

## 2023-09-25 NOTE — Progress Notes (Signed)
[facility] Note  Date:  01/02/2021   ID:  Julie Hernandez, DOB Nov 27, 1970, MRN [id number]  Location: Home  Provider location: [facility] Type of Visit: Established patient  PCP:  Josph Macho, FNP  HF Cardiologist: Dr. Gala Romney  Chief Complaint: Heart Failure follow-up   HPI: Julie Hernandez is a 52 y.o. woman with COPD/ongoing tobacco abuse (vaping), premature CAD s/p anterior MI, diet-controlled DM2, chronic back pain, and systolic HF due to [facility] referred for further evaluation of CAD and systolic HF.  Had anterior MI in 2010. LAD stent at [facility]. Went to [facility] that same year and had  a second stent placed. Did well from a cardiac perspective until 3/21.   Cath 02/08/20 at [facility] for NSTEMI. 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 Was told she may need repeat stenting vs CABG. Referred here for second opinion.   We saw her in 5/21 with daily angina. Underwent MRI which showed EF 26% only minimal viability in anterior wall. Decision to proceed with PCI of LCX and LAD (ISR) over CABG. Had successful PCI of LCX and LAD with Dr. Excell Seltzer on 5/20.   Echo 9/21 EF 60-65%.  Echo 04/06/21 EF 50-55% mild anterior and apical HK  Admitted 10/23 with NSTEMI. Echo EF back down to 20% and RV mildly reduced. Underwent R/LHC which showed elevated right and left filling pressures and low CO;  occluded LAD stent and new CTO of RCA with L>>R collaterals. Underwent staged PCI with DES to mid LAD.   Seen in ED 12/25/22 with  SVT,  Echo 01/11/23 EF 25-30% with WMA c/w previous LAD infarct Sleep  01/08/23 Moderate OSA 20.6 CSA 1.6  Zio 2/24 50 runs NSVT (longest 11 beats) 8.5% PVCs  cMRI 8/24 showed LVEF 26%, RVEF 39%, large anterior infarct--> referred to EP for ICD.  Today she returns for HF follow up. Overall  feeling fatigued. She ha atypical chest "heaviness" she attributes to anxiety. She is feeling a lot of palpitations. No increased SOB, does ok with work and walking up steps. Denies abnormal bleeding, dizziness, edema, or PND/Orthopnea. Appetite ok. No fever or chills. Weight at home 156-160 pounds. Taking all medications. Still vaping, no drugs or regular ETOH. Saw EP recently, did not get questions about procedure answered. Scare and unsure how to proceed.   Cardiac Studies - cMRI (8/24): showed LVEF 26%, RVEF 39%, large anterior infarct  - R/LHC (10/23): RA 23 mean, PA 48/35 mean 39, PCWP mean 30, CO/CI (Fick) 3.7/2 Severe 2v CAD with 90% in stent re-stenonsis of mid LAD and CTO of mid RCA with L>>R collaterals  - Echo (10/23): EF 20%, RV mildly down.  - Echo (5/22): EF 50-55%, mid anterior and apical HK  - Echo (9/21): EF 60-65%  - Zio (7/21) 1. Sinus rhythm - avg HR of 82 2. 13 Ventricular Tachycardia runs occurred, the run with the fastest interval lasting 12 beats with a max rate of 139 bpm (avg 110 bpm); the run with the fastest interval was also the longest 3. Very frequents PVCs (32.2%, [id number]), VE Couplets were rare (<1.0%, 2118),  4. Ventricular Bigeminy and Trigeminy were present. 5.. Multiple patient-triggered events associated  - cMRI (03/29/20) 1. Subendocardial late gadolinium enhancement consistent with prior infarct in the LAD territory. There is >50%  transmural LGE suggesting nonviability in the basal to apical anterior wall and apex. There is <50% transmural LGE suggesting viability in the basal to mid anteroseptum and apical septum  2.  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 Normal LV size with severe systolic dysfunction (EF 26%). Akinesis of basal to apical anterior/anteroseptal walls and apex   4.  Small RV size with normal systolic function (EF 62%)  Past Medical History:  Diagnosis Date   CHF (congestive heart failure) (HCC)    Coronary artery  disease    COVID 12/03/2020   Diabetes mellitus without complication (HCC)    Hypertension    OSA on CPAP    moderate obstructive sleep apnea with an AHI of 20.6/h and he was placed on auto CPAP from 4 to 15 cm H2O   Current Outpatient Medications  Medication Sig Dispense Refill   albuterol (VENTOLIN HFA) 108 (90 Base) MCG/ACT inhaler Inhale 2 puffs into the lungs every 4 (four) hours as needed for wheezing or shortness of breath.     aspirin EC 81 MG tablet Take 1 tablet (81 mg total) by mouth daily. 30 tablet 11   blood glucose meter kit and supplies Dispense based on patient and insurance preference. Use up to four times daily as directed. (FOR ICD-10 E10.9, E11.9). 1 each 1   clopidogrel (PLAVIX) 75 MG tablet Take 1 tablet (75 mg total) by mouth daily. 30 tablet 11   Continuous Glucose Sensor (FREESTYLE LIBRE 3 PLUS SENSOR) MISC Use to monitor glucose continuously as directed. Change sensor every 15 days 6 each 3   empagliflozin (JARDIANCE) 10 MG TABS tablet Take 10 mg by mouth daily.     escitalopram (LEXAPRO) 10 MG tablet Take 10 mg by mouth daily.     Evolocumab (REPATHA SURECLICK) 140 MG/ML SOAJ Inject 140 mg into the skin every 14 (fourteen) days. 6 mL 3   ezetimibe (ZETIA) 10 MG tablet Take 1 tablet (10 mg total) by mouth daily at 6pm. 30 tablet 11   famotidine (PEPCID) 40 MG tablet Take 40 mg by mouth daily as needed for heartburn.     furosemide (LASIX) 20 MG tablet Take 3 tablets (60 mg total) by mouth as needed. For weight gain of 3lb in 24hrs, 5lb in a week or edema 270 tablet 3   Insulin Pen Needle 32G X 4 MM MISC Use with insulin pen 100 each 0   metFORMIN (GLUCOPHAGE) 500 MG tablet Take 2 tablets (1,000 mg total) by mouth 2 (two) times daily with a meal. 360 tablet 3   metoprolol succinate (TOPROL XL) 25 MG 24 hr tablet Take 1 tablet (25 mg total) by mouth daily. 30 tablet 1   nitroGLYCERIN (NITROSTAT) 0.4 MG SL tablet PLACE 1 TABLET UNDER THE TONGUE AND ALLOW TO DISSOLVE  SLOWLY, DO NOT CHEW OR SWALLOW. YOU MAY USE ADDITIONAL TABLETS EVERY 5 MINUTES BUT NO MORE THAN 3 TABLETS. 25 tablet 1   ondansetron (ZOFRAN ODT) 4 MG disintegrating tablet Take 1 tablet (4 mg total) by mouth every 8 (eight) hours as needed for nausea or vomiting. 10 tablet 0   potassium chloride SA (KLOR-CON M) 20 MEQ tablet Take 1 tablet (20 mEq total) by mouth daily. 90 tablet 3   RABEprazole (ACIPHEX) 20 MG tablet Take 40 mg by mouth daily.     Semaglutide, 1 MG/DOSE, 4 MG/3ML SOPN Inject 1 mg as directed once a week. 9 mL 1   spironolactone (ALDACTONE) 25 MG tablet  Take 1 tablet (25 mg total) by mouth daily. 30 tablet 11   No current facility-administered medications for this encounter.   Allergies  Allergen Reactions   Esomeprazole Magnesium Anaphylaxis   Ciprofibrate Itching   Ciprofloxacin Itching   Gabapentin Other (See Comments)    Achy - myalgia   Lubiprostone Diarrhea   Statins     Myalgia - flu like symptoms   Amlodipine Rash   Iron Rash and Swelling   Latex Rash and Swelling   Povidone Iodine Rash   Social History   Socioeconomic History   Marital status: Divorced    Spouse name: Not on file   Number of children: 2   Years of education: Not on file   Highest education level: Not on file  Occupational History   Occupation: On disability  Tobacco Use   Smoking status: Former    Current packs/day: 0.00    Types: Cigarettes    Quit date: 02/18/2020    Years since quitting: 3.6   Smokeless tobacco: Never  Vaping Use   Vaping status: Some Days  Substance and Sexual Activity   Alcohol use: Yes    Comment: SOCIAL   Drug use: Never   Sexual activity: Not on file  Other Topics Concern   Not on file  Social History Narrative   Not on file   Social Determinants of Health   Financial Resource Strain: Not on file  Food Insecurity: Not on file  Transportation Needs: Not on file  Physical Activity: Not on file  Stress: Not on file  Social Connections: Not on  file  Intimate Partner Violence: Not on file   FHx: M died at 53 from MI and HF D had stroke No brother and sisters  BP 118/74   Pulse 89   Wt 72.8 kg (160 lb 6.4 oz)   SpO2 99%   BMI 29.34 kg/m   Wt Readings from Last 3 Encounters:  09/25/23 72.8 kg (160 lb 6.4 oz)  09/16/23 70.9 kg (156 lb 6.4 oz)  06/05/23 73.8 kg (162 lb 9.6 oz)   PHYSICAL EXAM General:  NAD. No resp difficulty, walked into clinic HEENT: Normal Neck: Supple. No JVD. Carotids 2+ bilat; no bruits. No lymphadenopathy or thryomegaly appreciated. Cor: PMI nondisplaced. Regular rate & rhythm. No rubs, gallops or murmurs. Lungs: Clear Abdomen: Soft, obese, nontender, nondistended. No hepatosplenomegaly. No bruits or masses. Good bowel sounds. Extremities: No cyanosis, clubbing, rash, edema Neuro: Alert & oriented x 3, cranial nerves grossly intact. Moves all 4 extremities w/o difficulty. Mildly anxious  ECG (personally reviewed): SR with PVCs  ASSESSMENT & PLAN: 1. CAD - prior anterior MI w/ h/o PCI to mLAD and mLCX - Echo w/ drop in EF, from 55% >> 20%, RV mildly reduced - Cath (10/23): 90% in-stent restenosis of mLAD and CTO mRCA with left-to-right collaterals. LCx stent widely patent. -> S/P PCI DES mid LAD. - Having recurrent CP with typical and atypical features. Pattern is relatively stable. If symptoms progress will need repeat cath with Dr. Excell Seltzer - Continue DAPT. Lipids managed by Lipid Clinic   2. Chronic Systolic Heart Failure - Previous history of ICM following prior anterior MI.  - EF previously 25-30%, improved post LAD and LCx PCI - Echo (9/21): EF 60-65% - Echo (04/06/21): EF 50-55% mild anterior and apical HK - Echo (10/23): EF down 20%, RV mildly reduced in setting of LAD and RCA infarcts, previously placed mLAD stent w/ 99% stenosis, CTO mRCA w/ L>R collaterals  -  RHC (10/23):  w/ elevated R+ L filling pressures and low output, RA 23, PCWP 30, CI 2.0  - Echo 01/11/23 EF 25-30% with WMA c/w  previous LAD infarct - cMRI (8/24): LVEF 26%, RVEF 39%, large anterior infarct - NYHA II-IIb, volume ok on PRN lasix. - Continue Toprol XL 25 mg daily. - Off Entresto 24/26 mg bid due to low BP. Will try to get on low-dose losartan as BP tolerates - Continue spironolactone 25 mg daily.   - Continue Jardiance/Ozempic - EF remains < 35%. She has seen EP for ICD. She has more questions, will refer back to EP. Called and discussed scheduling with Eli Phillips. - Recent labs reviewed and are stable, K 4.6, SCr 1.01  3. SVT/PVCs - Zio (05/2020) showed mostly SR, frequent PVCs (32% burden), 13 runs of VT - Zio 2/24 50 runs NSVT (longest 11 beats) 8.5% PVCs - Continue current therapy - Has seen EP, ? Ablation vs AAD.  4. DM2 - improving control - Hgb A1c 13.1 (11/23)--> 8.5 (1/24) - Followed by PCP/ENDO - Continue SGLT2i and Ozempic   5. HL w/ LDL Goal < 55 - Statin intolerant. - Continue Repatha + Zetia - Followed by Lipid Clinic   6. H/o Tobacco Use - Quit cigarettes, currently vapes  - Discussed need for cessation  7. Obesity - Body mass index is 29.34 kg/m. - Now on Ozempic  8. OSA - Sleep study 01/08/23 Moderate OSA 20.6 CSA 1.6 - Continue CPAP  - Follows with Dr. Mayford Knife  9. Carotid bruit - Carotid u/s with no significant dz  Follow up in 3 months with Dr. Gala Romney. Will work on arranging re-referral to EP for ICD.  Jacklynn Ganong, FNP  11:47 AM

## 2023-09-25 NOTE — Patient Instructions (Signed)
Referrals:  REFERRAL PLACED TO ELECTROPHYSIOLOGY THEY WILL CALL WITH APPOINTMENT INFORMATION   Follow-Up in: 3 MONTHS WITH DR. Gala Romney PLEASE CALL OUR OFFICE AROUND JANUARY TO GET SCHEDULED FOR YOUR APPOINTMENT. PHONE NUMBER IS [phone] OPTION 2   At the [facility], you and your health needs are our priority. We have a designated team specialized in the treatment of Heart Failure. This Care Team includes your primary Heart Failure Specialized Cardiologist (physician), Advanced Practice Providers (APPs- Physician Assistants and Nurse Practitioners), and Pharmacist who all work together to provide you with the care you need, when you need it.   You may see any of the following providers on your designated Care Team at your next follow up:  Dr. Arvilla Meres Dr. Marca Ancona Dr. Dorthula Nettles Dr. Theresia Bough Tonye Becket, NP Robbie Lis, [name] [facility] [place], [name] Brynda Peon, NP [place] Lee, NP Karle Plumber, [name]  Please be sure to bring in all your medications bottles to every appointment.   Need to Contact us:  If you have any questions or concerns before your next appointment please send [place] a message through [place] or call our office at [phone].    TO LEAVE A MESSAGE FOR THE NURSE SELECT OPTION 2, PLEASE LEAVE A MESSAGE INCLUDING: YOUR NAME DATE OF BIRTH CALL BACK NUMBER REASON FOR CALL**this is important as we prioritize the call backs  YOU WILL RECEIVE A CALL BACK THE SAME DAY AS LONG AS YOU CALL BEFORE 4:00 PM

## 2023-09-30 ENCOUNTER — Ambulatory Visit: Payer: PPO | Admitting: Nurse Practitioner

## 2023-09-30 ENCOUNTER — Encounter: Payer: Self-pay | Admitting: Nurse Practitioner

## 2023-09-30 VITALS — BP 105/68 | HR 81 | Ht 62.0 in | Wt 154.8 lb

## 2023-09-30 DIAGNOSIS — Z7984 Long term (current) use of oral hypoglycemic drugs: Secondary | ICD-10-CM | POA: Diagnosis not present

## 2023-09-30 DIAGNOSIS — E1159 Type 2 diabetes mellitus with other circulatory complications: Secondary | ICD-10-CM

## 2023-09-30 DIAGNOSIS — Z7985 Long-term (current) use of injectable non-insulin antidiabetic drugs: Secondary | ICD-10-CM

## 2023-09-30 DIAGNOSIS — Z794 Long term (current) use of insulin: Secondary | ICD-10-CM | POA: Diagnosis not present

## 2023-09-30 DIAGNOSIS — E1165 Type 2 diabetes mellitus with hyperglycemia: Secondary | ICD-10-CM

## 2023-09-30 LAB — POCT GLYCOSYLATED HEMOGLOBIN (HGB A1C): Hemoglobin A1C: 6.2 % — AB (ref 4.0–5.6)

## 2023-09-30 MED ORDER — METFORMIN HCL 500 MG PO TABS
500.0000 mg | ORAL_TABLET | Freq: Two times a day (BID) | ORAL | Status: DC
Start: 2023-09-30 — End: 2024-02-17

## 2023-09-30 NOTE — Progress Notes (Signed)
Endocrinology Follow Up Note       09/30/2023, 3:09 PM   Subjective:    Patient ID: Julie Hernandez, female    DOB: 1971/06/18.  Julie Hernandez is being seen in follow up after being seen in consultation for management of currently uncontrolled symptomatic diabetes requested by  Josph Macho, FNP.   Past Medical History:  Diagnosis Date   CHF (congestive heart failure) (HCC)    Coronary artery disease    COVID 12/03/2020   Diabetes mellitus without complication (HCC)    Hypertension    OSA on CPAP    moderate obstructive sleep apnea with an AHI of 20.6/h and he was placed on auto CPAP from 4 to 15 cm H2O    Past Surgical History:  Procedure Laterality Date   CORONARY BALLOON ANGIOPLASTY N/A 04/07/2020   Procedure: CORONARY BALLOON ANGIOPLASTY;  Surgeon: Tonny Bollman, MD;  Location: [facility] INVASIVE CV LAB;  Service: Cardiovascular;  Laterality: N/A;   CORONARY PRESSURE/FFR STUDY N/A 04/07/2020   Procedure: INTRAVASCULAR PRESSURE WIRE/FFR STUDY;  Surgeon: Tonny Bollman, MD;  Location: [facility] INVASIVE CV LAB;  Service: Cardiovascular;  Laterality: N/A;   CORONARY STENT INTERVENTION N/A 04/07/2020   Procedure: CORONARY STENT INTERVENTION;  Surgeon: Tonny Bollman, MD;  Location: [facility] INVASIVE CV LAB;  Service: Cardiovascular;  Laterality: N/A;   CORONARY STENT INTERVENTION N/A 09/18/2022   Procedure: CORONARY STENT INTERVENTION;  Surgeon: Tonny Bollman, MD;  Location: [facility] INVASIVE CV LAB;  Service: Cardiovascular;  Laterality: N/A;   CORONARY ULTRASOUND/IVUS N/A 04/07/2020   Procedure: Intravascular Ultrasound/IVUS;  Surgeon: Tonny Bollman, MD;  Location: [facility] INVASIVE CV LAB;  Service: Cardiovascular;  Laterality: N/A;   LEFT HEART CATH AND CORONARY ANGIOGRAPHY N/A 04/07/2020   Procedure: LEFT HEART CATH AND CORONARY ANGIOGRAPHY;  Surgeon: Tonny Bollman, MD;  Location: [facility] INVASIVE CV LAB;  Service: Cardiovascular;   Laterality: N/A;   LEFT HEART CATH AND CORONARY ANGIOGRAPHY N/A 04/14/2021   Procedure: LEFT HEART CATH AND CORONARY ANGIOGRAPHY;  Surgeon: Dolores Patty, MD;  Location: [facility] INVASIVE CV LAB;  Service: Cardiovascular;  Laterality: N/A;   RIGHT/LEFT HEART CATH AND CORONARY ANGIOGRAPHY N/A 09/17/2022   Procedure: RIGHT/LEFT HEART CATH AND CORONARY ANGIOGRAPHY;  Surgeon: Yvonne Kendall, MD;  Location: [facility] INVASIVE CV LAB;  Service: Cardiovascular;  Laterality: N/A;    Social History   Socioeconomic History   Marital status: Divorced    Spouse name: Not on file   Number of children: 2   Years of education: Not on file   Highest education level: Not on file  Occupational History   Occupation: On disability  Tobacco Use   Smoking status: Former    Current packs/day: 0.00    Types: Cigarettes    Quit date: 02/18/2020    Years since quitting: 3.6   Smokeless tobacco: Never  Vaping Use   Vaping status: Some Days  Substance and Sexual Activity   Alcohol use: Yes    Comment: SOCIAL   Drug use: Never   Sexual activity: Not on file  Other Topics Concern   Not on file  Social History Narrative   Not on file   Social Determinants of Health  Financial Resource Strain: Not on file  Food Insecurity: Not on file  Transportation Needs: Not on file  Physical Activity: Not on file  Stress: Not on file  Social Connections: Not on file    Family History  Problem Relation Age of Onset   Diabetes Mother    Hypertension Mother    Heart disease Mother    Hyperlipidemia Mother     Outpatient Encounter Medications as of 09/30/2023  Medication Sig   albuterol (VENTOLIN HFA) 108 (90 Base) MCG/ACT inhaler Inhale 2 puffs into the lungs every 4 (four) hours as needed for wheezing or shortness of breath.   aspirin EC 81 MG tablet Take 1 tablet (81 mg total) by mouth daily.   blood glucose meter kit and supplies Dispense based on patient and insurance preference. Use up to four times daily as  directed. (FOR ICD-10 E10.9, E11.9).   clopidogrel (PLAVIX) 75 MG tablet Take 1 tablet (75 mg total) by mouth daily.   Continuous Glucose Sensor (FREESTYLE LIBRE 3 PLUS SENSOR) MISC Use to monitor glucose continuously as directed. Change sensor every 15 days   empagliflozin (JARDIANCE) 10 MG TABS tablet Take 10 mg by mouth daily.   escitalopram (LEXAPRO) 10 MG tablet Take 10 mg by mouth daily.   Evolocumab (REPATHA SURECLICK) 140 MG/ML SOAJ Inject 140 mg into the skin every 14 (fourteen) days.   ezetimibe (ZETIA) 10 MG tablet Take 1 tablet (10 mg total) by mouth daily at 6pm.   famotidine (PEPCID) 40 MG tablet Take 40 mg by mouth daily as needed for heartburn.   furosemide (LASIX) 20 MG tablet Take 3 tablets (60 mg total) by mouth as needed. For weight gain of 3lb in 24hrs, 5lb in a week or edema   Insulin Pen Needle 32G X 4 MM MISC Use with insulin pen   metoprolol succinate (TOPROL XL) 25 MG 24 hr tablet Take 1 tablet (25 mg total) by mouth daily.   nitroGLYCERIN (NITROSTAT) 0.4 MG SL tablet PLACE 1 TABLET UNDER THE TONGUE AND ALLOW TO DISSOLVE SLOWLY, DO NOT CHEW OR SWALLOW. YOU MAY USE ADDITIONAL TABLETS EVERY 5 MINUTES BUT NO MORE THAN 3 TABLETS.   ondansetron (ZOFRAN ODT) 4 MG disintegrating tablet Take 1 tablet (4 mg total) by mouth every 8 (eight) hours as needed for nausea or vomiting.   potassium chloride SA (KLOR-CON M) 20 MEQ tablet Take 1 tablet (20 mEq total) by mouth daily.   RABEprazole (ACIPHEX) 20 MG tablet Take 40 mg by mouth daily.   Semaglutide, 1 MG/DOSE, 4 MG/3ML SOPN Inject 1 mg as directed once a week.   spironolactone (ALDACTONE) 25 MG tablet Take 1 tablet (25 mg total) by mouth daily.   [DISCONTINUED] metFORMIN (GLUCOPHAGE) 500 MG tablet Take 2 tablets (1,000 mg total) by mouth 2 (two) times daily with a meal.   metFORMIN (GLUCOPHAGE) 500 MG tablet Take 1 tablet (500 mg total) by mouth 2 (two) times daily with a meal.   No facility-administered encounter medications on  file as of 09/30/2023.    ALLERGIES: Allergies  Allergen Reactions   Esomeprazole Magnesium Anaphylaxis   Ciprofibrate Itching   Ciprofloxacin Itching   Gabapentin Other (See Comments)    Achy - myalgia   Lubiprostone Diarrhea   Statins     Myalgia - flu like symptoms   Amlodipine Rash   Iron Rash and Swelling   Latex Rash and Swelling   Povidone Iodine Rash    VACCINATION STATUS:  There is no immunization  history on file for this patient.  Diabetes She presents for her follow-up diabetic visit. She has type 2 diabetes mellitus. Onset time: diagnosed with GD at ate 27 then diabetes at age 16. Her disease course has been improving. There are no hypoglycemic associated symptoms. Associated symptoms include fatigue. There are no hypoglycemic complications. Symptoms are stable. 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 Risk factors for coronary artery disease include tobacco exposure, diabetes mellitus, dyslipidemia, family history, obesity and hypertension. Current diabetic treatment includes oral agent (dual therapy) (And Ozempic). She is compliant with treatment most of the time. Her weight is decreasing steadily. She is following a generally healthy diet. Meal planning includes avoidance of concentrated sweets. She has not had a previous visit with a dietitian. She participates in exercise three times a week. Her home blood glucose trend is decreasing steadily. Her overall blood glucose range is 110-130 mg/dl. (She presents today with her CGM showing at goal glycemic profile overall.  Her POCT A1c today is 6.2%, improving from last visit of 6.4%. Analysis of her CGM shows TIR 99%, TAR 1%, TBR 0% with a GMI of 6%.  She is being evaluated for ICD placement soon.) An ACE inhibitor/angiotensin II receptor blocker is not being taken. She sees a podiatrist.Eye exam is not current (due now).     Review of systems  Constitutional: + steadily decreasing body weight,  current Body mass index is 28.31 kg/m., no fatigue, no subjective hyperthermia, no subjective hypothermia Eyes: no blurry vision, no xerophthalmia ENT: no sore throat, no nodules palpated in throat, no dysphagia/odynophagia, no hoarseness Cardiovascular: no chest pain, no shortness of breath, no palpitations, no leg swelling Respiratory: no cough, no shortness of breath Gastrointestinal: no nausea/vomiting/diarrhea, + intermittent constipation(Hx IBS-C) Musculoskeletal: no muscle/joint aches Skin: no rashes, no hyperemia Neurological: no tremors, no numbness, no tingling, no dizziness Psychiatric: no depression, no anxiety  Objective:     BP 105/68 (BP Location: Right Arm, Patient Position: Sitting, Cuff Size: Large)   Pulse 81   Ht 5\' 2"  (1.575 m)   Wt 154 lb 12.8 oz (70.2 kg)   BMI 28.31 kg/m   Wt Readings from Last 3 Encounters:  09/30/23 154 lb 12.8 oz (70.2 kg)  09/25/23 160 lb 6.4 oz (72.8 kg)  09/16/23 156 lb 6.4 oz (70.9 kg)     BP Readings from Last 3 Encounters:  09/30/23 105/68  09/25/23 118/74  09/16/23 108/82      Physical Exam- Limited  Constitutional:  Body mass index is 28.31 kg/m. , not in acute distress, normal state of mind Eyes:  EOMI, no exophthalmos Musculoskeletal: no gross deformities, strength intact in all four extremities, no gross restriction of joint movements Skin:  no rashes, no hyperemia Neurological: no tremor with outstretched hands   Diabetic Foot Exam - Simple   No data filed     CMP ( most recent) CMP     Component Value Date/Time   NA 144 09/16/2023 1227   K 4.6 09/16/2023 1227   CL 98 09/16/2023 1227   CO2 23 09/16/2023 1227   GLUCOSE 79 09/16/2023 1227   GLUCOSE 101 (H) 04/04/2023 1440   BUN 15 09/16/2023 1227   CREATININE 1.01 (H) 09/16/2023 1227   CALCIUM 10.3 (H) 09/16/2023 1227   PROT 7.0 12/28/2022 1154   PROT 6.2 06/21/2020 0922   ALBUMIN 4.2 12/28/2022 1154   ALBUMIN 4.2 06/21/2020 0922   AST 19  12/28/2022 1154   ALT 16  12/28/2022 1154   ALKPHOS 68 12/28/2022 1154   BILITOT 0.7 12/28/2022 1154   BILITOT <0.2 06/21/2020 0922   GFRNONAA >60 04/04/2023 1440   GFRAA >60 07/26/2020 1208     Diabetic Labs (most recent): Lab Results  Component Value Date   HGBA1C 6.2 (A) 09/30/2023   HGBA1C 6.4 (A) 05/30/2023   HGBA1C 8.5 (H) 11/29/2022     Lipid Panel ( most recent) Lipid Panel     Component Value Date/Time   CHOL 157 09/17/2022 0659   CHOL 113 06/21/2020 0922   TRIG 188 (H) 09/17/2022 0659   HDL 33 (L) 09/17/2022 0659   HDL 33 (L) 06/21/2020 0922   CHOLHDL 4.8 09/17/2022 0659   VLDL 38 09/17/2022 0659   LDLCALC 86 09/17/2022 0659   LDLCALC 54 06/21/2020 0922   LABVLDL 26 06/21/2020 0922      Lab Results  Component Value Date   TSH 1.125 12/28/2022           Assessment & Plan:   1) Type 2 diabetes mellitus with hyperglycemia, with long-term current use of insulin (HCC)  She presents today with her CGM showing at goal glycemic profile overall.  Her POCT A1c today is 6.2%, improving from last visit of 6.4%. Analysis of her CGM shows TIR 99%, TAR 1%, TBR 0% with a GMI of 6%.  She is being evaluated for ICD placement soon.  She reports her weight loss has plateaued.  - Julie Hernandez has currently uncontrolled symptomatic type 2 DM since 52 years of age.   -Recent labs reviewed.  - I had a long discussion with her about the progressive nature of diabetes and the pathology behind its complications. 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 These are all discussed in detail with her.  The following Lifestyle Medicine recommendations according to American College of Lifestyle Medicine [facility]) were discussed and offered to patient and she agrees to start the journey:  A. Whole Foods, Plant-based plate comprising  of fruits and vegetables, plant-based proteins, whole-grain carbohydrates was discussed in detail with the patient.   A list for source of those nutrients were also provided to the patient.  Patient will use only water or unsweetened tea for hydration. B.  The need to stay away from risky substances including alcohol, smoking; obtaining 7 to 9 hours of restorative sleep, at least 150 minutes of moderate intensity exercise weekly, the importance of healthy social connections,  and stress reduction techniques were discussed. C.  A full color page of  Calorie density of various food groups per pound showing examples of each food groups was provided to the patient.  - Nutritional counseling repeated at each appointment due to patients tendency to fall back in to old habits.  - The patient admits there is a room for improvement in their diet and drink choices. -  Suggestion is made for the patient to avoid simple carbohydrates from their diet including Cakes, Sweet Desserts / Pastries, Ice Cream, Soda (diet and regular), Sweet Tea, Candies, Chips, Cookies, Sweet Pastries, Store Bought Juices, Alcohol in Excess of 1-2 drinks a day, Artificial Sweeteners, Coffee Creamer, and "Sugar-free" Products. This will help patient to have stable blood glucose profile and potentially avoid unintended weight gain.   - I encouraged the patient to switch to unprocessed or minimally processed complex  starch and increased protein intake (animal or plant source), fruits, and vegetables.   - Patient is advised to stick to a routine mealtimes to eat 3 meals a day and avoid unnecessary snacks (to snack only to correct hypoglycemia).  - I have approached her with the following individualized plan to manage her diabetes and patient agrees:   -She is advised to continue her Ozempic 1 mg SQ weekly, lower Metformin to 500 mg po twice daily with meals, and continue Jardiance 10 mg po daily (per cardiology recommendations).    -she  is encouraged to continue monitoring glucose 4 times daily (using her CGM), before meals and before bed, and to call the clinic if she has readings less than 70 or above 300 for 3 tests in a row.  - she is warned not to take insulin without proper monitoring per orders. - Adjustment parameters are given to her for hypo and hyperglycemia in writing.  She is excellent candidate for incretin therapy with GLP1 given her MI history.  - Specific targets for  A1c; LDL, HDL, and Triglycerides were discussed with the patient.  2) Blood Pressure /Hypertension:  her blood pressure is controlled to target.   she is advised to continue her current medications as prescribed by cardiology.  3) Lipids/Hyperlipidemia:    Review of her recent lipid panel from 09/17/22 showed controlled LDL at 86 and elevated triglycerides of 188 .  she is advised to continue Zetia 10 mg daily at bedtime.  She has intolerance to statins.  4)  Weight/Diet:  her Body mass index is 28.31 kg/m.  -  clearly complicating her diabetes care.   she is a candidate for weight loss. I discussed with her the fact that loss of 5 - 10% of her  current body weight will have the most impact on her diabetes management.  Exercise, and detailed carbohydrates information provided  -  detailed on discharge instructions.  5) Chronic Care/Health Maintenance: -she is not on ACEI/ARB or Statin medications and is encouraged to initiate and continue to follow up with Ophthalmology, Dentist, Podiatrist at least yearly or according to recommendations, and advised to [facility] altogether . I have recommended yearly flu vaccine and pneumonia vaccine at least every 5 years; moderate intensity exercise for up to 150 minutes weekly; and sleep for at least 7 hours a day.  - she is advised to maintain close follow up with Josph Macho, FNP for primary care needs, as well as her other providers for optimal and coordinated care.     I spent  36   minutes in the care of the patient today including review of labs from CMP, Lipids, Thyroid Function, Hematology (current and previous including abstractions from other facilities); face-to-face time discussing  her blood glucose readings/logs, discussing hypoglycemia and hyperglycemia episodes and symptoms, medications doses, her options of short and long term treatment based on the latest standards of care / guidelines;  discussion about incorporating lifestyle medicine;  and documenting the encounter. Risk reduction counseling performed per USPSTF guidelines to reduce obesity and cardiovascular risk factors.     Please refer to Patient Instructions for Blood Glucose Monitoring and Insulin/Medications Dosing Guide"  in media tab for additional information. Please  also refer to " Patient Self Inventory" in the Media  tab for reviewed elements of pertinent patient history.  Julie Hernandez participated in the discussions, expressed understanding, and voiced agreement with the above plans.  All questions were answered to her satisfaction. she  is encouraged to contact clinic should she have any questions or concerns prior to her return visit.     Follow up plan: - Return in about 4 months (around 01/28/2024) for Diabetes F/U with A1c in office, No previsit labs.   Ronny Bacon, [facility] [address] [place], [place] [address] Phone: [phone] Fax: [phone]  09/30/2023, 3:09 PM

## 2023-10-01 ENCOUNTER — Telehealth: Payer: Self-pay | Admitting: Nurse Practitioner

## 2023-10-01 NOTE — Telephone Encounter (Signed)
Left VM that her pt assistance Ozempic was delivered to office today

## 2023-10-02 ENCOUNTER — Telehealth: Payer: Self-pay | Admitting: Nurse Practitioner

## 2023-10-02 NOTE — Telephone Encounter (Signed)
error 

## 2023-10-07 NOTE — Telephone Encounter (Signed)
Pt picked up pt assistance of ozempic

## 2023-10-08 ENCOUNTER — Ambulatory Visit (HOSPITAL_COMMUNITY): Admission: RE | Admit: 2023-10-08 | Payer: PPO | Source: Home / Self Care | Admitting: Cardiovascular Disease

## 2023-10-08 ENCOUNTER — Encounter (HOSPITAL_COMMUNITY): Admission: RE | Payer: Self-pay | Source: Home / Self Care

## 2023-10-08 SURGERY — ICD IMPLANT

## 2023-10-22 ENCOUNTER — Ambulatory Visit: Payer: PPO | Attending: Internal Medicine | Admitting: Internal Medicine

## 2023-10-22 ENCOUNTER — Encounter: Payer: Self-pay | Admitting: Internal Medicine

## 2023-10-22 VITALS — BP 114/64 | HR 89 | Ht 62.0 in | Wt 160.0 lb

## 2023-10-22 DIAGNOSIS — I255 Ischemic cardiomyopathy: Secondary | ICD-10-CM | POA: Diagnosis not present

## 2023-10-22 NOTE — Patient Instructions (Signed)
Medication Instructions:  Your physician recommends that you continue on your current medications as directed. Please refer to the Current Medication list given to you today.  *If you need a refill on your cardiac medications before your next appointment, please call your pharmacy*  Lab Work: None ordered.  If you have labs (blood work) drawn today and your tests are completely normal, you will receive your results only by: MyChart Message (if you have MyChart) OR A paper copy in the mail If you have any lab test that is abnormal or we need to change your treatment, we will call you to review the results.  Testing/Procedures: Dates for ICD procedure: Dec - 27 Jan - 6, 7, 21, 22, 28  Please call Stevie or April. Or Mychart our office.  Follow-Up: At [facility], you and your health needs are our priority.  As part of our continuing mission to provide you with exceptional heart care, we have created designated Provider Care Teams.  These Care Teams include your primary Cardiologist (physician) and Advanced Practice Providers (APPs -  Physician Assistants and Nurse Practitioners) who all work together to provide you with the care you need, when you need it.  Your next appointment:   To be scheduled  The format for your next appointment:   In Person  Provider:   Lewayne Bunting, MD{or one of the following Advanced Practice Providers on your designated Care Team:   Francis Dowse, [place] Casimiro Needle "Mardelle Matte" [place], [place] Earnest Rosier, NP  Important Information About Sugar

## 2023-10-22 NOTE — Progress Notes (Signed)
HPI Julie Hernandez is referred by Dr. Dorthea Cove for consideration for ICD insertion. The patient is a pleasant 52 yo woman with an ICM, EF 25% who is s/p MI. She has class 2 CHF symptoms under the direction of Dr. Dorthea Cove. She has not had syncope. She has a narrow QRS.  Allergies  Allergen Reactions   Esomeprazole Magnesium Anaphylaxis   Ciprofibrate Itching   Ciprofloxacin Itching   Gabapentin Other (See Comments)    Achy - myalgia   Lubiprostone Diarrhea   Statins     Myalgia - flu like symptoms   Amlodipine Rash   Iron Rash and Swelling   Latex Rash and Swelling   Povidone Iodine Rash     Current Outpatient Medications  Medication Sig Dispense Refill   albuterol (VENTOLIN HFA) 108 (90 Base) MCG/ACT inhaler Inhale 2 puffs into the lungs every 4 (four) hours as needed for wheezing or shortness of breath.     aspirin EC 81 MG tablet Take 1 tablet (81 mg total) by mouth daily. 30 tablet 11   blood glucose meter kit and supplies Dispense based on patient and insurance preference. Use up to four times daily as directed. (FOR ICD-10 E10.9, E11.9). 1 each 1   clopidogrel (PLAVIX) 75 MG tablet Take 1 tablet (75 mg total) by mouth daily. 30 tablet 11   Continuous Glucose Sensor (FREESTYLE LIBRE 3 PLUS SENSOR) MISC Use to monitor glucose continuously as directed. Change sensor every 15 days 6 each 3   empagliflozin (JARDIANCE) 10 MG TABS tablet Take 10 mg by mouth daily.     escitalopram (LEXAPRO) 10 MG tablet Take 10 mg by mouth daily.     Evolocumab (REPATHA SURECLICK) 140 MG/ML SOAJ Inject 140 mg into the skin every 14 (fourteen) days. 6 mL 3   ezetimibe (ZETIA) 10 MG tablet Take 1 tablet (10 mg total) by mouth daily at 6pm. 30 tablet 11   famotidine (PEPCID) 40 MG tablet Take 40 mg by mouth daily as needed for heartburn.     furosemide (LASIX) 20 MG tablet Take 3 tablets (60 mg total) by mouth as needed. For weight gain of 3lb in 24hrs, 5lb in a week or edema 270 tablet 3   Insulin Pen Needle  32G X 4 MM MISC Use with insulin pen 100 each 0   metFORMIN (GLUCOPHAGE) 500 MG tablet Take 1 tablet (500 mg total) by mouth 2 (two) times daily with a meal.     metoprolol succinate (TOPROL XL) 25 MG 24 hr tablet Take 1 tablet (25 mg total) by mouth daily. 30 tablet 1   nitroGLYCERIN (NITROSTAT) 0.4 MG SL tablet PLACE 1 TABLET UNDER THE TONGUE AND ALLOW TO DISSOLVE SLOWLY, DO NOT CHEW OR SWALLOW. YOU MAY USE ADDITIONAL TABLETS EVERY 5 MINUTES BUT NO MORE THAN 3 TABLETS. 25 tablet 1   ondansetron (ZOFRAN ODT) 4 MG disintegrating tablet Take 1 tablet (4 mg total) by mouth every 8 (eight) hours as needed for nausea or vomiting. 10 tablet 0   potassium chloride SA (KLOR-CON M) 20 MEQ tablet Take 1 tablet (20 mEq total) by mouth daily. 90 tablet 3   RABEprazole (ACIPHEX) 20 MG tablet Take 40 mg by mouth daily.     Semaglutide, 1 MG/DOSE, 4 MG/3ML SOPN Inject 1 mg as directed once a week. 9 mL 1   spironolactone (ALDACTONE) 25 MG tablet Take 1 tablet (25 mg total) by mouth daily. 30 tablet 11   No current facility-administered  medications for this visit.     Past Medical History:  Diagnosis Date   CHF (congestive heart failure) (HCC)    Coronary artery disease    COVID 12/03/2020   Diabetes mellitus without complication (HCC)    Hypertension    OSA on CPAP    moderate obstructive sleep apnea with an AHI of 20.6/h and he was placed on auto CPAP from 4 to 15 cm H2O    ROS:   All systems reviewed and negative except as noted in the HPI.   Past Surgical History:  Procedure Laterality Date   CORONARY BALLOON ANGIOPLASTY N/A 04/07/2020   Procedure: CORONARY BALLOON ANGIOPLASTY;  Surgeon: Tonny Bollman, MD;  Location: [facility] INVASIVE CV LAB;  Service: Cardiovascular;  Laterality: N/A;   CORONARY PRESSURE/FFR STUDY N/A 04/07/2020   Procedure: INTRAVASCULAR PRESSURE WIRE/FFR STUDY;  Surgeon: Tonny Bollman, MD;  Location: [facility] INVASIVE CV LAB;  Service: Cardiovascular;  Laterality: N/A;   CORONARY  STENT INTERVENTION N/A 04/07/2020   Procedure: CORONARY STENT INTERVENTION;  Surgeon: Tonny Bollman, MD;  Location: [facility] INVASIVE CV LAB;  Service: Cardiovascular;  Laterality: N/A;   CORONARY STENT INTERVENTION N/A 09/18/2022   Procedure: CORONARY STENT INTERVENTION;  Surgeon: Tonny Bollman, MD;  Location: [facility] INVASIVE CV LAB;  Service: Cardiovascular;  Laterality: N/A;   CORONARY ULTRASOUND/IVUS N/A 04/07/2020   Procedure: Intravascular Ultrasound/IVUS;  Surgeon: Tonny Bollman, MD;  Location: [facility] INVASIVE CV LAB;  Service: Cardiovascular;  Laterality: N/A;   LEFT HEART CATH AND CORONARY ANGIOGRAPHY N/A 04/07/2020   Procedure: LEFT HEART CATH AND CORONARY ANGIOGRAPHY;  Surgeon: Tonny Bollman, MD;  Location: [facility] INVASIVE CV LAB;  Service: Cardiovascular;  Laterality: N/A;   LEFT HEART CATH AND CORONARY ANGIOGRAPHY N/A 04/14/2021   Procedure: LEFT HEART CATH AND CORONARY ANGIOGRAPHY;  Surgeon: Dolores Patty, MD;  Location: [facility] INVASIVE CV LAB;  Service: Cardiovascular;  Laterality: N/A;   RIGHT/LEFT HEART CATH AND CORONARY ANGIOGRAPHY N/A 09/17/2022   Procedure: RIGHT/LEFT HEART CATH AND CORONARY ANGIOGRAPHY;  Surgeon: Yvonne Kendall, MD;  Location: [facility] INVASIVE CV LAB;  Service: Cardiovascular;  Laterality: N/A;     Family History  Problem Relation Age of Onset   Diabetes Mother    Hypertension Mother    Heart disease Mother    Hyperlipidemia Mother      Social History   Socioeconomic History   Marital status: Divorced    Spouse name: Not on file   Number of children: 2   Years of education: Not on file   Highest education level: Not on file  Occupational History   Occupation: On disability  Tobacco Use   Smoking status: Former    Current packs/day: 0.00    Types: Cigarettes    Quit date: 02/18/2020    Years since quitting: 3.6   Smokeless tobacco: Never  Vaping Use   Vaping status: Some Days  Substance and Sexual Activity   Alcohol use: Yes    Comment: SOCIAL   Drug  use: Never   Sexual activity: Not on file  Other Topics Concern   Not on file  Social History Narrative   Not on file   Social Determinants of Health   Financial Resource Strain: Not on file  Food Insecurity: Not on file  Transportation Needs: Not on file  Physical Activity: Not on file  Stress: Not on file  Social Connections: Not on file  Intimate Partner Violence: Not on file     BP 114/64   Pulse 89   Ht  5\' 2"  (1.575 m)   Wt 160 lb (72.6 kg)   SpO2 98%   BMI 29.26 kg/m   Physical Exam:  Well appearing woman, NAD Neck:  No JVD, no thyromegally Lymphatics:  No adenopathy Back:  No CVA tenderness Lungs:  Clear with no wheezes HEART:  Regular rate rhythm, no murmurs, no rubs, no clicks Abd:  soft, positive bowel sounds, no organomegally, no rebound, no guarding Ext:  2 plus pulses, no edema, no cyanosis, no clubbing Skin:  No rashes no nodules Neuro:  CN II through XII intact, motor grossly intact  EKG - reviewed. NSR   Assess/Plan: Chronic systolic heart failure/ICM - she is stable with class 2 symptoms and no angina. I offered her insertion of a DDD ICD and the risks/benefits/goals/expectations were reviewed and she wishes to proceed.   Sharlot Gowda Jerre Vandrunen,MD

## 2023-10-29 ENCOUNTER — Other Ambulatory Visit (HOSPITAL_COMMUNITY): Payer: Self-pay

## 2023-10-29 DIAGNOSIS — I5022 Chronic systolic (congestive) heart failure: Secondary | ICD-10-CM

## 2023-10-29 MED ORDER — METOPROLOL SUCCINATE ER 25 MG PO TB24: 25.0000 mg | ORAL_TABLET | Freq: Every day | ORAL | 1 refills | Status: DC

## 2023-10-30 ENCOUNTER — Telehealth: Payer: Self-pay | Admitting: Internal Medicine

## 2023-10-30 DIAGNOSIS — Z01812 Encounter for preprocedural laboratory examination: Secondary | ICD-10-CM

## 2023-10-30 NOTE — Telephone Encounter (Signed)
Patient called and states she would like to do Jan 6th for the ICD Implant with Dr. Ladona Ridgel.

## 2023-10-30 NOTE — Telephone Encounter (Signed)
Spoke with Pt to confirm day. Will release labs so she can get them when she is ready and told her I would call back to go over instructions.

## 2023-11-01 NOTE — Telephone Encounter (Signed)
Spoke with Pt earlier today and went over instructions. Letter and scrub placed at front.

## 2023-11-04 ENCOUNTER — Other Ambulatory Visit (HOSPITAL_COMMUNITY): Payer: Self-pay

## 2023-11-04 DIAGNOSIS — I5022 Chronic systolic (congestive) heart failure: Secondary | ICD-10-CM

## 2023-11-04 MED ORDER — ASPIRIN 81 MG PO TBEC: 81.0000 mg | DELAYED_RELEASE_TABLET | Freq: Every day | ORAL | 6 refills | Status: DC

## 2023-11-04 NOTE — Progress Notes (Addendum)
Advanced Heart Failure Clinic Note  Date:  01/02/2021   ID:  Julie Hernandez, DOB 02/04/1971, MRN [id number]  Location: Home  Provider location: [facility] [place] [facility] Type of Visit: Established patient  PCP:  Josph Macho, FNP  EP: Dr. Ladona Ridgel HF Cardiologist: Dr. Gala Romney  Chief Complaint: Heart Failure follow-up   HPI: Julie Hernandez is a 52 y.o. woman with COPD/ongoing tobacco abuse (vaping), premature CAD s/p anterior MI, diet-controlled DM2, chronic back pain, and systolic HF due to [facility] referred for further evaluation of CAD and systolic HF.  Had anterior MI in 2010. LAD stent at [facility]. Went to [facility] that same year and had  a second stent placed. Did well from a cardiac perspective until 3/21.   Cath 02/08/20 at [facility] for NSTEMI. 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 Was told she may need repeat stenting vs CABG. Referred here for second opinion.   We saw her in 5/21 with daily angina. Underwent MRI which showed EF 26% only minimal viability in anterior wall. Decision to proceed with PCI of LCX and LAD (ISR) over CABG. Had successful PCI of LCX and LAD with Dr. Excell Seltzer on 5/20.   Echo 9/21 EF 60-65%.  Echo 04/06/21 EF 50-55% mild anterior and apical HK  Admitted 10/23 with NSTEMI. Echo EF back down to 20% and RV mildly reduced. Underwent R/LHC which showed elevated right and left filling pressures and low CO;  occluded LAD stent and new CTO of RCA with L>>R collaterals. Underwent staged PCI with DES to mid LAD.   Seen in ED 12/25/22 with  SVT,  Echo 01/11/23 EF 25-30% with WMA c/w previous LAD infarct Sleep  01/08/23 Moderate OSA 20.6 CSA 1.6  Zio 2/24 50 runs NSVT (longest 11 beats) 8.5% PVCs  cMRI 8/24 showed LVEF 26%, RVEF 39%, large anterior infarct--> referred to EP for ICD.  Today she returns for HF follow  up. Overall feeling fair. Having more frequent CP, with typical and atypical features. Pain occurs during the day. She required nitroglycerin x 1 dose yesterday, with resolution of symptoms. More fatigued and SOB with ADLs, able to walk up steps. Palpitations worsening. Face, hands and abdomen are swelling. Denies dizziness, or PND/Orthopnea. Appetite ok. No fever or chills. Weight at home 158-160  pounds. Taking all medications. Still vaping, no drugs or regular ETOH. Saw Dr. Ladona Ridgel and planning on ICD next month. Having GI upset. Compliant with CPAP.  Cardiac Studies - cMRI (8/24): showed LVEF 26%, RVEF 39%, large anterior infarct  - R/LHC (10/23): RA 23 mean, PA 48/35 mean 39, PCWP mean 30, CO/CI (Fick) 3.7/2 Severe 2v CAD with 90% in stent re-stenonsis of mid LAD and CTO of mid RCA with L>>R collaterals  - Echo (10/23): EF 20%, RV mildly down.  - Echo (5/22): EF 50-55%, mid anterior and apical HK  - Echo (9/21): EF 60-65%  - Zio (7/21) 1. Sinus rhythm - avg HR of 82 2. 13 Ventricular Tachycardia runs occurred, the run with the fastest interval lasting 12 beats with a max rate of 139 bpm (avg 110 bpm); the run with the fastest interval was also the longest 3. Very frequents PVCs (32.2%, [id number]), VE Couplets were rare (<1.0%, 2118),  4. Ventricular Bigeminy and Trigeminy were present. 5.. Multiple patient-triggered events associated  - cMRI (03/29/20)  1. Subendocardial late gadolinium enhancement consistent with prior infarct in the LAD territory. There is >50% transmural LGE suggesting nonviability in the basal to apical anterior wall and apex. There is <50% transmural LGE suggesting viability in the basal to mid anteroseptum and apical septum  2.  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 Normal LV size with severe systolic dysfunction (EF 26%). Akinesis of basal to apical anterior/anteroseptal walls and apex   4.  Small RV size with normal systolic function (EF 62%)  Past  Medical History:  Diagnosis Date   CHF (congestive heart failure) (HCC)    Coronary artery disease    COVID 12/03/2020   Diabetes mellitus without complication (HCC)    Hypertension    OSA on CPAP    moderate obstructive sleep apnea with an AHI of 20.6/h and he was placed on auto CPAP from 4 to 15 cm H2O   Current Outpatient Medications  Medication Sig Dispense Refill   albuterol (VENTOLIN HFA) 108 (90 Base) MCG/ACT inhaler Inhale 2 puffs into the lungs every 4 (four) hours as needed for wheezing or shortness of breath.     aspirin EC 81 MG tablet Take 1 tablet (81 mg total) by mouth daily. 30 tablet 6   blood glucose meter kit and supplies Dispense based on patient and insurance preference. Use up to four times daily as directed. (FOR ICD-10 E10.9, E11.9). 1 each 1   clopidogrel (PLAVIX) 75 MG tablet Take 1 tablet (75 mg total) by mouth daily. 30 tablet 11   Continuous Glucose Sensor (FREESTYLE LIBRE 3 PLUS SENSOR) MISC Use to monitor glucose continuously as directed. Change sensor every 15 days 6 each 3   empagliflozin (JARDIANCE) 10 MG TABS tablet Take 10 mg by mouth daily.     escitalopram (LEXAPRO) 10 MG tablet Take 10 mg by mouth daily.     Evolocumab (REPATHA SURECLICK) 140 MG/ML SOAJ Inject 140 mg into the skin every 14 (fourteen) days. 6 mL 3   ezetimibe (ZETIA) 10 MG tablet Take 1 tablet (10 mg total) by mouth daily at 6pm. 30 tablet 11   famotidine (PEPCID) 40 MG tablet Take 40 mg by mouth daily as needed for heartburn.     furosemide (LASIX) 20 MG tablet Take 3 tablets (60 mg total) by mouth as needed. For weight gain of 3lb in 24hrs, 5lb in a week or edema 270 tablet 3   Insulin Pen Needle 32G X 4 MM MISC Use with insulin pen 100 each 0   metFORMIN (GLUCOPHAGE) 500 MG tablet Take 1 tablet (500 mg total) by mouth 2 (two) times daily with a meal.     metoprolol succinate (TOPROL XL) 25 MG 24 hr tablet Take 1 tablet (25 mg total) by mouth daily. 30 tablet 1   nitroGLYCERIN  (NITROSTAT) 0.4 MG SL tablet PLACE 1 TABLET UNDER THE TONGUE AND ALLOW TO DISSOLVE SLOWLY, DO NOT CHEW OR SWALLOW. YOU MAY USE ADDITIONAL TABLETS EVERY 5 MINUTES BUT NO MORE THAN 3 TABLETS. 25 tablet 1   ondansetron (ZOFRAN ODT) 4 MG disintegrating tablet Take 1 tablet (4 mg total) by mouth every 8 (eight) hours as needed for nausea or vomiting. 10 tablet 0   potassium chloride SA (KLOR-CON M) 20 MEQ tablet Take 1 tablet (20 mEq total) by mouth daily. 90 tablet 3   RABEprazole (ACIPHEX) 20 MG tablet Take 40 mg by mouth daily.     Semaglutide, 1 MG/DOSE, 4 MG/3ML SOPN Inject 1 mg  as directed once a week. 9 mL 1   spironolactone (ALDACTONE) 25 MG tablet Take 1 tablet (25 mg total) by mouth daily. 30 tablet 11   No current facility-administered medications for this encounter.   Allergies  Allergen Reactions   Esomeprazole Magnesium Anaphylaxis   Ciprofibrate Itching   Ciprofloxacin Itching   Gabapentin Other (See Comments)    Achy - myalgia   Lubiprostone Diarrhea   Statins     Myalgia - flu like symptoms   Amlodipine Rash   Iron Rash and Swelling   Latex Rash and Swelling   Povidone Iodine Rash   Social History   Socioeconomic History   Marital status: Divorced    Spouse name: Not on file   Number of children: 2   Years of education: Not on file   Highest education level: Not on file  Occupational History   Occupation: On disability  Tobacco Use   Smoking status: Former    Current packs/day: 0.00    Types: Cigarettes    Quit date: 02/18/2020    Years since quitting: 3.7   Smokeless tobacco: Never  Vaping Use   Vaping status: Some Days  Substance and Sexual Activity   Alcohol use: Yes    Comment: SOCIAL   Drug use: Never   Sexual activity: Not on file  Other Topics Concern   Not on file  Social History Narrative   Not on file   Social Drivers of Health   Financial Resource Strain: Not on file  Food Insecurity: Not on file  Transportation Needs: Not on file   Physical Activity: Not on file  Stress: Not on file  Social Connections: Not on file  Intimate Partner Violence: Not on file   FHx: M died at 51 from MI and HF D had stroke No brother and sisters  BP 100/64   Pulse 92   Wt 74.5 kg (164 lb 3.2 oz)   SpO2 98%   BMI 30.03 kg/m   Wt Readings from Last 3 Encounters:  11/05/23 74.5 kg (164 lb 3.2 oz)  10/22/23 72.6 kg (160 lb)  09/30/23 70.2 kg (154 lb 12.8 oz)   PHYSICAL EXAM General:  NAD. No resp difficulty, walked into clinic HEENT: Normal Neck: Supple. No JVD. Carotids 2+ bilat; no bruits. No lymphadenopathy or thryomegaly appreciated. Cor: PMI nondisplaced. irregular rate & rhythm. No rubs, gallops or murmurs. Lungs: Clear Abdomen: Soft, obese, nontender, nondistended. No hepatosplenomegaly. No bruits or masses. Good bowel sounds. Extremities: No cyanosis, clubbing, rash, edema Neuro: Alert & oriented x 3, cranial nerves grossly intact. Moves all 4 extremities w/o difficulty. Mildly anxious  ECG (personally reviewed): SR with frequent PVCs  ReDS: 34%  ASSESSMENT & PLAN: 1. CAD - prior anterior MI w/ h/o PCI to mLAD and mLCX - Echo w/ drop in EF, from 55% >> 20%, RV mildly reduced - Cath (10/23): 90% in-stent restenosis of mLAD and CTO mRCA with left-to-right collaterals. LCx stent widely patent. -> S/P PCI DES mid LAD. - Having recurrent CP with typical and atypical features, appears consistent with similar pattern. Low threshold to repeat LHC with Dr. Excell Seltzer if symptoms escalate. Discussed this today.  Discussed risks/benefits of cath and she is agreeable, should symptoms worsen - Continue DAPT. - Lipids managed by Lipid Clinic - Informed Consent   Shared Decision Making/Informed Consent The risks [stroke (1 in 1000), death (1 in 1000), kidney failure [usually temporary] (1 in 500), bleeding (1 in 200), allergic reaction [possibly serious] (1  in 200)], benefits (diagnostic support and management of coronary artery  disease) and alternatives of a cardiac catheterization were discussed in detail with Ms. Bacik and she is willing to proceed.       2. Chronic Systolic Heart Failure - Previous history of ICM following prior anterior MI.  - EF previously 25-30%, improved post LAD and LCx PCI - Echo (9/21): EF 60-65% - Echo (04/06/21): EF 50-55% mild anterior and apical HK - Echo (10/23): EF down 20%, RV mildly reduced in setting of LAD and RCA infarcts, previously placed mLAD stent w/ 99% stenosis, CTO mRCA w/ L>R collaterals  - RHC (10/23):  w/ elevated R+ L filling pressures and low output, RA 23, PCWP 30, CI 2.0  - Echo 01/11/23 EF 25-30% with WMA c/w previous LAD infarct - cMRI (8/24): LVEF 26%, RVEF 39%, large anterior infarct - NYHA II-IIb, volume ok, REDs 34% - Continue Lasix 60 mg daily, OK to take extra 40 mg PRN. - Continue Toprol XL 25 mg daily. - Off Entresto 24/26 mg bid due to low BP. BP too soft to add low-dose losartan today. - Continue spironolactone 25 mg daily.   - Continue Jardiance/Ozempic - EF remains < 35%. She has seen Dr Ladona Ridgel and planning on ICD implantation 11/2023. - Labs today.  3. SVT/PVCs - Zio (05/2020) showed mostly SR, frequent PVCs (32% burden), 13 runs of VT - Zio 2/24 50 runs NSVT (longest 11 beats) 8.5% PVCs - EF previously improved w/o PVC suppression, so unlikely contributing to CM - ECG today with frequent PVCs, she is symptomatic. - Place 1 week Zio to quantify PVC burden. - Would like to avoid amio with young age and vaping, consider mexiletine, pending Zio results. - Continue CPAP  - Check lytes, mag and TSH today  4. DM2 - improving control - Hgb A1c 13.1 (11/23)--> 8.5 (1/24) - Followed by PCP/ENDO - Continue SGLT2i and Ozempic   5. HL w/ LDL Goal < 55 - Statin intolerant. - Continue Repatha + Zetia - Followed by Lipid Clinic   6. H/o Tobacco Use - Quit cigarettes, currently vapes  - Discussed need for cessation  7. Obesity - Body mass index  is 30.03 kg/m. - Now on Ozempic  8. OSA - Sleep study 01/08/23 Moderate OSA 20.6 CSA 1.6 - Continue CPAP  - Follows with Dr. Mayford Knife  9. Carotid bruit - Carotid u/s with no significant dz  Keep follow up in 2 months with Dr. Gala Romney, as scheduled.  Jacklynn Ganong, FNP  8:44 AM  Patient seen and examined with the above-signed Advanced Practice Provider and/or Housestaff. I personally reviewed laboratory data, imaging studies and relevant notes. I independently examined the patient and formulated the important aspects of the plan. I have edited the note to reflect any of my changes or salient points. I have personally discussed the plan with the patient and/or family.  She having episodes of CP and palpitations which are increased from previous. ECG today shows frequent PVCs.   No edema, orthopnea or PND.  Compliant with CPAP. Still vaping.  General:  Well appearing. No resp difficulty HEENT: normal Neck: supple. no JVD. Carotids 2+ bilat; no bruits. No lymphadenopathy or thryomegaly appreciated. Cor: PMI nondisplaced. Regular rate & rhythm + ectopy No rubs, gallops or murmurs. Lungs: clear Abdomen: soft, nontender, nondistended. No hepatosplenomegaly. No bruits or masses. Good bowel sounds. Extremities: no cyanosis, clubbing, rash, edema Neuro: alert & orientedx3, cranial nerves grossly intact. moves all 4 extremities w/o difficulty.  Affect pleasant  Suspect symptoms mostly related to her PVCs but cannot exclude progressive angina.   Will place Zio to quantify PVC burden. Will likely need mexilitene.   If CP progresses will need cath. She wil call us with any changes.   Arvilla Meres, MD  2:16 PM

## 2023-11-04 NOTE — H&P (View-Only) (Signed)
 [facility] Note  Date:  01/02/2021   ID:  Julie Hernandez, DOB 02/04/1971, MRN [id number]  Location: Home  Provider location: [facility] Type of Visit: Established patient  PCP:  Josph Macho, FNP  EP: Dr. Ladona Ridgel HF Cardiologist: Dr. Gala Romney  Chief Complaint: Heart Failure follow-up   HPI: Julie Hernandez is a 52 y.o. woman with COPD/ongoing tobacco abuse (vaping), premature CAD s/p anterior MI, diet-controlled DM2, chronic back pain, and systolic HF due to [facility] referred for further evaluation of CAD and systolic HF.  Had anterior MI in 2010. LAD stent at [facility]. Went to [facility] that same year and had  a second stent placed. Did well from a cardiac perspective until 3/21.   Cath 02/08/20 at [facility] for NSTEMI. 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 Was told she may need repeat stenting vs CABG. Referred here for second opinion.   We saw her in 5/21 with daily angina. Underwent MRI which showed EF 26% only minimal viability in anterior wall. Decision to proceed with PCI of LCX and LAD (ISR) over CABG. Had successful PCI of LCX and LAD with Dr. Excell Seltzer on 5/20.   Echo 9/21 EF 60-65%.  Echo 04/06/21 EF 50-55% mild anterior and apical HK  Admitted 10/23 with NSTEMI. Echo EF back down to 20% and RV mildly reduced. Underwent R/LHC which showed elevated right and left filling pressures and low CO;  occluded LAD stent and new CTO of RCA with L>>R collaterals. Underwent staged PCI with DES to mid LAD.   Seen in ED 12/25/22 with  SVT,  Echo 01/11/23 EF 25-30% with WMA c/w previous LAD infarct Sleep  01/08/23 Moderate OSA 20.6 CSA 1.6  Zio 2/24 50 runs NSVT (longest 11 beats) 8.5% PVCs  cMRI 8/24 showed LVEF 26%, RVEF 39%, large anterior infarct--> referred to EP for ICD.  Today she returns for HF follow  up. Overall feeling fair. Having more frequent CP, with typical and atypical features. Pain occurs during the day. She required nitroglycerin x 1 dose yesterday, with resolution of symptoms. More fatigued and SOB with ADLs, able to walk up steps. Palpitations worsening. Face, hands and abdomen are swelling. Denies dizziness, or PND/Orthopnea. Appetite ok. No fever or chills. Weight at home 158-160  pounds. Taking all medications. Still vaping, no drugs or regular ETOH. Saw Dr. Ladona Ridgel and planning on ICD next month. Having GI upset. Compliant with CPAP.  Cardiac Studies - cMRI (8/24): showed LVEF 26%, RVEF 39%, large anterior infarct  - R/LHC (10/23): RA 23 mean, PA 48/35 mean 39, PCWP mean 30, CO/CI (Fick) 3.7/2 Severe 2v CAD with 90% in stent re-stenonsis of mid LAD and CTO of mid RCA with L>>R collaterals  - Echo (10/23): EF 20%, RV mildly down.  - Echo (5/22): EF 50-55%, mid anterior and apical HK  - Echo (9/21): EF 60-65%  - Zio (7/21) 1. Sinus rhythm - avg HR of 82 2. 13 Ventricular Tachycardia runs occurred, the run with the fastest interval lasting 12 beats with a max rate of 139 bpm (avg 110 bpm); the run with the fastest interval was also the longest 3. Very frequents PVCs (32.2%, [id number]), VE Couplets were rare (<1.0%, 2118),  4. Ventricular Bigeminy and Trigeminy were present. 5.. Multiple patient-triggered events associated  - cMRI (03/29/20)  1. Subendocardial late gadolinium enhancement consistent with prior infarct in the LAD territory. There is >50% transmural LGE suggesting nonviability in the basal to apical anterior wall and apex. There is <50% transmural LGE suggesting viability in the basal to mid anteroseptum and apical septum  2.  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 Normal LV size with severe systolic dysfunction (EF 26%). Akinesis of basal to apical anterior/anteroseptal walls and apex   4.  Small RV size with normal systolic function (EF 62%)  Past  Medical History:  Diagnosis Date   CHF (congestive heart failure) (HCC)    Coronary artery disease    COVID 12/03/2020   Diabetes mellitus without complication (HCC)    Hypertension    OSA on CPAP    moderate obstructive sleep apnea with an AHI of 20.6/h and he was placed on auto CPAP from 4 to 15 cm H2O   Current Outpatient Medications  Medication Sig Dispense Refill   albuterol (VENTOLIN HFA) 108 (90 Base) MCG/ACT inhaler Inhale 2 puffs into the lungs every 4 (four) hours as needed for wheezing or shortness of breath.     aspirin EC 81 MG tablet Take 1 tablet (81 mg total) by mouth daily. 30 tablet 6   blood glucose meter kit and supplies Dispense based on patient and insurance preference. Use up to four times daily as directed. (FOR ICD-10 E10.9, E11.9). 1 each 1   clopidogrel (PLAVIX) 75 MG tablet Take 1 tablet (75 mg total) by mouth daily. 30 tablet 11   Continuous Glucose Sensor (FREESTYLE LIBRE 3 PLUS SENSOR) MISC Use to monitor glucose continuously as directed. Change sensor every 15 days 6 each 3   empagliflozin (JARDIANCE) 10 MG TABS tablet Take 10 mg by mouth daily.     escitalopram (LEXAPRO) 10 MG tablet Take 10 mg by mouth daily.     Evolocumab (REPATHA SURECLICK) 140 MG/ML SOAJ Inject 140 mg into the skin every 14 (fourteen) days. 6 mL 3   ezetimibe (ZETIA) 10 MG tablet Take 1 tablet (10 mg total) by mouth daily at 6pm. 30 tablet 11   famotidine (PEPCID) 40 MG tablet Take 40 mg by mouth daily as needed for heartburn.     furosemide (LASIX) 20 MG tablet Take 3 tablets (60 mg total) by mouth as needed. For weight gain of 3lb in 24hrs, 5lb in a week or edema 270 tablet 3   Insulin Pen Needle 32G X 4 MM MISC Use with insulin pen 100 each 0   metFORMIN (GLUCOPHAGE) 500 MG tablet Take 1 tablet (500 mg total) by mouth 2 (two) times daily with a meal.     metoprolol succinate (TOPROL XL) 25 MG 24 hr tablet Take 1 tablet (25 mg total) by mouth daily. 30 tablet 1   nitroGLYCERIN  (NITROSTAT) 0.4 MG SL tablet PLACE 1 TABLET UNDER THE TONGUE AND ALLOW TO DISSOLVE SLOWLY, DO NOT CHEW OR SWALLOW. YOU MAY USE ADDITIONAL TABLETS EVERY 5 MINUTES BUT NO MORE THAN 3 TABLETS. 25 tablet 1   ondansetron (ZOFRAN ODT) 4 MG disintegrating tablet Take 1 tablet (4 mg total) by mouth every 8 (eight) hours as needed for nausea or vomiting. 10 tablet 0   potassium chloride SA (KLOR-CON M) 20 MEQ tablet Take 1 tablet (20 mEq total) by mouth daily. 90 tablet 3   RABEprazole (ACIPHEX) 20 MG tablet Take 40 mg by mouth daily.     Semaglutide, 1 MG/DOSE, 4 MG/3ML SOPN Inject 1 mg  as directed once a week. 9 mL 1   spironolactone (ALDACTONE) 25 MG tablet Take 1 tablet (25 mg total) by mouth daily. 30 tablet 11   No current facility-administered medications for this encounter.   Allergies  Allergen Reactions   Esomeprazole Magnesium Anaphylaxis   Ciprofibrate Itching   Ciprofloxacin Itching   Gabapentin Other (See Comments)    Achy - myalgia   Lubiprostone Diarrhea   Statins     Myalgia - flu like symptoms   Amlodipine Rash   Iron Rash and Swelling   Latex Rash and Swelling   Povidone Iodine Rash   Social History   Socioeconomic History   Marital status: Divorced    Spouse name: Not on file   Number of children: 2   Years of education: Not on file   Highest education level: Not on file  Occupational History   Occupation: On disability  Tobacco Use   Smoking status: Former    Current packs/day: 0.00    Types: Cigarettes    Quit date: 02/18/2020    Years since quitting: 3.7   Smokeless tobacco: Never  Vaping Use   Vaping status: Some Days  Substance and Sexual Activity   Alcohol use: Yes    Comment: SOCIAL   Drug use: Never   Sexual activity: Not on file  Other Topics Concern   Not on file  Social History Narrative   Not on file   Social Drivers of Health   Financial Resource Strain: Not on file  Food Insecurity: Not on file  Transportation Needs: Not on file   Physical Activity: Not on file  Stress: Not on file  Social Connections: Not on file  Intimate Partner Violence: Not on file   FHx: M died at 51 from MI and HF D had stroke No brother and sisters  BP 100/64   Pulse 92   Wt 74.5 kg (164 lb 3.2 oz)   SpO2 98%   BMI 30.03 kg/m   Wt Readings from Last 3 Encounters:  11/05/23 74.5 kg (164 lb 3.2 oz)  10/22/23 72.6 kg (160 lb)  09/30/23 70.2 kg (154 lb 12.8 oz)   PHYSICAL EXAM General:  NAD. No resp difficulty, walked into clinic HEENT: Normal Neck: Supple. No JVD. Carotids 2+ bilat; no bruits. No lymphadenopathy or thryomegaly appreciated. Cor: PMI nondisplaced. irregular rate & rhythm. No rubs, gallops or murmurs. Lungs: Clear Abdomen: Soft, obese, nontender, nondistended. No hepatosplenomegaly. No bruits or masses. Good bowel sounds. Extremities: No cyanosis, clubbing, rash, edema Neuro: Alert & oriented x 3, cranial nerves grossly intact. Moves all 4 extremities w/o difficulty. Mildly anxious  ECG (personally reviewed): SR with frequent PVCs  ReDS: 34%  ASSESSMENT & PLAN: 1. CAD - prior anterior MI w/ h/o PCI to mLAD and mLCX - Echo w/ drop in EF, from 55% >> 20%, RV mildly reduced - Cath (10/23): 90% in-stent restenosis of mLAD and CTO mRCA with left-to-right collaterals. LCx stent widely patent. -> S/P PCI DES mid LAD. - Having recurrent CP with typical and atypical features, appears consistent with similar pattern. Low threshold to repeat LHC with Dr. Excell Seltzer if symptoms escalate. Discussed this today.  Discussed risks/benefits of cath and she is agreeable, should symptoms worsen - Continue DAPT. - Lipids managed by Lipid Clinic - Informed Consent   Shared Decision Making/Informed Consent The risks [stroke (1 in 1000), death (1 in 1000), kidney failure [usually temporary] (1 in 500), bleeding (1 in 200), allergic reaction [possibly serious] (1  in 200)], benefits (diagnostic support and management of coronary artery  disease) and alternatives of a cardiac catheterization were discussed in detail with Julie Hernandez and she is willing to proceed.       2. Chronic Systolic Heart Failure - Previous history of ICM following prior anterior MI.  - EF previously 25-30%, improved post LAD and LCx PCI - Echo (9/21): EF 60-65% - Echo (04/06/21): EF 50-55% mild anterior and apical HK - Echo (10/23): EF down 20%, RV mildly reduced in setting of LAD and RCA infarcts, previously placed mLAD stent w/ 99% stenosis, CTO mRCA w/ L>R collaterals  - RHC (10/23):  w/ elevated R+ L filling pressures and low output, RA 23, PCWP 30, CI 2.0  - Echo 01/11/23 EF 25-30% with WMA c/w previous LAD infarct - cMRI (8/24): LVEF 26%, RVEF 39%, large anterior infarct - NYHA II-IIb, volume ok, REDs 34% - Continue Lasix 60 mg daily, OK to take extra 40 mg PRN. - Continue Toprol XL 25 mg daily. - Off Entresto 24/26 mg bid due to low BP. BP too soft to add low-dose losartan today. - Continue spironolactone 25 mg daily.   - Continue Jardiance/Ozempic - EF remains < 35%. She has seen Dr Ladona Ridgel and planning on ICD implantation 11/2023. - Labs today.  3. SVT/PVCs - Zio (05/2020) showed mostly SR, frequent PVCs (32% burden), 13 runs of VT - Zio 2/24 50 runs NSVT (longest 11 beats) 8.5% PVCs - EF previously improved w/o PVC suppression, so unlikely contributing to CM - ECG today with frequent PVCs, she is symptomatic. - Place 1 week Zio to quantify PVC burden. - Would like to avoid amio with young age and vaping, consider mexiletine, pending Zio results. - Continue CPAP  - Check lytes, mag and TSH today  4. DM2 - improving control - Hgb A1c 13.1 (11/23)--> 8.5 (1/24) - Followed by PCP/ENDO - Continue SGLT2i and Ozempic   5. HL w/ LDL Goal < 55 - Statin intolerant. - Continue Repatha + Zetia - Followed by Lipid Clinic   6. H/o Tobacco Use - Quit cigarettes, currently vapes  - Discussed need for cessation  7. Obesity - Body mass index  is 30.03 kg/m. - Now on Ozempic  8. OSA - Sleep study 01/08/23 Moderate OSA 20.6 CSA 1.6 - Continue CPAP  - Follows with Dr. Mayford Knife  9. Carotid bruit - Carotid u/s with no significant dz  Keep follow up in 2 months with Dr. Gala Romney, as scheduled.  Jacklynn Ganong, FNP  8:44 AM  Patient seen and examined with the above-signed Advanced Practice Provider and/or Housestaff. I personally reviewed laboratory data, imaging studies and relevant notes. I independently examined the patient and formulated the important aspects of the plan. I have edited the note to reflect any of my changes or salient points. I have personally discussed the plan with the patient and/or family.  She having episodes of CP and palpitations which are increased from previous. ECG today shows frequent PVCs.   No edema, orthopnea or PND.  Compliant with CPAP. Still vaping.  General:  Well appearing. No resp difficulty HEENT: normal Neck: supple. no JVD. Carotids 2+ bilat; no bruits. No lymphadenopathy or thryomegaly appreciated. Cor: PMI nondisplaced. Regular rate & rhythm + ectopy No rubs, gallops or murmurs. Lungs: clear Abdomen: soft, nontender, nondistended. No hepatosplenomegaly. No bruits or masses. Good bowel sounds. Extremities: no cyanosis, clubbing, rash, edema Neuro: alert & orientedx3, cranial nerves grossly intact. moves all 4 extremities w/o difficulty.  Affect pleasant  Suspect symptoms mostly related to her PVCs but cannot exclude progressive angina.   Will place Zio to quantify PVC burden. Will likely need mexilitene.   If CP progresses will need cath. She wil call us with any changes.   Arvilla Meres, MD  2:16 PM

## 2023-11-05 ENCOUNTER — Encounter (HOSPITAL_COMMUNITY): Payer: Self-pay | Admitting: Internal Medicine

## 2023-11-05 ENCOUNTER — Ambulatory Visit (HOSPITAL_COMMUNITY)
Admission: RE | Admit: 2023-11-05 | Discharge: 2023-11-05 | Disposition: A | Discharge status: Home / Self Care | Payer: PPO | Source: Ambulatory Visit | Attending: Family Medicine | Admitting: Family Medicine

## 2023-11-05 ENCOUNTER — Inpatient Hospital Stay (HOSPITAL_COMMUNITY)
Admission: RE | Admit: 2023-11-05 | Discharge: 2023-11-05 | Disposition: A | Discharge status: Home / Self Care | Payer: PPO | Source: Ambulatory Visit | Attending: Family Medicine | Admitting: Family Medicine

## 2023-11-05 ENCOUNTER — Other Ambulatory Visit (HOSPITAL_COMMUNITY): Payer: Self-pay | Admitting: Family Medicine

## 2023-11-05 VITALS — BP 100/64 | HR 92 | Wt 164.2 lb

## 2023-11-05 DIAGNOSIS — I493 Ventricular premature depolarization: Secondary | ICD-10-CM

## 2023-11-05 DIAGNOSIS — Z87891 Personal history of nicotine dependence: Secondary | ICD-10-CM | POA: Diagnosis not present

## 2023-11-05 DIAGNOSIS — I2510 Atherosclerotic heart disease of native coronary artery without angina pectoris: Secondary | ICD-10-CM

## 2023-11-05 DIAGNOSIS — I5022 Chronic systolic (congestive) heart failure: Secondary | ICD-10-CM | POA: Diagnosis not present

## 2023-11-05 LAB — BASIC METABOLIC PANEL
Anion gap: 11 (ref 5–15)
BUN: 12 mg/dL (ref 6–20)
CO2: 24 mmol/L (ref 22–32)
Calcium: 9.2 mg/dL (ref 8.9–10.3)
Chloride: 106 mmol/L (ref 98–111)
Creatinine, Ser: 0.87 mg/dL (ref 0.44–1.00)
GFR, Estimated: >60 mL/min (ref >60)
Glucose, Bld: 142 mg/dL — ABNORMAL HIGH (ref 70–99)
Potassium: 3.9 mmol/L (ref 3.5–5.1)
Sodium: 141 mmol/L (ref 135–145)

## 2023-11-05 LAB — CBC
HCT: 40.5 % (ref 36.0–46.0)
Hemoglobin: 12.1 g/dL (ref 12.0–15.0)
MCH: 25.4 pg — ABNORMAL LOW (ref 26.0–34.0)
MCHC: 29.9 g/dL — ABNORMAL LOW (ref 30.0–36.0)
MCV: 84.9 fL (ref 80.0–100.0)
Platelets: 355 10*3/uL (ref 150–400)
RBC: 4.77 MIL/uL (ref 3.87–5.11)
RDW: 15.8 % — ABNORMAL HIGH (ref 11.5–15.5)
WBC: 7.8 10*3/uL (ref 4.0–10.5)
nRBC: 0 % (ref 0.0–0.2)

## 2023-11-05 LAB — MAGNESIUM: Magnesium: 2.2 mg/dL (ref 1.7–2.4)

## 2023-11-05 LAB — BRAIN NATRIURETIC PEPTIDE: B Natriuretic Peptide: 506.1 pg/mL — ABNORMAL HIGH (ref 0.0–100.0)

## 2023-11-05 LAB — TSH: TSH: 1.461 u[IU]/mL (ref 0.350–4.500)

## 2023-11-05 NOTE — Patient Instructions (Signed)
Routine lab work today. Will notify you of abnormal results  Your provider has recommended that  you wear a Zio Patch for 7 days.  This monitor will record your heart rhythm for our review.  IF you have any symptoms while wearing the monitor please press the button.  If you have any issues with the patch or you notice a red or orange light on it please call the company at [phone].  Once you remove the patch please mail it back to the company as soon as possible so we can get the results.  Please keep your appointment with Dr.Bensimhon  Do the following things EVERYDAY: Weigh yourself in the morning before breakfast. Write it down and keep it in a log. Take your medicines as prescribed Eat low salt foods--Limit salt (sodium) to 2000 mg per day.  Stay as active as you can everyday Limit all fluids for the day to less than 2 liters

## 2023-11-05 NOTE — Progress Notes (Signed)
ReDS Vest / Clip - 11/05/23 0900       ReDS Vest / Clip   Station Marker A    Ruler Value 34.5    ReDS Value Range Low volume    ReDS Actual Value 34

## 2023-11-06 ENCOUNTER — Telehealth (HOSPITAL_COMMUNITY): Payer: Self-pay

## 2023-11-06 DIAGNOSIS — I5022 Chronic systolic (congestive) heart failure: Secondary | ICD-10-CM

## 2023-11-06 MED ORDER — POTASSIUM CHLORIDE CRYS ER 20 MEQ PO TBCR: 40.0000 meq | EXTENDED_RELEASE_TABLET | Freq: Every day | ORAL | 6 refills | Status: DC

## 2023-11-06 MED ORDER — FUROSEMIDE 20 MG PO TABS: 40.0000 mg | ORAL_TABLET | Freq: Every day | ORAL | 6 refills | Status: DC

## 2023-11-06 NOTE — Telephone Encounter (Signed)
-----   Message from [place] sent at 11/05/2023  3:12 PM EST ----- BNP elevated, suggesting volume up.  Increase lasix to 60 mg bid x 3 days, then 80 mg daily. Increase KCL to 40 daily.  Repeat BMET in 10-14 days

## 2023-11-06 NOTE — Telephone Encounter (Signed)
Patient's lasix and kcl medications has been increased and sent to her pharmacy and updated in her chart. In addition, lab orders placed and appointment scheduled. Pt aware, agreeable, and verbalized understanding.

## 2023-11-07 NOTE — Telephone Encounter (Signed)
Per Dr Gala Romney Coronaries are needed with Dr Excell Seltzer  Pt aware  Message to Dr Excell Seltzer nurse that will facilitate procedure  Maralyn Sago [facility])

## 2023-11-11 ENCOUNTER — Telehealth: Payer: Self-pay | Admitting: *Deleted

## 2023-11-11 NOTE — Addendum Note (Signed)
Encounter addended by: Jacklynn Ganong, FNP on: 11/11/2023 2:18 PM  Actions taken: Clinical Note Signed

## 2023-11-11 NOTE — Telephone Encounter (Signed)
Cardiac Catheterization scheduled at [facility] for: Monday November 18, 2023 11 AM Arrival time [facility] Main Entrance A at: 9 AM  Nothing to eat after midnight prior to procedure, clear liquids until 5 AM day of procedure.  Medication instructions: -Hold:  Metformin-day of procedure and 48 hours post procedure  Jardiance-AM of procedure  Semiglutide-weekly on Mondays-will not take 11/18/23  Lasix/Spironolactone/KCl-AM of procedure -Other usual morning medications can be taken with sips of water including aspirin 81 mg and Plavix 75 mg  Patient reports that she no longer uses daily/nightly Insulin.  Plan to go home the same day, you will only stay overnight if medically necessary.  You must have responsible adult to drive you home.  Someone must be with you the first 24 hours after you arrive home.

## 2023-11-12 LAB — CBC
Hematocrit: 45.8 % (ref 34.0–46.6)
Hemoglobin: 13.8 g/dL (ref 11.1–15.9)
MCH: 25.2 pg — ABNORMAL LOW (ref 26.6–33.0)
MCHC: 30.1 g/dL — ABNORMAL LOW (ref 31.5–35.7)
MCV: 84 fL (ref 79–97)
Platelets: 411 10*3/uL (ref 150–450)
RBC: 5.47 x10E6/uL — ABNORMAL HIGH (ref 3.77–5.28)
RDW: 14.8 % (ref 11.7–15.4)
WBC: 8.5 10*3/uL (ref 3.4–10.8)

## 2023-11-12 LAB — BASIC METABOLIC PANEL
BUN/Creatinine Ratio: 24 — ABNORMAL HIGH (ref 9–23)
BUN: 24 mg/dL (ref 6–24)
CO2: 25 mmol/L (ref 20–29)
Calcium: 10.3 mg/dL — ABNORMAL HIGH (ref 8.7–10.2)
Chloride: 96 mmol/L (ref 96–106)
Creatinine, Ser: 0.99 mg/dL (ref 0.57–1.00)
Glucose: 102 mg/dL — ABNORMAL HIGH (ref 70–99)
Potassium: 4.4 mmol/L (ref 3.5–5.2)
Sodium: 142 mmol/L (ref 134–144)
eGFR: 68 mL/min/{1.73_m2} (ref >59)

## 2023-11-14 ENCOUNTER — Other Ambulatory Visit (HOSPITAL_COMMUNITY): Payer: Self-pay | Admitting: Cardiology

## 2023-11-14 MED ORDER — CLOPIDOGREL BISULFATE 75 MG PO TABS: 75.0000 mg | ORAL_TABLET | Freq: Every day | ORAL | 11 refills | Status: DC

## 2023-11-18 ENCOUNTER — Other Ambulatory Visit: Payer: Self-pay

## 2023-11-18 ENCOUNTER — Encounter (HOSPITAL_COMMUNITY)
Admission: RE | Disposition: A | Discharge status: Home / Self Care | Payer: Self-pay | Source: Ambulatory Visit | Attending: Cardiovascular Disease

## 2023-11-18 ENCOUNTER — Ambulatory Visit (HOSPITAL_COMMUNITY)
Admission: RE | Admit: 2023-11-18 | Discharge: 2023-11-18 | Disposition: A | Discharge status: Home / Self Care | Payer: PPO | Source: Ambulatory Visit | Attending: Cardiovascular Disease | Admitting: Cardiovascular Disease

## 2023-11-18 DIAGNOSIS — I25119 Atherosclerotic heart disease of native coronary artery with unspecified angina pectoris: Secondary | ICD-10-CM | POA: Insufficient documentation

## 2023-11-18 DIAGNOSIS — E669 Obesity, unspecified: Secondary | ICD-10-CM | POA: Insufficient documentation

## 2023-11-18 DIAGNOSIS — I255 Ischemic cardiomyopathy: Secondary | ICD-10-CM | POA: Diagnosis not present

## 2023-11-18 DIAGNOSIS — Z79899 Other long term (current) drug therapy: Secondary | ICD-10-CM | POA: Insufficient documentation

## 2023-11-18 DIAGNOSIS — E785 Hyperlipidemia, unspecified: Secondary | ICD-10-CM | POA: Insufficient documentation

## 2023-11-18 DIAGNOSIS — F17290 Nicotine dependence, other tobacco product, uncomplicated: Secondary | ICD-10-CM | POA: Insufficient documentation

## 2023-11-18 DIAGNOSIS — E119 Type 2 diabetes mellitus without complications: Secondary | ICD-10-CM | POA: Diagnosis not present

## 2023-11-18 DIAGNOSIS — Y832 Surgical operation with anastomosis, bypass or graft as the cause of abnormal reaction of the patient, or of later complication, without mention of misadventure at the time of the procedure: Secondary | ICD-10-CM | POA: Insufficient documentation

## 2023-11-18 DIAGNOSIS — J449 Chronic obstructive pulmonary disease, unspecified: Secondary | ICD-10-CM | POA: Insufficient documentation

## 2023-11-18 DIAGNOSIS — Z7984 Long term (current) use of oral hypoglycemic drugs: Secondary | ICD-10-CM | POA: Diagnosis not present

## 2023-11-18 DIAGNOSIS — Z6830 Body mass index (BMI) 30.0-30.9, adult: Secondary | ICD-10-CM | POA: Diagnosis not present

## 2023-11-18 DIAGNOSIS — I2582 Chronic total occlusion of coronary artery: Secondary | ICD-10-CM | POA: Insufficient documentation

## 2023-11-18 DIAGNOSIS — G4733 Obstructive sleep apnea (adult) (pediatric): Secondary | ICD-10-CM | POA: Diagnosis not present

## 2023-11-18 DIAGNOSIS — Z7985 Long-term (current) use of injectable non-insulin antidiabetic drugs: Secondary | ICD-10-CM | POA: Insufficient documentation

## 2023-11-18 DIAGNOSIS — G8929 Other chronic pain: Secondary | ICD-10-CM | POA: Diagnosis not present

## 2023-11-18 DIAGNOSIS — I252 Old myocardial infarction: Secondary | ICD-10-CM | POA: Diagnosis not present

## 2023-11-18 DIAGNOSIS — I5022 Chronic systolic (congestive) heart failure: Secondary | ICD-10-CM | POA: Insufficient documentation

## 2023-11-18 DIAGNOSIS — I110 Hypertensive heart disease with heart failure: Secondary | ICD-10-CM | POA: Diagnosis not present

## 2023-11-18 DIAGNOSIS — Z955 Presence of coronary angioplasty implant and graft: Secondary | ICD-10-CM | POA: Diagnosis not present

## 2023-11-18 DIAGNOSIS — Z794 Long term (current) use of insulin: Secondary | ICD-10-CM | POA: Insufficient documentation

## 2023-11-18 DIAGNOSIS — Z8249 Family history of ischemic heart disease and other diseases of the circulatory system: Secondary | ICD-10-CM | POA: Diagnosis not present

## 2023-11-18 DIAGNOSIS — T82855A Stenosis of coronary artery stent, initial encounter: Secondary | ICD-10-CM | POA: Insufficient documentation

## 2023-11-18 DIAGNOSIS — I471 Supraventricular tachycardia, unspecified: Secondary | ICD-10-CM | POA: Diagnosis not present

## 2023-11-18 HISTORY — PX: LEFT HEART CATH AND CORONARY ANGIOGRAPHY: CATH118249

## 2023-11-18 LAB — GLUCOSE, CAPILLARY
Glucose-Capillary: 150 mg/dL — ABNORMAL HIGH (ref 70–99)
Glucose-Capillary: 124 mg/dL — ABNORMAL HIGH (ref 70–99)

## 2023-11-18 LAB — PREGNANCY, URINE: Preg Test, Ur: NEGATIVE

## 2023-11-18 SURGERY — LEFT HEART CATH AND CORONARY ANGIOGRAPHY
Anesthesia: LOCAL

## 2023-11-18 MED ORDER — SODIUM CHLORIDE 0.9% FLUSH: 3.0000 mL | Freq: Two times a day (BID) | INTRAVENOUS | Status: DC

## 2023-11-18 MED ORDER — LIDOCAINE HCL (PF) 1 % IJ SOLN
INTRAMUSCULAR | Status: AC
  Filled 2023-11-18: qty 30

## 2023-11-18 MED ORDER — MIDAZOLAM HCL 2 MG/2ML IJ SOLN
INTRAMUSCULAR | Status: DC | PRN
  Administered 2023-11-18: 1 mg via INTRAVENOUS

## 2023-11-18 MED ORDER — ACETAMINOPHEN 325 MG PO TABS
650.0000 mg | ORAL_TABLET | ORAL | Status: DC | PRN
Start: 2023-11-18 — End: 2023-11-18

## 2023-11-18 MED ORDER — LIDOCAINE HCL (PF) 1 % IJ SOLN
INTRAMUSCULAR | Status: DC | PRN
  Administered 2023-11-18: 10 mL

## 2023-11-18 MED ORDER — SODIUM CHLORIDE 0.9 % WEIGHT BASED INFUSION
1.0000 mL/kg/h | INTRAVENOUS | Status: DC
  Administered 2023-11-18: 1 mL/kg/h via INTRAVENOUS

## 2023-11-18 MED ORDER — SODIUM CHLORIDE 0.9 % IV SOLN: 250.0000 mL | INTRAVENOUS | Status: DC | PRN

## 2023-11-18 MED ORDER — IOHEXOL 350 MG/ML SOLN
INTRAVENOUS | Status: DC | PRN
  Administered 2023-11-18: 60 mL

## 2023-11-18 MED ORDER — CLOPIDOGREL BISULFATE 75 MG PO TABS: 75.0000 mg | ORAL_TABLET | ORAL | Status: DC

## 2023-11-18 MED ORDER — FENTANYL CITRATE (PF) 100 MCG/2ML IJ SOLN
INTRAMUSCULAR | Status: AC
  Filled 2023-11-18: qty 2

## 2023-11-18 MED ORDER — HYDRALAZINE HCL 20 MG/ML IJ SOLN: 10.0000 mg | INTRAMUSCULAR | Status: DC | PRN

## 2023-11-18 MED ORDER — SODIUM CHLORIDE 0.9 % IV SOLN: INTRAVENOUS | Status: DC

## 2023-11-18 MED ORDER — MIDAZOLAM HCL 2 MG/2ML IJ SOLN
INTRAMUSCULAR | Status: AC
  Filled 2023-11-18: qty 2

## 2023-11-18 MED ORDER — ASPIRIN 81 MG PO CHEW: 81.0000 mg | CHEWABLE_TABLET | ORAL | Status: DC

## 2023-11-18 MED ORDER — FENTANYL CITRATE (PF) 100 MCG/2ML IJ SOLN
INTRAMUSCULAR | Status: DC | PRN
  Administered 2023-11-18: 25 ug via INTRAVENOUS

## 2023-11-18 MED ORDER — ONDANSETRON HCL 4 MG/2ML IJ SOLN: 4.0000 mg | Freq: Four times a day (QID) | INTRAMUSCULAR | Status: DC | PRN

## 2023-11-18 MED ORDER — SODIUM CHLORIDE 0.9% FLUSH: 3.0000 mL | INTRAVENOUS | Status: DC | PRN

## 2023-11-18 MED ORDER — LABETALOL HCL 5 MG/ML IV SOLN: 10.0000 mg | INTRAVENOUS | Status: DC | PRN

## 2023-11-18 SURGICAL SUPPLY — 10 items
CATH INFINITI 5FR MULTPACK ANG (CATHETERS) IMPLANT
CLOSURE MYNX CONTROL 5F (Vascular Products) IMPLANT
KIT MICROPUNCTURE NIT STIFF (SHEATH) IMPLANT
PACK CARDIAC CATHETERIZATION (CUSTOM PROCEDURE TRAY) ×1 IMPLANT
PROTECTION STATION PRESSURIZED (MISCELLANEOUS) ×1
SET ATX-X65L (MISCELLANEOUS) IMPLANT
SHEATH PINNACLE 5F 10CM (SHEATH) IMPLANT
SHEATH PROBE COVER 6X72 (BAG) IMPLANT
STATION PROTECTION PRESSURIZED (MISCELLANEOUS) IMPLANT
WIRE EMERALD 3MM-J .035X150CM (WIRE) IMPLANT

## 2023-11-18 NOTE — Interval H&P Note (Signed)
History and Physical Interval Note:  11/18/2023 12:03 PM  Julie Hernandez  has presented today for surgery, with the diagnosis of cad.  The various methods of treatment have been discussed with the patient and family. After consideration of risks, benefits and other options for treatment, the patient has consented to  Procedure(s): LEFT HEART CATH AND CORONARY ANGIOGRAPHY (N/A) as a surgical intervention.  The patient's history has been reviewed, patient examined, no change in status, stable for surgery.  I have reviewed the patient's chart and labs.  Questions were answered to the patient's satisfaction.     Tonny Bollman

## 2023-11-18 NOTE — Discharge Instructions (Signed)
NO METFORMIN FOR 2 DAYS 

## 2023-11-18 NOTE — Progress Notes (Signed)
Report given to Vivian RN

## 2023-11-18 NOTE — Progress Notes (Signed)
Geoffry Paradise, NP notified of B/P and started  IV fluids as ordered

## 2023-11-19 ENCOUNTER — Other Ambulatory Visit (HOSPITAL_COMMUNITY): Payer: PPO

## 2023-11-19 ENCOUNTER — Encounter (HOSPITAL_COMMUNITY): Payer: Self-pay | Admitting: Cardiovascular Disease

## 2023-11-21 ENCOUNTER — Encounter (HOSPITAL_COMMUNITY): Payer: Self-pay | Admitting: Cardiology

## 2023-11-21 ENCOUNTER — Telehealth (HOSPITAL_COMMUNITY): Payer: Self-pay | Admitting: Cardiology

## 2023-11-21 NOTE — Telephone Encounter (Signed)
 Patient called to report she is still have CP off and on since cath Reports she took 2-3 doses of NTG over the past week  Was advised by Dr Wonda medicated stent could be placed if CP continues. No follow up scheduled with Dr Wonda  -pt would like to know what should she do about continued CP? -HF follow up scheduled for 1/28 as requested    Advised would review with provider

## 2023-11-21 NOTE — Telephone Encounter (Signed)
 Pt aware voiced understanding  As requested number for Dr Excell Seltzer sent via [employer]

## 2023-11-22 NOTE — Pre-Procedure Instructions (Signed)
 Instructed patient on the following items: Arrival time 0515 Nothing to eat or drink after midnight No meds AM of procedure Responsible person to drive you home and stay with you for 24 hrs Wash with special soap night before and morning of procedure If on anti-coagulant drug instructions ASA and Plavix - last dose 11/22/22

## 2023-11-25 ENCOUNTER — Ambulatory Visit (HOSPITAL_COMMUNITY)
Admission: RE | Admit: 2023-11-25 | Discharge: 2023-11-25 | Disposition: A | Discharge status: Home / Self Care | Payer: PPO | Source: Ambulatory Visit | Attending: Internal Medicine | Admitting: Internal Medicine

## 2023-11-25 ENCOUNTER — Other Ambulatory Visit: Payer: Self-pay

## 2023-11-25 ENCOUNTER — Other Ambulatory Visit (HOSPITAL_COMMUNITY): Payer: Self-pay

## 2023-11-25 ENCOUNTER — Ambulatory Visit (HOSPITAL_COMMUNITY): Payer: PPO

## 2023-11-25 ENCOUNTER — Telehealth (HOSPITAL_COMMUNITY): Payer: Self-pay | Admitting: Cardiology

## 2023-11-25 ENCOUNTER — Ambulatory Visit (HOSPITAL_COMMUNITY)
Admission: RE | Disposition: A | Discharge status: Home / Self Care | Payer: Self-pay | Source: Ambulatory Visit | Attending: Internal Medicine

## 2023-11-25 DIAGNOSIS — I255 Ischemic cardiomyopathy: Secondary | ICD-10-CM | POA: Insufficient documentation

## 2023-11-25 DIAGNOSIS — I5022 Chronic systolic (congestive) heart failure: Secondary | ICD-10-CM | POA: Diagnosis not present

## 2023-11-25 DIAGNOSIS — I2510 Atherosclerotic heart disease of native coronary artery without angina pectoris: Secondary | ICD-10-CM | POA: Diagnosis not present

## 2023-11-25 DIAGNOSIS — I110 Hypertensive heart disease with heart failure: Secondary | ICD-10-CM | POA: Insufficient documentation

## 2023-11-25 DIAGNOSIS — I252 Old myocardial infarction: Secondary | ICD-10-CM | POA: Insufficient documentation

## 2023-11-25 DIAGNOSIS — I5021 Acute systolic (congestive) heart failure: Secondary | ICD-10-CM

## 2023-11-25 DIAGNOSIS — Z87891 Personal history of nicotine dependence: Secondary | ICD-10-CM | POA: Diagnosis not present

## 2023-11-25 HISTORY — PX: ICD IMPLANT: EP1208

## 2023-11-25 LAB — GLUCOSE, CAPILLARY
Glucose-Capillary: 188 mg/dL — ABNORMAL HIGH (ref 70–99)
Glucose-Capillary: 174 mg/dL — ABNORMAL HIGH (ref 70–99)

## 2023-11-25 LAB — PREGNANCY, URINE: Preg Test, Ur: NEGATIVE

## 2023-11-25 LAB — MRSA NEXT GEN BY PCR, NASAL: MRSA by PCR Next Gen: NOT DETECTED

## 2023-11-25 SURGERY — ICD IMPLANT

## 2023-11-25 MED ORDER — FENTANYL CITRATE (PF) 100 MCG/2ML IJ SOLN
INTRAMUSCULAR | Status: AC
  Filled 2023-11-25: qty 2

## 2023-11-25 MED ORDER — SODIUM CHLORIDE 0.9 % IV SOLN
INTRAVENOUS | Status: AC
  Administered 2023-11-25: 80 mg
  Filled 2023-11-25: qty 2

## 2023-11-25 MED ORDER — MEXILETINE HCL 200 MG PO CAPS: 200.0000 mg | ORAL_CAPSULE | Freq: Two times a day (BID) | ORAL | 11 refills | Status: DC

## 2023-11-25 MED ORDER — SODIUM CHLORIDE 0.9 % IV SOLN
INTRAVENOUS | Status: DC
  Administered 2023-11-25: 250 mL via INTRAVENOUS

## 2023-11-25 MED ORDER — MUPIROCIN 2 % EX OINT
1.0000 | TOPICAL_OINTMENT | Freq: Once | CUTANEOUS | Status: AC
  Administered 2023-11-25: 1 via TOPICAL

## 2023-11-25 MED ORDER — ONDANSETRON HCL 4 MG/2ML IJ SOLN: 4.0000 mg | Freq: Four times a day (QID) | INTRAMUSCULAR | Status: DC | PRN

## 2023-11-25 MED ORDER — SODIUM CHLORIDE 0.9 % IV SOLN: 80.0000 mg | INTRAVENOUS | Status: AC

## 2023-11-25 MED ORDER — FENTANYL CITRATE (PF) 100 MCG/2ML IJ SOLN
INTRAMUSCULAR | Status: DC | PRN
  Administered 2023-11-25: 25 ug via INTRAVENOUS

## 2023-11-25 MED ORDER — CEFAZOLIN SODIUM-DEXTROSE 2-4 GM/100ML-% IV SOLN
INTRAVENOUS | Status: AC
  Administered 2023-11-25: 2 g via INTRAVENOUS
  Filled 2023-11-25: qty 100

## 2023-11-25 MED ORDER — ACETAMINOPHEN 325 MG PO TABS
325.0000 mg | ORAL_TABLET | ORAL | Status: DC | PRN
Start: 2023-11-25 — End: 2023-11-25

## 2023-11-25 MED ORDER — LIDOCAINE HCL (PF) 1 % IJ SOLN
INTRAMUSCULAR | Status: AC
  Filled 2023-11-25: qty 60

## 2023-11-25 MED ORDER — CHLORHEXIDINE GLUCONATE 4 % EX SOLN
4.0000 | Freq: Once | CUTANEOUS | Status: AC
Start: 2023-11-25 — End: 2023-11-25
  Administered 2023-11-25: 4 via TOPICAL

## 2023-11-25 MED ORDER — CEFAZOLIN SODIUM-DEXTROSE 2-4 GM/100ML-% IV SOLN
2.0000 g | INTRAVENOUS | Status: AC
Start: 2023-11-25 — End: 2023-11-25

## 2023-11-25 MED ORDER — FUROSEMIDE 40 MG PO TABS: 80.0000 mg | ORAL_TABLET | Freq: Every day | ORAL | 6 refills | Status: DC

## 2023-11-25 MED ORDER — MIDAZOLAM HCL 5 MG/5ML IJ SOLN
INTRAMUSCULAR | Status: DC | PRN
  Administered 2023-11-25 (×4): 1 mg via INTRAVENOUS

## 2023-11-25 MED ORDER — MUPIROCIN 2 % EX OINT
TOPICAL_OINTMENT | CUTANEOUS | Status: AC
  Filled 2023-11-25: qty 22

## 2023-11-25 MED ORDER — CEFAZOLIN SODIUM-DEXTROSE 2-4 GM/100ML-% IV SOLN
2.0000 g | Freq: Once | INTRAVENOUS | Status: AC
Start: 2023-11-25 — End: 2023-11-25
  Administered 2023-11-25: 2 g via INTRAVENOUS
  Filled 2023-11-25: qty 100

## 2023-11-25 MED ORDER — MIDAZOLAM HCL 2 MG/2ML IJ SOLN
INTRAMUSCULAR | Status: AC
Start: 2023-11-25 — End: ?
  Filled 2023-11-25: qty 2

## 2023-11-25 MED ORDER — LIDOCAINE HCL (PF) 1 % IJ SOLN
INTRAMUSCULAR | Status: DC | PRN
  Administered 2023-11-25: 60 mL

## 2023-11-25 SURGICAL SUPPLY — 6 items
CABLE SURGICAL S-101-97-12 (CABLE) ×1 IMPLANT
ELECT DEFIB PAD ADLT CADENCE (PAD) IMPLANT
ICD MOMENTUM D120 (ICD Generator) IMPLANT
LEAD RELIANCE 0137-59 (Lead) IMPLANT
SHEATH 9FR PRELUDE SNAP 13 (SHEATH) IMPLANT
TRAY PACEMAKER INSERTION (PACKS) ×1 IMPLANT

## 2023-11-25 NOTE — Progress Notes (Signed)
 Patient and daughter was given discharge instructions. Both verbalized understanding.

## 2023-11-25 NOTE — H&P (Signed)
 HPI Ms. Lemmerman is referred by Dr. RAYNALD for consideration for ICD insertion. The patient is a pleasant 53 yo woman with an ICM, EF 25% who is s/p MI. She has class 2 CHF symptoms under the direction of Dr. RAYNALD. She has not had syncope. She has a narrow QRS.  Allergies       Allergies  Allergen Reactions   Esomeprazole Magnesium Anaphylaxis   Ciprofibrate Itching   Ciprofloxacin Itching   Gabapentin Other (See Comments)      Achy - myalgia   Lubiprostone Diarrhea   Statins        Myalgia - flu like symptoms   Amlodipine Rash   Iron Rash and Swelling   Latex Rash and Swelling   Povidone Iodine Rash                Current Outpatient Medications  Medication Sig Dispense Refill   albuterol  (VENTOLIN  HFA) 108 (90 Base) MCG/ACT inhaler Inhale 2 puffs into the lungs every 4 (four) hours as needed for wheezing or shortness of breath.       aspirin  EC 81 MG tablet Take 1 tablet (81 mg total) by mouth daily. 30 tablet 11   blood glucose meter kit and supplies Dispense based on patient and insurance preference. Use up to four times daily as directed. (FOR ICD-10 E10.9, E11.9). 1 each 1   clopidogrel  (PLAVIX ) 75 MG tablet Take 1 tablet (75 mg total) by mouth daily. 30 tablet 11   Continuous Glucose Sensor (FREESTYLE LIBRE 3 PLUS SENSOR) MISC Use to monitor glucose continuously as directed. Change sensor every 15 days 6 each 3   empagliflozin  (JARDIANCE ) 10 MG TABS tablet Take 10 mg by mouth daily.       escitalopram  (LEXAPRO ) 10 MG tablet Take 10 mg by mouth daily.       Evolocumab  (REPATHA  SURECLICK) 140 MG/ML SOAJ Inject 140 mg into the skin every 14 (fourteen) days. 6 mL 3   ezetimibe  (ZETIA ) 10 MG tablet Take 1 tablet (10 mg total) by mouth daily at 6pm. 30 tablet 11   famotidine  (PEPCID ) 40 MG tablet Take 40 mg by mouth daily as needed for heartburn.       furosemide  (LASIX ) 20 MG tablet Take 3 tablets (60 mg total) by mouth as needed. For weight gain of 3lb in 24hrs, 5lb in a  week or edema 270 tablet 3   Insulin  Pen Needle 32G X 4 MM MISC Use with insulin  pen 100 each 0   metFORMIN  (GLUCOPHAGE ) 500 MG tablet Take 1 tablet (500 mg total) by mouth 2 (two) times daily with a meal.       metoprolol  succinate (TOPROL  XL) 25 MG 24 hr tablet Take 1 tablet (25 mg total) by mouth daily. 30 tablet 1   nitroGLYCERIN  (NITROSTAT ) 0.4 MG SL tablet PLACE 1 TABLET UNDER THE TONGUE AND ALLOW TO DISSOLVE SLOWLY, DO NOT CHEW OR SWALLOW. YOU MAY USE ADDITIONAL TABLETS EVERY 5 MINUTES BUT NO MORE THAN 3 TABLETS. 25 tablet 1   ondansetron  (ZOFRAN  ODT) 4 MG disintegrating tablet Take 1 tablet (4 mg total) by mouth every 8 (eight) hours as needed for nausea or vomiting. 10 tablet 0   potassium chloride  SA (KLOR-CON  M) 20 MEQ tablet Take 1 tablet (20 mEq total) by mouth daily. 90 tablet 3   RABEprazole (ACIPHEX) 20 MG tablet Take 40 mg by mouth daily.       Semaglutide , 1  MG/DOSE, 4 MG/3ML SOPN Inject 1 mg as directed once a week. 9 mL 1   spironolactone  (ALDACTONE ) 25 MG tablet Take 1 tablet (25 mg total) by mouth daily. 30 tablet 11      No current facility-administered medications for this visit.              Past Medical History:  Diagnosis Date   CHF (congestive heart failure) (HCC)     Coronary artery disease     COVID 12/03/2020   Diabetes mellitus without complication (HCC)     Hypertension     OSA on CPAP      moderate obstructive sleep apnea with an AHI of 20.6/h and he was placed on auto CPAP from 4 to 15 cm H2O          ROS:    All systems reviewed and negative except as noted in the HPI.          Past Surgical History:  Procedure Laterality Date   CORONARY BALLOON ANGIOPLASTY N/A 04/07/2020    Procedure: CORONARY BALLOON ANGIOPLASTY;  Surgeon: Wonda Sharper, MD;  Location: [facility] INVASIVE CV LAB;  Service: Cardiovascular;  Laterality: N/A;   CORONARY PRESSURE/FFR STUDY N/A 04/07/2020    Procedure: INTRAVASCULAR PRESSURE WIRE/FFR STUDY;  Surgeon: Wonda Sharper,  MD;  Location: [facility] INVASIVE CV LAB;  Service: Cardiovascular;  Laterality: N/A;   CORONARY STENT INTERVENTION N/A 04/07/2020    Procedure: CORONARY STENT INTERVENTION;  Surgeon: Wonda Sharper, MD;  Location: [facility] INVASIVE CV LAB;  Service: Cardiovascular;  Laterality: N/A;   CORONARY STENT INTERVENTION N/A 09/18/2022    Procedure: CORONARY STENT INTERVENTION;  Surgeon: Wonda Sharper, MD;  Location: [facility] INVASIVE CV LAB;  Service: Cardiovascular;  Laterality: N/A;   CORONARY ULTRASOUND/IVUS N/A 04/07/2020    Procedure: Intravascular Ultrasound/IVUS;  Surgeon: Wonda Sharper, MD;  Location: [facility] INVASIVE CV LAB;  Service: Cardiovascular;  Laterality: N/A;   LEFT HEART CATH AND CORONARY ANGIOGRAPHY N/A 04/07/2020    Procedure: LEFT HEART CATH AND CORONARY ANGIOGRAPHY;  Surgeon: Wonda Sharper, MD;  Location: [facility] INVASIVE CV LAB;  Service: Cardiovascular;  Laterality: N/A;   LEFT HEART CATH AND CORONARY ANGIOGRAPHY N/A 04/14/2021    Procedure: LEFT HEART CATH AND CORONARY ANGIOGRAPHY;  Surgeon: Cherrie Toribio SAUNDERS, MD;  Location: [facility] INVASIVE CV LAB;  Service: Cardiovascular;  Laterality: N/A;   RIGHT/LEFT HEART CATH AND CORONARY ANGIOGRAPHY N/A 09/17/2022    Procedure: RIGHT/LEFT HEART CATH AND CORONARY ANGIOGRAPHY;  Surgeon: Mady Bruckner, MD;  Location: [facility] INVASIVE CV LAB;  Service: Cardiovascular;  Laterality: N/A;                 Family History  Problem Relation Age of Onset   Diabetes Mother     Hypertension Mother     Heart disease Mother     Hyperlipidemia Mother              Social History         Socioeconomic History   Marital status: Divorced      Spouse name: Not on file   Number of children: 2   Years of education: Not on file   Highest education level: Not on file  Occupational History   Occupation: On disability  Tobacco Use   Smoking status: Former      Current packs/day: 0.00      Types: Cigarettes      Quit date: 02/18/2020      Years since quitting: 3.6    Smokeless tobacco:  Never  Vaping Use   Vaping status: Some Days  Substance and Sexual Activity   Alcohol  use: Yes      Comment: SOCIAL   Drug use: Never   Sexual activity: Not on file  Other Topics Concern   Not on file  Social History Narrative   Not on file    Social Determinants of Health    Financial Resource Strain: Not on file  Food Insecurity: Not on file  Transportation Needs: Not on file  Physical Activity: Not on file  Stress: Not on file  Social Connections: Not on file  Intimate Partner Violence: Not on file        BP 114/64   Pulse 89   Ht 5' 2 (1.575 m)   Wt 160 lb (72.6 kg)   SpO2 98%   BMI 29.26 kg/m    Physical Exam:   Well appearing woman, NAD Neck:  No JVD, no thyromegally Lymphatics:  No adenopathy Back:  No CVA tenderness Lungs:  Clear with no wheezes HEART:  Regular rate rhythm, no murmurs, no rubs, no clicks Abd:  soft, positive bowel sounds, no organomegally, no rebound, no guarding Ext:  2 plus pulses, no edema, no cyanosis, no clubbing Skin:  No rashes no nodules Neuro:  CN II through XII intact, motor grossly intact   EKG - reviewed. NSR     Assess/Plan: Chronic systolic heart failure/ICM - she is stable with class 2 symptoms and no angina. I offered her insertion of a DDD ICD and the risks/benefits/goals/expectations were reviewed and she wishes to proceed.    Danelle Aloysious Vangieson,MD

## 2023-11-25 NOTE — Telephone Encounter (Signed)
-----   Message from [place] sent at 11/22/2023  4:25 PM EST ----- Mostly NSR, but 25 runs of NSVT and 11.8% PVC burden. Suspect her symptoms are in part from high PVC burden.  Please start mexiletine 200 mg bid. This is an antiarrhythmic medication that will help suppress these extra beats (PVCs)

## 2023-11-25 NOTE — Telephone Encounter (Signed)
 Patient called.  Patient aware.

## 2023-11-25 NOTE — Discharge Instructions (Addendum)
 After Your ICD (Implantable Cardiac Defibrillator)   You have a Environmental Manager ICD  ACTIVITY Do not lift your arm above shoulder height for 1 week after your procedure. After 7 days, you may progress as below.  You should remove your sling 24 hours after your procedure, unless otherwise instructed by your provider.     Monday December 02, 2023  Tuesday December 03, 2023 Wednesday December 04, 2023 Thursday December 05, 2023   Do not lift, push, pull, or carry anything over 10 pounds with the affected arm until 6 weeks (Monday January 06, 2024 ) after your procedure.   You may drive AFTER your wound check, unless you have been told otherwise by your provider.   Ask your healthcare provider when you can go back to work   INCISION/Dressing If you are on a blood thinner such as Coumadin, Xarelto, Eliquis, Plavix , or Pradaxa please confirm with your provider when this should be resumed.   If large square, outer bandage is left in place, this can be removed after 24 hours from your procedure. Do not remove steri-strips or glue as below.   Monitor your defibrillator site for redness, swelling, and drainage. Call the device clinic at [phone] if you experience these symptoms or fever/chills.  If your incision is sealed with Steri-strips or staples, you may shower 7 days after your procedure or when told by your provider. Do not remove the steri-strips or let the shower hit directly on your site. You may wash around your site with soap and water.    If you were discharged in a sling, please do not wear this during the day more than 48 hours after your surgery unless otherwise instructed. This may increase the risk of stiffness and soreness in your shoulder.   Avoid lotions, ointments, or perfumes over your incision until it is well-healed.  You may use a hot tub or a pool AFTER your wound check appointment if the incision is completely closed.  Your ICD is designed to protect you from  life threatening heart rhythms. Because of this, you may receive a shock.   1 shock with no symptoms:  Call the office during business hours. 1 shock with symptoms (chest pain, chest pressure, dizziness, lightheadedness, shortness of breath, overall feeling unwell):  Call 911. If you experience 2 or more shocks in 24 hours:  Call 911. If you receive a shock, you should not drive for 6 months per the [place] DMV IF you receive appropriate therapy from your ICD.   ICD Alerts:  Some alerts are vibratory and others beep. These are NOT emergencies. Please call our office to let us  know. If this occurs at night or on weekends, it can wait until the next business day. Send a remote transmission.  If your device is capable of reading fluid status (for heart failure), you will be offered monthly monitoring to review this with you.   DEVICE MANAGEMENT Remote monitoring is used to monitor your ICD from home. This monitoring is scheduled every 91 days by our office. It allows us  to keep an eye on the functioning of your device to ensure it is working properly. You will routinely see your Electrophysiologist annually (more often if necessary).   You should receive your ID card for your new device in 4-8 weeks. Keep this card with you at all times once received. Consider wearing a medical alert bracelet or necklace.  Your ICD  may be MRI compatible. This will be discussed at your  next office visit/wound check.  You should avoid contact with strong electric or magnetic fields.   Do not use amateur (ham) radio equipment or electric (arc) welding torches. MP3 player headphones with magnets should not be used. Some devices are safe to use if held at least 12 inches (30 cm) from your defibrillator. These include power tools, lawn mowers, and speakers. If you are unsure if something is safe to use, ask your health care provider.  When using your cell phone, hold it to the ear that is on the opposite side from the  defibrillator. Do not leave your cell phone in a pocket over the defibrillator.  You may safely use electric blankets, heating pads, computers, and microwave ovens.  Call the office right away if: You have chest pain. You feel more than one shock. You feel more short of breath than you have felt before. You feel more light-headed than you have felt before. Your incision starts to open up.  This information is not intended to replace advice given to you by your health care provider. Make sure you discuss any questions you have with your health care provider.

## 2023-11-26 ENCOUNTER — Encounter (HOSPITAL_COMMUNITY): Payer: Self-pay | Admitting: Internal Medicine

## 2023-11-28 MED ORDER — FUROSEMIDE 40 MG PO TABS: 80.0000 mg | ORAL_TABLET | Freq: Every day | ORAL | 3 refills | Status: DC

## 2023-11-28 MED FILL — Midazolam HCl Inj 2 MG/2ML (Base Equivalent): INTRAMUSCULAR | Qty: 4 | Status: AC

## 2023-11-28 NOTE — Addendum Note (Signed)
 Addended by: Loula Marcella, Milagros Reap on: 11/28/2023 02:03 PM   Modules accepted: Orders

## 2023-11-29 ENCOUNTER — Other Ambulatory Visit (HOSPITAL_COMMUNITY): Payer: Self-pay | Admitting: Cardiology

## 2023-11-29 NOTE — Telephone Encounter (Signed)
 Opened in error

## 2023-12-02 ENCOUNTER — Other Ambulatory Visit (HOSPITAL_COMMUNITY): Payer: Self-pay

## 2023-12-02 DIAGNOSIS — I5022 Chronic systolic (congestive) heart failure: Secondary | ICD-10-CM

## 2023-12-02 MED ORDER — FUROSEMIDE 40 MG PO TABS: 80.0000 mg | ORAL_TABLET | Freq: Every day | ORAL | 5 refills | Status: DC

## 2023-12-11 ENCOUNTER — Ambulatory Visit: Payer: PPO

## 2023-12-12 ENCOUNTER — Ambulatory Visit: Payer: PPO | Attending: Internal Medicine

## 2023-12-12 ENCOUNTER — Telehealth: Payer: Self-pay | Admitting: Nurse Practitioner

## 2023-12-12 DIAGNOSIS — I5022 Chronic systolic (congestive) heart failure: Secondary | ICD-10-CM | POA: Diagnosis not present

## 2023-12-12 DIAGNOSIS — I255 Ischemic cardiomyopathy: Secondary | ICD-10-CM | POA: Diagnosis not present

## 2023-12-12 LAB — CUP PACEART INCLINIC DEVICE CHECK
Date Time Interrogation Session: 20250123165138
HighPow Impedance: 66 Ohm
Implantable Lead Connection Status: 753985
Implantable Lead Implant Date: 20250106
Implantable Lead Location: 753860
Implantable Lead Model: 137
Implantable Lead Serial Number: 301618
Implantable Pulse Generator Implant Date: 20250106
Lead Channel Impedance Value: 496 Ohm
Lead Channel Pacing Threshold Amplitude: 0.7 V
Lead Channel Pacing Threshold Pulse Width: 0.4 ms
Lead Channel Sensing Intrinsic Amplitude: 25 mV
Lead Channel Setting Pacing Amplitude: 3.5 V
Lead Channel Setting Pacing Pulse Width: 0.4 ms
Lead Channel Setting Sensing Sensitivity: 0.5 mV
Pulse Gen Serial Number: 222540

## 2023-12-12 NOTE — Progress Notes (Signed)
Normal ICD wound check. Wound well healed. Thresholds, sensing, and impedances consistent with implant measurements with 3.5V safety margin/auto capture until 3 month visit. No episodes.  Reviewed arm restrictions to continue for 6 weeks total post op. Reviewed shock plan.  Pt enrolled in remote follow-up.

## 2023-12-12 NOTE — Telephone Encounter (Signed)
2025 re enrollment form for [employer] PAP mailed to pt.

## 2023-12-12 NOTE — Patient Instructions (Signed)

## 2023-12-17 ENCOUNTER — Encounter (HOSPITAL_COMMUNITY): Payer: PPO

## 2023-12-23 ENCOUNTER — Ambulatory Visit (HOSPITAL_COMMUNITY)
Admission: RE | Admit: 2023-12-23 | Discharge: 2023-12-23 | Disposition: A | Discharge status: Home / Self Care | Payer: PPO | Source: Ambulatory Visit | Attending: Internal Medicine | Admitting: Internal Medicine

## 2023-12-23 ENCOUNTER — Encounter (HOSPITAL_COMMUNITY): Payer: Self-pay | Admitting: Internal Medicine

## 2023-12-23 VITALS — BP 100/68 | HR 85 | Wt 163.4 lb

## 2023-12-23 DIAGNOSIS — E119 Type 2 diabetes mellitus without complications: Secondary | ICD-10-CM | POA: Diagnosis not present

## 2023-12-23 DIAGNOSIS — Z6829 Body mass index (BMI) 29.0-29.9, adult: Secondary | ICD-10-CM | POA: Insufficient documentation

## 2023-12-23 DIAGNOSIS — Z955 Presence of coronary angioplasty implant and graft: Secondary | ICD-10-CM | POA: Diagnosis not present

## 2023-12-23 DIAGNOSIS — I110 Hypertensive heart disease with heart failure: Secondary | ICD-10-CM | POA: Diagnosis not present

## 2023-12-23 DIAGNOSIS — G8929 Other chronic pain: Secondary | ICD-10-CM | POA: Diagnosis not present

## 2023-12-23 DIAGNOSIS — I5022 Chronic systolic (congestive) heart failure: Secondary | ICD-10-CM | POA: Insufficient documentation

## 2023-12-23 DIAGNOSIS — I471 Supraventricular tachycardia, unspecified: Secondary | ICD-10-CM | POA: Insufficient documentation

## 2023-12-23 DIAGNOSIS — Z79899 Other long term (current) drug therapy: Secondary | ICD-10-CM | POA: Insufficient documentation

## 2023-12-23 DIAGNOSIS — Z95810 Presence of automatic (implantable) cardiac defibrillator: Secondary | ICD-10-CM | POA: Insufficient documentation

## 2023-12-23 DIAGNOSIS — Z7985 Long-term (current) use of injectable non-insulin antidiabetic drugs: Secondary | ICD-10-CM | POA: Diagnosis not present

## 2023-12-23 DIAGNOSIS — Z716 Tobacco abuse counseling: Secondary | ICD-10-CM | POA: Diagnosis not present

## 2023-12-23 DIAGNOSIS — I2510 Atherosclerotic heart disease of native coronary artery without angina pectoris: Secondary | ICD-10-CM | POA: Diagnosis not present

## 2023-12-23 DIAGNOSIS — G4733 Obstructive sleep apnea (adult) (pediatric): Secondary | ICD-10-CM | POA: Diagnosis not present

## 2023-12-23 DIAGNOSIS — I493 Ventricular premature depolarization: Secondary | ICD-10-CM | POA: Diagnosis not present

## 2023-12-23 DIAGNOSIS — Z7984 Long term (current) use of oral hypoglycemic drugs: Secondary | ICD-10-CM | POA: Diagnosis not present

## 2023-12-23 DIAGNOSIS — R0989 Other specified symptoms and signs involving the circulatory and respiratory systems: Secondary | ICD-10-CM | POA: Diagnosis not present

## 2023-12-23 DIAGNOSIS — I252 Old myocardial infarction: Secondary | ICD-10-CM | POA: Diagnosis not present

## 2023-12-23 DIAGNOSIS — J449 Chronic obstructive pulmonary disease, unspecified: Secondary | ICD-10-CM | POA: Insufficient documentation

## 2023-12-23 DIAGNOSIS — I2582 Chronic total occlusion of coronary artery: Secondary | ICD-10-CM | POA: Insufficient documentation

## 2023-12-23 DIAGNOSIS — R079 Chest pain, unspecified: Secondary | ICD-10-CM | POA: Diagnosis not present

## 2023-12-23 DIAGNOSIS — E669 Obesity, unspecified: Secondary | ICD-10-CM | POA: Insufficient documentation

## 2023-12-23 DIAGNOSIS — Z87891 Personal history of nicotine dependence: Secondary | ICD-10-CM | POA: Diagnosis not present

## 2023-12-23 DIAGNOSIS — F17290 Nicotine dependence, other tobacco product, uncomplicated: Secondary | ICD-10-CM | POA: Diagnosis not present

## 2023-12-23 DIAGNOSIS — Z794 Long term (current) use of insulin: Secondary | ICD-10-CM | POA: Insufficient documentation

## 2023-12-23 DIAGNOSIS — E782 Mixed hyperlipidemia: Secondary | ICD-10-CM

## 2023-12-23 LAB — BASIC METABOLIC PANEL
Anion gap: 14 (ref 5–15)
BUN: 15 mg/dL (ref 6–20)
CO2: 25 mmol/L (ref 22–32)
Calcium: 9 mg/dL (ref 8.9–10.3)
Chloride: 100 mmol/L (ref 98–111)
Creatinine, Ser: 0.91 mg/dL (ref 0.44–1.00)
GFR, Estimated: >60 mL/min (ref >60)
Glucose, Bld: 102 mg/dL — ABNORMAL HIGH (ref 70–99)
Potassium: 3.4 mmol/L — ABNORMAL LOW (ref 3.5–5.1)
Sodium: 139 mmol/L (ref 135–145)

## 2023-12-23 LAB — LIPID PANEL
Cholesterol: 150 mg/dL (ref 0–200)
HDL: 35 mg/dL — ABNORMAL LOW (ref >40)
LDL Cholesterol: 90 mg/dL (ref 0–99)
Total CHOL/HDL Ratio: 4.3 {ratio}
Triglycerides: 127 mg/dL (ref <150)
VLDL: 25 mg/dL (ref 0–40)

## 2023-12-23 LAB — BRAIN NATRIURETIC PEPTIDE: B Natriuretic Peptide: 381.5 pg/mL — ABNORMAL HIGH (ref 0.0–100.0)

## 2023-12-23 NOTE — Patient Instructions (Signed)
Good to see you today!  Labs done today, your results will be available in MyChart, we will contact you for abnormal readings.  Your physician recommends that you schedule a follow-up appointment in: 4 months(June) Call office in April to schedule an appointment  If you have any questions or concerns before your next appointment please send [place] a message through [place] or call our office at [phone].    TO LEAVE A MESSAGE FOR THE NURSE SELECT OPTION 2, PLEASE LEAVE A MESSAGE INCLUDING: YOUR NAME DATE OF BIRTH CALL BACK NUMBER REASON FOR CALL**this is important as we prioritize the call backs  YOU WILL RECEIVE A CALL BACK THE SAME DAY AS LONG AS YOU CALL BEFORE 4:00 PM  At the [facility], you and your health needs are our priority. As part of our continuing mission to provide you with exceptional heart care, we have created designated Provider Care Teams. These Care Teams include your primary Cardiologist (physician) and Advanced Practice Providers (APPs- Physician Assistants and Nurse Practitioners) who all work together to provide you with the care you need, when you need it.   You may see any of the following providers on your designated Care Team at your next follow up: Dr Arvilla Meres Dr Marca Ancona Dr. Dorthula Nettles Dr. Clearnce Hasten Amy Filbert Schilder, NP Robbie Lis, [name] [facility] [place], [name] Brynda Peon, NP [place] Lee, NP Karle Plumber, [name]   Please be sure to bring in all your medications bottles to every appointment.    Thank you for choosing [facility]

## 2023-12-23 NOTE — Progress Notes (Signed)
Advanced [facility] Failure Clinic Note  Date:  01/02/2021   ID:  Julie Hernandez, DOB 31-Dec-1970, MRN [id number]  Location: Home  Provider location: [facility] Type of Visit: Established patient  PCP:  Julie Macho, FNP  EP: Dr. Ladona Hernandez HF Cardiologist: Dr. Gala Hernandez  Chief Complaint: Heart Failure follow-up   HPI: Julie Hernandez is a 53 y.o. woman with COPD/ongoing tobacco abuse (vaping), premature CAD s/p anterior MI, diet-controlled DM2, chronic back pain, and systolic HF due to [facility] referred for further evaluation of CAD and systolic HF.  Had anterior MI in 2010. LAD stent at [facility]. Went to [facility] that same year and had  a second stent placed. Did well from a cardiac perspective until 3/21.   Cath 02/08/20 at [facility] for NSTEMI. 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 Was told she may need repeat stenting vs CABG. Referred here for second opinion.   We saw her in 5/21 with daily angina. Underwent MRI which showed EF 26% only minimal viability in anterior wall. Decision to proceed with PCI of LCX and LAD (ISR) over CABG. Had successful PCI of LCX and LAD with Dr. Excell Hernandez on 5/20.   Echo 9/21 EF 60-65%.  Echo 04/06/21 EF 50-55% mild anterior and apical HK  Admitted 10/23 with NSTEMI. Echo EF back down to 20% and RV mildly reduced. Underwent R/LHC which showed elevated right and left filling pressures and low CO;  occluded LAD stent and new CTO of RCA with L>>R collaterals. Underwent staged PCI with DES to mid LAD.   Seen in ED 12/25/22 with  SVT,  Echo 01/11/23 EF 25-30% with WMA c/w previous LAD infarct Sleep  01/08/23 Moderate OSA 20.6 CSA 1.6  Zio 2/24 50 runs NSVT (longest 11 beats) 8.5% PVCs  cMRI 8/24 showed LVEF 26%, RVEF 39%, large anterior infarct--> referred to EP for ICD.  Zio 12/24:  Mostly NSR, but 25  runs of NSVT and 11.8% PVC burden. Started on Mexilitine  Today she returns for AHF follow up. S/p ICD insertion 1/25. Overall feeling the best she's felt in a couple of months. Denies dizziness, edema, or PND/Orthopnea. Occasionally still feels a random palpitation but very seldom now. Occasional chest pain, feels like a tight squeeze that resolves with rest. Never bad enough that she has to take a nitroglycerin. No SOB. Appetite ok. No fever or chills. Weight at home 200 pounds. Taking all medications. Denies ETOH, tobacco or drug use. Vapes occasionally, trying to stop "cold-turkey". Tries to be compliant with CPAP.   Cardiac Studies - LHC 12/24: Patent left main with mild nonobstructive plaquing.  Patent LAD with moderate to severe focal in-stent restenosis in the distal stented segment, estimated at 70%. Widely patent circumflex stent. Totally occluded RCA with left-to-right collaterals, unchanged from the previous cath study.  - cMRI (8/24): showed LVEF 26%, RVEF 39%, large anterior infarct - R/LHC (10/23): RA 23 mean, PA 48/35 mean 39, PCWP mean 30, CO/CI (Fick) 3.7/2 Severe 2v CAD with 90% in stent re-stenonsis of mid LAD and CTO of mid RCA with L>>R collaterals - Echo (10/23): EF 20%, RV mildly down. - Echo (5/22): EF 50-55%, mid anterior and apical HK - Echo (9/21): EF 60-65% - Zio (7/21) 1. Sinus rhythm - avg HR of 82 2. 13 Ventricular Tachycardia runs occurred, the  run with the fastest interval lasting 12 beats with a max rate of 139 bpm (avg 110 bpm); the run with the fastest interval was also the longest 3. Very frequents PVCs (32.2%, [id number]), VE Couplets were rare (<1.0%, 2118),  4. Ventricular Bigeminy and Trigeminy were present. 5.. Multiple patient-triggered events associated  - cMRI (03/29/20) 1. Subendocardial late gadolinium enhancement consistent with prior infarct in the LAD territory. There is >50% transmural LGE suggesting nonviability in the basal to apical anterior  wall and apex. There is <50% transmural LGE suggesting viability in the basal to mid anteroseptum and apical septum 2.  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 Normal LV size with severe systolic dysfunction (EF 26%). Akinesis of basal to apical anterior/anteroseptal walls and apex 4.  Small RV size with normal systolic function (EF 62%)  Past Medical History:  Diagnosis Date   CHF (congestive heart failure) (HCC)    Coronary artery disease    COVID 12/03/2020   Diabetes mellitus without complication (HCC)    Hypertension    OSA on CPAP    moderate obstructive sleep apnea with an AHI of 20.6/h and he was placed on auto CPAP from 4 to 15 cm H2O   Current Outpatient Medications  Medication Sig Dispense Refill   albuterol (VENTOLIN HFA) 108 (90 Base) MCG/ACT inhaler Inhale 2 puffs into the lungs every 4 (four) hours as needed for wheezing or shortness of breath.     aspirin EC 81 MG tablet Take 1 tablet (81 mg total) by mouth daily. 30 tablet 6   Aspirin-Acetaminophen-Caffeine (GOODY HEADACHE PO) Take 1 packet by mouth daily as needed (headaches).     blood glucose meter kit and supplies Dispense based on patient and insurance preference. Use up to four times daily as directed. (FOR ICD-10 E10.9, E11.9). 1 each 1   clopidogrel (PLAVIX) 75 MG tablet Take 1 tablet (75 mg total) by mouth daily. 30 tablet 11   Continuous Glucose Sensor (FREESTYLE LIBRE 3 PLUS SENSOR) MISC Use to monitor glucose continuously as directed. Change sensor every 15 days 6 each 3   empagliflozin (JARDIANCE) 10 MG TABS tablet Take 10 mg by mouth daily.     escitalopram (LEXAPRO) 10 MG tablet Take 10 mg by mouth every evening.     Evolocumab (REPATHA SURECLICK) 140 MG/ML SOAJ Inject 140 mg into the skin every 14 (fourteen) days. 6 mL 3   ezetimibe (ZETIA) 10 MG tablet Take 1 tablet (10 mg total) by mouth daily at 6pm. 30 tablet 11   famotidine (PEPCID) 40 MG tablet Take 40 mg by mouth daily as needed for heartburn.      furosemide (LASIX) 40 MG tablet Take 2 tablets (80 mg total) by mouth daily. 60 tablet 5   Insulin Pen Needle 32G X 4 MM MISC Use with insulin pen 100 each 0   metFORMIN (GLUCOPHAGE) 500 MG tablet Take 1 tablet (500 mg total) by mouth 2 (two) times daily with a meal.     metoprolol succinate (TOPROL XL) 25 MG 24 hr tablet Take 1 tablet (25 mg total) by mouth daily. 30 tablet 1   mexiletine (MEXITIL) 200 MG capsule Take 1 capsule (200 mg total) by mouth 2 (two) times daily. 60 capsule 11   nitroGLYCERIN (NITROSTAT) 0.4 MG SL tablet PLACE 1 TABLET UNDER THE TONGUE AND ALLOW TO DISSOLVE SLOWLY, DO NOT CHEW OR SWALLOW. YOU MAY USE ADDITIONAL TABLETS EVERY 5 MINUTES BUT NO MORE THAN 3 TABLETS. 25 tablet 1  potassium chloride SA (KLOR-CON M) 20 MEQ tablet Take 2 tablets (40 mEq total) by mouth daily. 60 tablet 6   RABEprazole (ACIPHEX) 20 MG tablet Take 20 mg by mouth 2 (two) times daily.     Semaglutide, 1 MG/DOSE, 4 MG/3ML SOPN Inject 1 mg as directed once a week. 9 mL 1   spironolactone (ALDACTONE) 25 MG tablet Take 1 tablet (25 mg total) by mouth daily. 30 tablet 11   No current facility-administered medications for this encounter.   Allergies  Allergen Reactions   Esomeprazole Magnesium Anaphylaxis   Ciprofibrate Itching   Ciprofloxacin Itching   Gabapentin Other (See Comments)    Achy - myalgia   Lubiprostone Diarrhea   Statins     Myalgia - flu like symptoms   Amlodipine Rash   Iron Rash and Swelling   Latex Rash and Swelling   Povidone Iodine Rash   Social History   Socioeconomic History   Marital status: Divorced    Spouse name: Not on file   Number of children: 2   Years of education: Not on file   Highest education level: Not on file  Occupational History   Occupation: On disability  Tobacco Use   Smoking status: Former    Current packs/day: 0.00    Types: Cigarettes    Quit date: 02/18/2020    Years since quitting: 3.8   Smokeless tobacco: Never  Vaping Use    Vaping status: Some Days  Substance and Sexual Activity   Alcohol use: Yes    Comment: SOCIAL   Drug use: Never   Sexual activity: Not on file  Other Topics Concern   Not on file  Social History Narrative   Not on file   Social Drivers of Health   Financial Resource Strain: Not on file  Food Insecurity: Not on file  Transportation Needs: Not on file  Physical Activity: Not on file  Stress: Not on file  Social Connections: Not on file  Intimate Partner Violence: Not on file   FHx: M died at 31 from MI and HF D had stroke No brother and sisters  BP 100/68   Pulse 85   Wt 74.1 kg (163 lb 6.4 oz)   SpO2 98%   BMI 29.89 kg/m   Wt Readings from Last 3 Encounters:  12/23/23 74.1 kg (163 lb 6.4 oz)  11/25/23 72.6 kg (160 lb)  11/18/23 72.6 kg (160 lb)   PHYSICAL EXAM General:  well appearing.  No respiratory difficulty. Walked into clinic HEENT: normal Neck: supple. JVD flat. Carotids 2+ bilat; no bruits. No lymphadenopathy or thyromegaly appreciated. Cor: PMI nondisplaced. Regular rate & rhythm. No rubs, gallops or murmurs. Lungs: clear Abdomen: soft, nontender, nondistended. No hepatosplenomegaly. No bruits or masses. Good bowel sounds. Extremities: no cyanosis, clubbing, rash, edema  Neuro: alert & oriented x 3, cranial nerves grossly intact. moves all 4 extremities w/o difficulty. Affect pleasant.   ECG (personally reviewed): NSR 80s with incomplete LBBB  ICD interrogation: heartlogic still pending since its so new, active 1.2 hr/day, no therapies  ASSESSMENT & PLAN: 1. CAD - prior anterior MI w/ h/o PCI to mLAD and mLCX - Echo w/ drop in EF, from 55% >> 20%, RV mildly reduced - Cath (10/23): 90% in-stent restenosis of mLAD and CTO mRCA with left-to-right collaterals. LCx stent widely patent. -> S/P PCI DES mid LAD. - LHC 12/24: Patent left main with mild nonobstructive plaquing.  Patent LAD with moderate to severe focal in-stent restenosis in  the distal stented  segment, estimated at 70%. Widely patent circumflex stent. Totally occluded RCA with left-to-right collaterals, unchanged from the previous cath study. Plan for medical therapy for now.  - Minimal CP, has not required nitro - Continue DAPT. - Lipids managed by Lipid Clinic. LDL 12/24: 86 1 year ago. Repeat lipid panel today  2. Chronic Systolic Heart Failure - Previous history of ICM following prior anterior MI.  - EF previously 25-30%, improved post LAD and LCx PCI - Echo (9/21): EF 60-65% - Echo (04/06/21): EF 50-55% mild anterior and apical HK - Echo (10/23): EF down 20%, RV mildly reduced in setting of LAD and RCA infarcts, previously placed mLAD stent w/ 99% stenosis, CTO mRCA w/ L>R collaterals  - RHC (10/23):  w/ elevated R+ L filling pressures and low output, RA 23, PCWP 30, CI 2.0  - Echo 01/11/23 EF 25-30% with WMA c/w previous LAD infarct - cMRI (8/24): LVEF 26%, RVEF 39%, large anterior infarct - NYHA II, volume stable, appears euvolemic - Continue Lasix 80 mg daily, OK to take extra 40 mg PRN. - Continue Toprol XL 25 mg daily. - Off Entresto 24/26 mg bid due to low BP. BP too soft to add low-dose losartan today. - Continue spironolactone 25 mg daily.   - Continue Jardiance/Ozempic - Now s/p DDD ICD with Dr. Lawanda Cousins 11/25/23. - Labs today.  3. SVT/PVCs - Zio (05/2020) showed mostly SR, frequent PVCs (32% burden), 13 runs of VT - Zio 2/24 50 runs NSVT (longest 11 beats) 8.5% PVCs - EF previously improved w/o PVC suppression, so unlikely contributing to CM - Would like to avoid amio with young age and vaping - Zio 12/24:  Mostly NSR, but 25 runs of NSVT and 11.8% PVC burden. Started on Mexilitine - Continue mexiletine 200 mg BID - ECG today no PVCs - Continue CPAP, tries to use it daily  4. DM2 - improving control - Hgb A1c 13.1 (11/23)--> 8.5 (1/24)-> 6.2 (11/24) - Followed by PCP/ENDO - Continue SGLT2i and Ozempic   5. HL w/ LDL Goal < 55 - Statin intolerant. - Continue  Repatha + Zetia - Followed by Lipid Clinic   6. H/o Tobacco Use - Quit cigarettes, currently vapes  - Discussed need for cessation  7. Obesity - Body mass index is 29.89 kg/m. - Now on Ozempic  8. OSA - Sleep study 01/08/23 Moderate OSA 20.6 CSA 1.6 - Continue CPAP  - Follows with Dr. Mayford Knife  9. Carotid bruit - Carotid u/s with no significant dz   Alen Bleacher, NP  2:26 PM  Patient seen and examined with the above-signed Advanced Practice Provider and/or Housestaff. I personally reviewed laboratory data, imaging studies and relevant notes. I independently examined the patient and formulated the important aspects of the plan. I have edited the note to reflect any of my changes or salient points. I have personally discussed the plan with the patient and/or family.   Patient underwent recent coronary angiography for increasing CP. Also started on mexilitene for frequent PVCs.   Cath showed approximately 70% ISR of LAD stent (I reviewed images with her today personally) and discussed with interventional team. Wil lcontinue medical rx for now. Consider drug-coated angioplasty when available.   She states she feels much better. Minimal, if any, CP. Has been compliant with meds. No edema, orthopnea or PND  Still vaping  General:  Well appearing. No resp difficulty HEENT: normal Neck: supple. no JVD. Carotids 2+ bilat; no bruits. No lymphadenopathy  or thryomegaly appreciated. Cor: PMI nondisplaced. Regular rate & rhythm. No rubs, gallops or murmurs. Lungs: clear Abdomen: soft, nontender, nondistended. No hepatosplenomegaly. No bruits or masses. Good bowel sounds. Extremities: no cyanosis, clubbing, rash, edema Neuro: alert & orientedx3, cranial nerves grossly intact. moves all 4 extremities w/o difficulty. Affect pleasant  ECG SR with no PVCs Personally reviewed  She is much improved with suppression of PVCs. Will continue to manage CAD medically.   Discussed need to stop vaping  so that she has a "Plan B" to allow advanced therapies should her HF get worse  Continue DAPT, statin.  Doing well with weight loss on Ozempic  Arvilla Meres, MD  6:02 PM

## 2023-12-27 ENCOUNTER — Other Ambulatory Visit (HOSPITAL_COMMUNITY): Payer: Self-pay

## 2023-12-27 DIAGNOSIS — I5022 Chronic systolic (congestive) heart failure: Secondary | ICD-10-CM

## 2023-12-27 MED ORDER — METOPROLOL SUCCINATE ER 25 MG PO TB24: 25.0000 mg | ORAL_TABLET | Freq: Every day | ORAL | 11 refills | Status: AC

## 2023-12-30 ENCOUNTER — Telehealth (HOSPITAL_COMMUNITY): Payer: Self-pay | Admitting: *Deleted

## 2023-12-30 DIAGNOSIS — I5022 Chronic systolic (congestive) heart failure: Secondary | ICD-10-CM

## 2023-12-30 NOTE — Telephone Encounter (Signed)
-----   Message from Jules Oar sent at 12/27/2023  6:43 PM EST ----- Regarding: FW: Please double kcl to 40 daily. Recheck bmet 2 weeks. Thanks ----- Message ----- From: Dannis Dy, Lab [facility] [place] Sent: 12/23/2023   4:02 PM EST To: Mardell Shade, MD

## 2023-12-30 NOTE — Telephone Encounter (Signed)
 Pt aware, agreeable, and verbalized understanding, she has not been taking KCL so will start 40 meq daily, repeat lab sch 2/24

## 2023-12-31 ENCOUNTER — Telehealth: Payer: Self-pay

## 2023-12-31 NOTE — Telephone Encounter (Signed)
Pt notified that Ozempic from PAP is here for her to pick up.

## 2024-01-13 ENCOUNTER — Ambulatory Visit (HOSPITAL_COMMUNITY)
Admission: RE | Admit: 2024-01-13 | Discharge: 2024-01-13 | Disposition: A | Discharge status: Home / Self Care | Payer: PPO | Source: Ambulatory Visit | Attending: Cardiology | Admitting: Cardiology

## 2024-01-13 DIAGNOSIS — I5022 Chronic systolic (congestive) heart failure: Secondary | ICD-10-CM | POA: Diagnosis present

## 2024-01-13 LAB — BASIC METABOLIC PANEL
Anion gap: 10 (ref 5–15)
BUN: 16 mg/dL (ref 6–20)
CO2: 27 mmol/L (ref 22–32)
Calcium: 9.5 mg/dL (ref 8.9–10.3)
Chloride: 104 mmol/L (ref 98–111)
Creatinine, Ser: 1.04 mg/dL — ABNORMAL HIGH (ref 0.44–1.00)
GFR, Estimated: >60 mL/min (ref >60)
Glucose, Bld: 128 mg/dL — ABNORMAL HIGH (ref 70–99)
Potassium: 4.1 mmol/L (ref 3.5–5.1)
Sodium: 141 mmol/L (ref 135–145)

## 2024-01-24 ENCOUNTER — Other Ambulatory Visit (HOSPITAL_COMMUNITY): Payer: Self-pay | Admitting: Internal Medicine

## 2024-01-28 ENCOUNTER — Ambulatory Visit: Payer: PPO | Admitting: Nurse Practitioner

## 2024-01-28 DIAGNOSIS — E1165 Type 2 diabetes mellitus with hyperglycemia: Secondary | ICD-10-CM

## 2024-01-28 DIAGNOSIS — Z7985 Long-term (current) use of injectable non-insulin antidiabetic drugs: Secondary | ICD-10-CM

## 2024-01-28 DIAGNOSIS — Z7984 Long term (current) use of oral hypoglycemic drugs: Secondary | ICD-10-CM

## 2024-02-17 ENCOUNTER — Ambulatory Visit: Admitting: Nurse Practitioner

## 2024-02-17 ENCOUNTER — Encounter: Payer: Self-pay | Admitting: Nurse Practitioner

## 2024-02-17 VITALS — BP 98/70 | HR 58 | Ht 62.0 in | Wt 158.0 lb

## 2024-02-17 DIAGNOSIS — E1165 Type 2 diabetes mellitus with hyperglycemia: Secondary | ICD-10-CM | POA: Diagnosis not present

## 2024-02-17 DIAGNOSIS — E782 Mixed hyperlipidemia: Secondary | ICD-10-CM | POA: Diagnosis not present

## 2024-02-17 DIAGNOSIS — E1159 Type 2 diabetes mellitus with other circulatory complications: Secondary | ICD-10-CM

## 2024-02-17 DIAGNOSIS — Z794 Long term (current) use of insulin: Secondary | ICD-10-CM | POA: Diagnosis not present

## 2024-02-17 DIAGNOSIS — Z7984 Long term (current) use of oral hypoglycemic drugs: Secondary | ICD-10-CM | POA: Diagnosis not present

## 2024-02-17 DIAGNOSIS — Z7985 Long-term (current) use of injectable non-insulin antidiabetic drugs: Secondary | ICD-10-CM | POA: Diagnosis not present

## 2024-02-17 LAB — POCT GLYCOSYLATED HEMOGLOBIN (HGB A1C): Hemoglobin A1C: 6.9 % — AB (ref 4.0–5.6)

## 2024-02-17 MED ORDER — FREESTYLE LIBRE 3 PLUS SENSOR MISC: 3 refills | Status: AC

## 2024-02-17 MED ORDER — SEMAGLUTIDE (1 MG/DOSE) 4 MG/3ML ~~LOC~~ SOPN: 1.0000 mg | PEN_INJECTOR | SUBCUTANEOUS | 1 refills | Status: DC

## 2024-02-17 MED ORDER — METFORMIN HCL 500 MG PO TABS: 1000.0000 mg | ORAL_TABLET | Freq: Two times a day (BID) | ORAL | 3 refills | Status: DC

## 2024-02-17 NOTE — Progress Notes (Signed)
 Endocrinology Follow Up Note       02/17/2024, 12:04 PM   Subjective:    Patient ID: Julie Hernandez, female    DOB: 10-15-1971.  Julie Hernandez is being seen in follow up after being seen in consultation for management of currently uncontrolled symptomatic diabetes requested by  Josph Macho, FNP.   Past Medical History:  Diagnosis Date   CHF (congestive heart failure) (HCC)    Coronary artery disease    COVID 12/03/2020   Diabetes mellitus without complication (HCC)    Hypertension    OSA on CPAP    moderate obstructive sleep apnea with an AHI of 20.6/h and he was placed on auto CPAP from 4 to 15 cm H2O    Past Surgical History:  Procedure Laterality Date   CORONARY BALLOON ANGIOPLASTY N/A 04/07/2020   Procedure: CORONARY BALLOON ANGIOPLASTY;  Surgeon: Tonny Bollman, MD;  Location: [facility] INVASIVE CV LAB;  Service: Cardiovascular;  Laterality: N/A;   CORONARY PRESSURE/FFR STUDY N/A 04/07/2020   Procedure: INTRAVASCULAR PRESSURE WIRE/FFR STUDY;  Surgeon: Tonny Bollman, MD;  Location: [facility] INVASIVE CV LAB;  Service: Cardiovascular;  Laterality: N/A;   CORONARY STENT INTERVENTION N/A 04/07/2020   Procedure: CORONARY STENT INTERVENTION;  Surgeon: Tonny Bollman, MD;  Location: [facility] INVASIVE CV LAB;  Service: Cardiovascular;  Laterality: N/A;   CORONARY STENT INTERVENTION N/A 09/18/2022   Procedure: CORONARY STENT INTERVENTION;  Surgeon: Tonny Bollman, MD;  Location: [facility] INVASIVE CV LAB;  Service: Cardiovascular;  Laterality: N/A;   CORONARY ULTRASOUND/IVUS N/A 04/07/2020   Procedure: Intravascular Ultrasound/IVUS;  Surgeon: Tonny Bollman, MD;  Location: [facility] INVASIVE CV LAB;  Service: Cardiovascular;  Laterality: N/A;   ICD IMPLANT N/A 11/25/2023   Procedure: ICD IMPLANT;  Surgeon: Marinus Maw, MD;  Location: [facility] INVASIVE CV LAB;  Service: Cardiovascular;  Laterality: N/A;   LEFT HEART CATH AND CORONARY  ANGIOGRAPHY N/A 04/07/2020   Procedure: LEFT HEART CATH AND CORONARY ANGIOGRAPHY;  Surgeon: Tonny Bollman, MD;  Location: [facility] INVASIVE CV LAB;  Service: Cardiovascular;  Laterality: N/A;   LEFT HEART CATH AND CORONARY ANGIOGRAPHY N/A 04/14/2021   Procedure: LEFT HEART CATH AND CORONARY ANGIOGRAPHY;  Surgeon: Dolores Patty, MD;  Location: [facility] INVASIVE CV LAB;  Service: Cardiovascular;  Laterality: N/A;   LEFT HEART CATH AND CORONARY ANGIOGRAPHY N/A 11/18/2023   Procedure: LEFT HEART CATH AND CORONARY ANGIOGRAPHY;  Surgeon: Tonny Bollman, MD;  Location: [facility] INVASIVE CV LAB;  Service: Cardiovascular;  Laterality: N/A;   RIGHT/LEFT HEART CATH AND CORONARY ANGIOGRAPHY N/A 09/17/2022   Procedure: RIGHT/LEFT HEART CATH AND CORONARY ANGIOGRAPHY;  Surgeon: Yvonne Kendall, MD;  Location: [facility] INVASIVE CV LAB;  Service: Cardiovascular;  Laterality: N/A;    Social History   Socioeconomic History   Marital status: Divorced    Spouse name: Not on file   Number of children: 2   Years of education: Not on file   Highest education level: Not on file  Occupational History   Occupation: On disability  Tobacco Use   Smoking status: Former    Current packs/day: 0.00    Types: Cigarettes    Quit date: 02/18/2020    Years since quitting: 4.0  Smokeless tobacco: Never  Vaping Use   Vaping status: Some Days  Substance and Sexual Activity   Alcohol use: Yes    Comment: SOCIAL   Drug use: Never   Sexual activity: Not on file  Other Topics Concern   Not on file  Social History Narrative   Not on file   Social Drivers of Health   Financial Resource Strain: Not on file  Food Insecurity: Not on file  Transportation Needs: Not on file  Physical Activity: Not on file  Stress: Not on file  Social Connections: Not on file    Family History  Problem Relation Age of Onset   Diabetes Mother    Hypertension Mother    Heart disease Mother    Hyperlipidemia Mother     Outpatient Encounter  Medications as of 02/17/2024  Medication Sig   albuterol (VENTOLIN HFA) 108 (90 Base) MCG/ACT inhaler Inhale 2 puffs into the lungs every 4 (four) hours as needed for wheezing or shortness of breath.   aspirin EC 81 MG tablet Take 1 tablet (81 mg total) by mouth daily.   Aspirin-Acetaminophen-Caffeine (GOODY HEADACHE PO) Take 1 packet by mouth daily as needed (headaches).   blood glucose meter kit and supplies Dispense based on patient and insurance preference. Use up to four times daily as directed. (FOR ICD-10 E10.9, E11.9).   clopidogrel (PLAVIX) 75 MG tablet Take 1 tablet (75 mg total) by mouth daily.   escitalopram (LEXAPRO) 10 MG tablet Take 10 mg by mouth every evening.   Evolocumab (REPATHA SURECLICK) 140 MG/ML SOAJ Inject 140 mg into the skin every 14 (fourteen) days.   ezetimibe (ZETIA) 10 MG tablet Take 1 tablet (10 mg total) by mouth daily at 6pm.   famotidine (PEPCID) 40 MG tablet Take 40 mg by mouth daily as needed for heartburn.   furosemide (LASIX) 40 MG tablet Take 2 tablets (80 mg total) by mouth daily.   Insulin Pen Needle 32G X 4 MM MISC Use with insulin pen   JARDIANCE 10 MG TABS tablet Take 1 tablet (10 mg total) by mouth daily before breakfast.   metoprolol succinate (TOPROL XL) 25 MG 24 hr tablet Take 1 tablet (25 mg total) by mouth daily.   mexiletine (MEXITIL) 200 MG capsule Take 1 capsule (200 mg total) by mouth 2 (two) times daily.   nitroGLYCERIN (NITROSTAT) 0.4 MG SL tablet PLACE 1 TABLET UNDER THE TONGUE AND ALLOW TO DISSOLVE SLOWLY, DO NOT CHEW OR SWALLOW. YOU MAY USE ADDITIONAL TABLETS EVERY 5 MINUTES BUT NO MORE THAN 3 TABLETS.   potassium chloride SA (KLOR-CON M) 20 MEQ tablet Take 2 tablets (40 mEq total) by mouth daily.   RABEprazole (ACIPHEX) 20 MG tablet Take 20 mg by mouth 2 (two) times daily.   spironolactone (ALDACTONE) 25 MG tablet Take 1 tablet (25 mg total) by mouth daily.   [DISCONTINUED] Continuous Glucose Sensor (FREESTYLE LIBRE 3 PLUS SENSOR) MISC  Use to monitor glucose continuously as directed. Change sensor every 15 days   [DISCONTINUED] metFORMIN (GLUCOPHAGE) 500 MG tablet Take 1 tablet (500 mg total) by mouth 2 (two) times daily with a meal.   [DISCONTINUED] Semaglutide, 1 MG/DOSE, 4 MG/3ML SOPN Inject 1 mg as directed once a week.   Continuous Glucose Sensor (FREESTYLE LIBRE 3 PLUS SENSOR) MISC Use to monitor glucose continuously as directed. Change sensor every 15 days   metFORMIN (GLUCOPHAGE) 500 MG tablet Take 2 tablets (1,000 mg total) by mouth 2 (two) times daily with a meal.  Semaglutide, 1 MG/DOSE, 4 MG/3ML SOPN Inject 1 mg as directed once a week.   No facility-administered encounter medications on file as of 02/17/2024.    ALLERGIES: Allergies  Allergen Reactions   Esomeprazole Magnesium Anaphylaxis   Ciprofibrate Itching   Ciprofloxacin Itching   Gabapentin Other (See Comments)    Achy - myalgia   Lubiprostone Diarrhea   Statins     Myalgia - flu like symptoms   Amlodipine Rash   Iron Rash and Swelling   Latex Rash and Swelling   Povidone Iodine Rash    VACCINATION STATUS:  There is no immunization history on file for this patient.  Diabetes She presents for her follow-up diabetic visit. She has type 2 diabetes mellitus. Onset time: diagnosed with GD at ate 27 then diabetes at age 63. Her disease course has been stable. There are no hypoglycemic associated symptoms. Associated symptoms include fatigue. There are no hypoglycemic complications. Symptoms are stable. 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 Risk factors for coronary artery disease include tobacco exposure, diabetes mellitus, dyslipidemia, family history, obesity and hypertension. Current diabetic treatment includes oral agent (dual therapy) (And Ozempic). She is compliant with treatment most of the time. Her weight is decreasing steadily. She is following a generally healthy diet. Meal planning includes avoidance of  concentrated sweets. She has not had a previous visit with a dietitian. She participates in exercise three times a week. Her home blood glucose trend is fluctuating minimally. Her overall blood glucose range is 110-130 mg/dl. (She presents today with her CGM showing at goal glycemic profile overall.  Her POCT A1c today is 6.9%, increasing from last visit of 6.2%. Analysis of her CGM shows TIR 98%, TAR 1%, TBR 1% with a GMI of 6.2%.  She did go a period of time without her medications due to ICD placement.  She restarted her Ozempic 1 mg after being off for about a month or so and noted significant nausea.) An ACE inhibitor/angiotensin II receptor blocker is not being taken. She sees a podiatrist.Eye exam is not current (due now).     Review of systems  Constitutional: + steadily decreasing body weight, current Body mass index is 28.9 kg/m., no fatigue, no subjective hyperthermia, no subjective hypothermia Eyes: no blurry vision, no xerophthalmia ENT: no sore throat, no nodules palpated in throat, no dysphagia/odynophagia, no hoarseness Cardiovascular: no chest pain, no shortness of breath, no palpitations, no leg swelling Respiratory: no cough, no shortness of breath Gastrointestinal: no nausea/vomiting/diarrhea, + intermittent constipation(Hx IBS-C) Musculoskeletal: no muscle/joint aches Skin: no rashes, no hyperemia Neurological: no tremors, no numbness, no tingling, no dizziness Psychiatric: no depression, no anxiety  Objective:     BP 98/70 (BP Location: Left Arm, Patient Position: Sitting, Cuff Size: Large)   Pulse (!) 58   Ht 5\' 2"  (1.575 m)   Wt 158 lb (71.7 kg)   BMI 28.90 kg/m   Wt Readings from Last 3 Encounters:  02/17/24 158 lb (71.7 kg)  12/23/23 163 lb 6.4 oz (74.1 kg)  11/25/23 160 lb (72.6 kg)     BP Readings from Last 3 Encounters:  02/17/24 98/70  12/23/23 100/68  11/25/23 100/70      Physical Exam- Limited  Constitutional:  Body mass index is 28.9 kg/m. ,  not in acute distress, normal state of mind Eyes:  EOMI, no exophthalmos Musculoskeletal: no gross deformities, strength intact in all four extremities, no gross restriction of joint movements Skin:  no rashes, no  hyperemia Neurological: no tremor with outstretched hands   Diabetic Foot Exam - Simple   No data filed     CMP ( most recent) CMP     Component Value Date/Time   NA 141 01/13/2024 1047   NA 142 11/11/2023 1426   K 4.1 01/13/2024 1047   CL 104 01/13/2024 1047   CO2 27 01/13/2024 1047   GLUCOSE 128 (H) 01/13/2024 1047   BUN 16 01/13/2024 1047   BUN 24 11/11/2023 1426   CREATININE 1.04 (H) 01/13/2024 1047   CALCIUM 9.5 01/13/2024 1047   PROT 7.0 12/28/2022 1154   PROT 6.2 06/21/2020 0922   ALBUMIN 4.2 12/28/2022 1154   ALBUMIN 4.2 06/21/2020 0922   AST 19 12/28/2022 1154   ALT 16 12/28/2022 1154   ALKPHOS 68 12/28/2022 1154   BILITOT 0.7 12/28/2022 1154   BILITOT <0.2 06/21/2020 0922   GFRNONAA >60 01/13/2024 1047   GFRAA >60 07/26/2020 1208     Diabetic Labs (most recent): Lab Results  Component Value Date   HGBA1C 6.9 (A) 02/17/2024   HGBA1C 6.2 (A) 09/30/2023   HGBA1C 6.4 (A) 05/30/2023     Lipid Panel ( most recent) Lipid Panel     Component Value Date/Time   CHOL 150 12/23/2023 1447   CHOL 113 06/21/2020 0922   TRIG 127 12/23/2023 1447   HDL 35 (L) 12/23/2023 1447   HDL 33 (L) 06/21/2020 0922   CHOLHDL 4.3 12/23/2023 1447   VLDL 25 12/23/2023 1447   LDLCALC 90 12/23/2023 1447   LDLCALC 54 06/21/2020 0922   LABVLDL 26 06/21/2020 0922      Lab Results  Component Value Date   TSH 1.461 11/05/2023   TSH 1.125 12/28/2022           Assessment & Plan:   1) Type 2 diabetes mellitus with hyperglycemia, with long-term current use of insulin (HCC)  She presents today with her CGM showing at goal glycemic profile overall.  Her POCT A1c today is 6.9%, increasing from last visit of 6.2%. Analysis of her CGM shows TIR 98%, TAR 1%, TBR 1%  with a GMI of 6.2%.  She did go a period of time without her medications due to ICD placement.  She restarted her Ozempic 1 mg after being off for about a month or so and noted significant nausea.  - Julie Hernandez has currently uncontrolled symptomatic type 2 DM since 53 years of age.   -Recent labs reviewed.  - I had a long discussion with her about the progressive nature of diabetes and the pathology behind its complications. 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 These are all discussed in detail with her.  The following Lifestyle Medicine recommendations according to American College of Lifestyle Medicine [facility]) were discussed and offered to patient and she agrees to start the journey:  A. Whole Foods, Plant-based plate comprising of fruits and vegetables, plant-based proteins, whole-grain carbohydrates was discussed in detail with the patient.   A list for source of those nutrients were also provided to the patient.  Patient will use only water or unsweetened tea for hydration. B.  The need to stay away from risky substances including alcohol, smoking; obtaining 7 to 9 hours of restorative sleep, at least 150 minutes of moderate intensity exercise weekly, the importance of healthy social connections,  and stress reduction techniques were discussed. C.  A full color page of  Calorie density of various food groups per pound showing examples of each food groups was provided to the patient.  - Nutritional counseling repeated at each appointment due to patients tendency to fall back in to old habits.  - The patient admits there is a room for improvement in their diet and drink choices. -  Suggestion is made for the patient to avoid simple carbohydrates from their diet including Cakes, Sweet Desserts / Pastries, Ice Cream, Soda (diet and  regular), Sweet Tea, Candies, Chips, Cookies, Sweet Pastries, Store Bought Juices, Alcohol in Excess of 1-2 drinks a day, Artificial Sweeteners, Coffee Creamer, and "Sugar-free" Products. This will help patient to have stable blood glucose profile and potentially avoid unintended weight gain.   - I encouraged the patient to switch to unprocessed or minimally processed complex starch and increased protein intake (animal or plant source), fruits, and vegetables.   - Patient is advised to stick to a routine mealtimes to eat 3 meals a day and avoid unnecessary snacks (to snack only to correct hypoglycemia).  - I have approached her with the following individualized plan to manage her diabetes and patient agrees:   -She is advised to lower her Ozempic to 0.5 mg SQ weekly (37 clicks on the 1 mg pen) until next visit where we may end up increasing it once again, continue Metformin 1000 mg po twice daily with meals, and continue Jardiance 10 mg po daily (per cardiology recommendations).    -she is encouraged to continue monitoring glucose 4 times daily (using her CGM), before meals and before bed, and to call the clinic if she has readings less than 70 or above 300 for 3 tests in a row.  - she is warned not to take insulin without proper monitoring per orders. - Adjustment parameters are given to her for hypo and hyperglycemia in writing.  She is excellent candidate for incretin therapy with GLP1 given her MI history.  - Specific targets for  A1c; LDL, HDL, and Triglycerides were discussed with the patient.  2) Blood Pressure /Hypertension:  her blood pressure is controlled to target.   she is advised to continue her current medications as prescribed by cardiology.  3) Lipids/Hyperlipidemia:    Review of her recent lipid panel from 12/23/23 showed controlled LDL at 90.  she is advised to continue Zetia 10 mg daily at bedtime.  She has intolerance to statins.  4)  Weight/Diet:  her Body mass index is  28.9 kg/m.  -  clearly complicating her diabetes care.   she is a candidate for weight loss. I discussed with her the fact that loss of 5 - 10% of her  current body weight will have the most impact on her diabetes management.  Exercise, and detailed carbohydrates information provided  -  detailed on discharge instructions.  5) Chronic Care/Health Maintenance: -she is not on ACEI/ARB or Statin medications and is encouraged to initiate and continue to follow up with Ophthalmology, Dentist, Podiatrist at least yearly or according to recommendations, and advised to [facility] altogether . I have recommended yearly flu vaccine and pneumonia vaccine at least every 5 years; moderate intensity exercise for up to 150 minutes weekly; and sleep for at least 7 hours a day.  - she is advised to maintain close follow up with Josph Macho, FNP for primary care needs, as well as her other providers for optimal and coordinated care.  I spent  41  minutes in the care of the patient today including review of labs from CMP, Lipids, Thyroid Function, Hematology (current and previous including abstractions from other facilities); face-to-face time discussing  her blood glucose readings/logs, discussing hypoglycemia and hyperglycemia episodes and symptoms, medications doses, her options of short and long term treatment based on the latest standards of care / guidelines;  discussion about incorporating lifestyle medicine;  and documenting the encounter. Risk reduction counseling performed per USPSTF guidelines to reduce obesity and cardiovascular risk factors.     Please refer to Patient Instructions for Blood Glucose Monitoring and Insulin/Medications Dosing Guide"  in media tab for additional information. Please  also refer to " Patient Self Inventory" in the Media  tab for reviewed elements of pertinent patient history.  Samson Frederic participated in the discussions, expressed understanding, and voiced  agreement with the above plans.  All questions were answered to her satisfaction. she is encouraged to contact clinic should she have any questions or concerns prior to her return visit.     Follow up plan: - Return in about 4 months (around 06/18/2024) for Diabetes F/U with A1c in office, No previsit labs, Bring meter and logs.   Ronny Bacon, [facility] [address] [place], [place] [address] Phone: [phone] Fax: [phone]  02/17/2024, 12:04 PM

## 2024-02-18 ENCOUNTER — Telehealth: Payer: Self-pay | Admitting: Pharmacy Technician

## 2024-02-18 ENCOUNTER — Other Ambulatory Visit (HOSPITAL_COMMUNITY): Payer: Self-pay

## 2024-02-18 NOTE — Telephone Encounter (Signed)
 Pharmacy Patient Advocate Encounter  Received notification from [facility] [employer] that Prior Authorization for Ozempic (1 MG/DOSE) 4MG /3ML pen-injectors has been APPROVED from 02/18/2024 to 02/17/2025. Ran test claim, Copay is $47.00. This test claim was processed through [facility]- copay amounts may vary at other pharmacies due to pharmacy/plan contracts, or as the patient moves through the different stages of their insurance plan.   PA #/Case ID/Reference #: [id number] Key: [id number]

## 2024-02-25 ENCOUNTER — Ambulatory Visit (INDEPENDENT_AMBULATORY_CARE_PROVIDER_SITE_OTHER): Payer: PPO

## 2024-02-25 DIAGNOSIS — I255 Ischemic cardiomyopathy: Secondary | ICD-10-CM

## 2024-02-26 ENCOUNTER — Encounter: Payer: PPO | Admitting: Physician Assistant

## 2024-02-26 LAB — CUP PACEART REMOTE DEVICE CHECK
Battery Remaining Longevity: 168 mo
Battery Remaining Percentage: 100 %
Brady Statistic RV Percent Paced: 0 %
Date Time Interrogation Session: 20250408033800
HighPow Impedance: 62 Ohm
Implantable Lead Connection Status: 753985
Implantable Lead Implant Date: 20250106
Implantable Lead Location: 753860
Implantable Lead Model: 137
Implantable Lead Serial Number: 301618
Implantable Pulse Generator Implant Date: 20250106
Lead Channel Impedance Value: 485 Ohm
Lead Channel Pacing Threshold Amplitude: 0.6 V
Lead Channel Pacing Threshold Pulse Width: 0.4 ms
Lead Channel Setting Pacing Amplitude: 3.5 V
Lead Channel Setting Pacing Pulse Width: 0.4 ms
Lead Channel Setting Sensing Sensitivity: 0.5 mV
Pulse Gen Serial Number: 222540

## 2024-02-27 ENCOUNTER — Ambulatory Visit: Attending: Pulmonary Disease | Admitting: Pulmonary Disease

## 2024-02-27 NOTE — Progress Notes (Deleted)
  Electrophysiology Office Note:   Date:  02/27/2024  ID:  Julie Hernandez, DOB 1971/02/01, MRN [id number]  Primary Cardiologist: Arvilla Meres, MD Primary Heart Failure: None Electrophysiologist: Maurice Small, MD   {Click to update primary MD,subspecialty MD or APP then REFRESH:1}    History of Present Illness:   Julie Hernandez is a 53 y.o. female with h/o HFrEF s/p ICD, PVC's with palpitations seen today for routine electrophysiology follow up post ICD insert in 11/2023.   Since last being seen in our clinic the patient reports doing ***.  she denies chest pain, palpitations, dyspnea, PND, orthopnea, nausea, vomiting, dizziness, syncope, edema, weight gain, or early satiety.   Review of systems complete and found to be negative unless listed in HPI.   EP Information / Studies Reviewed:    EKG is not ordered today. EKG from 12/23/2023 reviewed which showed NSR 83 bpm      ICD Interrogation-  reviewed in detail today,  See PACEART report.  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  {Refresh Note OR Click here to enter BP  :1}***        Physical Exam:   VS:  There were no vitals taken for this visit.   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 [employer] single chamber ICD  Hx NSTEMI NYHA II  -euvolemic today -Stable on an appropriate medical regimen -Normal ICD function -See Pace Art report -No changes today -continue Metoprolol, Spironolactone, empagliflozin  Frequent PVC's  Palpitations 8% on cardiac monitor in 01/2023  -may require ablation vs AAD   Hypertension  -well controlled on current regimen ***  CAD  HLD -Plavix, ASA, Zetia per primary Cardiology  -Statin allergy  Disposition:   Follow up with {EPPROVIDERS:28135} {EPFOLLOW MV:78469}   Signed, Canary Brim, NP-C, AGACNP-BC [facility] - Electrophysiology  02/27/2024, 7:24 AM

## 2024-02-28 ENCOUNTER — Encounter: Payer: Self-pay | Admitting: Pulmonary Disease

## 2024-03-03 ENCOUNTER — Encounter: Payer: Self-pay | Admitting: Internal Medicine

## 2024-03-10 ENCOUNTER — Ambulatory Visit: Attending: Internal Medicine | Admitting: Internal Medicine

## 2024-03-10 ENCOUNTER — Encounter: Payer: Self-pay | Admitting: Internal Medicine

## 2024-03-10 VITALS — BP 102/64 | HR 90 | Ht 62.0 in | Wt 162.0 lb

## 2024-03-10 DIAGNOSIS — I5022 Chronic systolic (congestive) heart failure: Secondary | ICD-10-CM | POA: Diagnosis not present

## 2024-03-10 LAB — CUP PACEART INCLINIC DEVICE CHECK
Date Time Interrogation Session: 20250422123431
Implantable Lead Connection Status: 753985
Implantable Lead Implant Date: 20250106
Implantable Lead Location: 753860
Implantable Lead Model: 137
Implantable Lead Serial Number: 301618
Implantable Pulse Generator Implant Date: 20250106
Pulse Gen Serial Number: 222540

## 2024-03-10 NOTE — Progress Notes (Signed)
 HPI Ms. Shelton returns for followup s/p ICD insertion. The patient is a pleasant 53 yo woman with an ICM, EF 25% who is s/p MI. She has class 2 CHF symptoms under the direction of Dr. Veto Gowda. She has not had syncope. She has a narrow QRS. She has been stable since his ICD insertion.  Allergies  Allergen Reactions   Esomeprazole Magnesium Anaphylaxis   Ciprofibrate Itching   Ciprofloxacin Itching   Gabapentin Other (See Comments)    Achy - myalgia   Lubiprostone Diarrhea   Statins     Myalgia - flu like symptoms   Amlodipine Rash   Iron Rash and Swelling   Latex Rash and Swelling   Povidone Iodine Rash     Current Outpatient Medications  Medication Sig Dispense Refill   albuterol  (VENTOLIN  HFA) 108 (90 Base) MCG/ACT inhaler Inhale 2 puffs into the lungs every 4 (four) hours as needed for wheezing or shortness of breath.     ALPRAZolam (XANAX) 0.5 MG tablet Take 0.5 mg by mouth.     aspirin  EC 81 MG tablet Take 1 tablet (81 mg total) by mouth daily. 30 tablet 6   Aspirin -Acetaminophen -Caffeine (GOODY HEADACHE PO) Take 1 packet by mouth daily as needed (headaches).     blood glucose meter kit and supplies Dispense based on patient and insurance preference. Use up to four times daily as directed. (FOR ICD-10 E10.9, E11.9). 1 each 1   clopidogrel  (PLAVIX ) 75 MG tablet Take 1 tablet (75 mg total) by mouth daily. 30 tablet 11   Continuous Glucose Sensor (FREESTYLE LIBRE 3 PLUS SENSOR) MISC Use to monitor glucose continuously as directed. Change sensor every 15 days 6 each 3   escitalopram  (LEXAPRO ) 10 MG tablet Take 10 mg by mouth every evening.     Evolocumab  (REPATHA  SURECLICK) 140 MG/ML SOAJ Inject 140 mg into the skin every 14 (fourteen) days. 6 mL 3   ezetimibe  (ZETIA ) 10 MG tablet Take 1 tablet (10 mg total) by mouth daily at 6pm. 30 tablet 11   famotidine  (PEPCID ) 40 MG tablet Take 40 mg by mouth daily as needed for heartburn.     furosemide  (LASIX ) 40 MG tablet Take 2 tablets  (80 mg total) by mouth daily. 60 tablet 5   Insulin  Pen Needle 32G X 4 MM MISC Use with insulin  pen 100 each 0   JARDIANCE  10 MG TABS tablet Take 1 tablet (10 mg total) by mouth daily before breakfast. 90 tablet 3   metFORMIN  (GLUCOPHAGE ) 500 MG tablet Take 2 tablets (1,000 mg total) by mouth 2 (two) times daily with a meal. 360 tablet 3   metoprolol  succinate (TOPROL  XL) 25 MG 24 hr tablet Take 1 tablet (25 mg total) by mouth daily. 30 tablet 11   mexiletine (MEXITIL) 200 MG capsule Take 1 capsule (200 mg total) by mouth 2 (two) times daily. 60 capsule 11   nitroGLYCERIN  (NITROSTAT ) 0.4 MG SL tablet PLACE 1 TABLET UNDER THE TONGUE AND ALLOW TO DISSOLVE SLOWLY, DO NOT CHEW OR SWALLOW. YOU MAY USE ADDITIONAL TABLETS EVERY 5 MINUTES BUT NO MORE THAN 3 TABLETS. 25 tablet 1   potassium chloride  SA (KLOR-CON  M) 20 MEQ tablet Take 2 tablets (40 mEq total) by mouth daily. 60 tablet 6   promethazine (PHENERGAN) 25 MG tablet promethazine 25 mg tablet  TAKE ONE TABLET BY MOUTH EVERY 6 HOURS AS NEEDED     RABEprazole (ACIPHEX) 20 MG tablet Take 20 mg by mouth 2 (two)  times daily.     Semaglutide , 1 MG/DOSE, 4 MG/3ML SOPN Inject 1 mg as directed once a week. 9 mL 1   spironolactone  (ALDACTONE ) 25 MG tablet Take 1 tablet (25 mg total) by mouth daily. 30 tablet 11   No current facility-administered medications for this visit.     Past Medical History:  Diagnosis Date   CHF (congestive heart failure) (HCC)    Coronary artery disease    COVID 12/03/2020   Diabetes mellitus without complication (HCC)    Hypertension    OSA on CPAP    moderate obstructive sleep apnea with an AHI of 20.6/h and he was placed on auto CPAP from 4 to 15 cm H2O    ROS:   All systems reviewed and negative except as noted in the HPI.   Past Surgical History:  Procedure Laterality Date   CORONARY BALLOON ANGIOPLASTY N/A 04/07/2020   Procedure: CORONARY BALLOON ANGIOPLASTY;  Surgeon: Arnoldo Lapping, MD;  Location: [facility]  INVASIVE CV LAB;  Service: Cardiovascular;  Laterality: N/A;   CORONARY PRESSURE/FFR STUDY N/A 04/07/2020   Procedure: INTRAVASCULAR PRESSURE WIRE/FFR STUDY;  Surgeon: Arnoldo Lapping, MD;  Location: [facility] INVASIVE CV LAB;  Service: Cardiovascular;  Laterality: N/A;   CORONARY STENT INTERVENTION N/A 04/07/2020   Procedure: CORONARY STENT INTERVENTION;  Surgeon: Arnoldo Lapping, MD;  Location: [facility] INVASIVE CV LAB;  Service: Cardiovascular;  Laterality: N/A;   CORONARY STENT INTERVENTION N/A 09/18/2022   Procedure: CORONARY STENT INTERVENTION;  Surgeon: Arnoldo Lapping, MD;  Location: [facility] INVASIVE CV LAB;  Service: Cardiovascular;  Laterality: N/A;   CORONARY ULTRASOUND/IVUS N/A 04/07/2020   Procedure: Intravascular Ultrasound/IVUS;  Surgeon: Arnoldo Lapping, MD;  Location: [facility] INVASIVE CV LAB;  Service: Cardiovascular;  Laterality: N/A;   ICD IMPLANT N/A 11/25/2023   Procedure: ICD IMPLANT;  Surgeon: Tammie Fall, MD;  Location: [facility] INVASIVE CV LAB;  Service: Cardiovascular;  Laterality: N/A;   LEFT HEART CATH AND CORONARY ANGIOGRAPHY N/A 04/07/2020   Procedure: LEFT HEART CATH AND CORONARY ANGIOGRAPHY;  Surgeon: Arnoldo Lapping, MD;  Location: [facility] INVASIVE CV LAB;  Service: Cardiovascular;  Laterality: N/A;   LEFT HEART CATH AND CORONARY ANGIOGRAPHY N/A 04/14/2021   Procedure: LEFT HEART CATH AND CORONARY ANGIOGRAPHY;  Surgeon: Mardell Shade, MD;  Location: [facility] INVASIVE CV LAB;  Service: Cardiovascular;  Laterality: N/A;   LEFT HEART CATH AND CORONARY ANGIOGRAPHY N/A 11/18/2023   Procedure: LEFT HEART CATH AND CORONARY ANGIOGRAPHY;  Surgeon: Arnoldo Lapping, MD;  Location: [facility] INVASIVE CV LAB;  Service: Cardiovascular;  Laterality: N/A;   RIGHT/LEFT HEART CATH AND CORONARY ANGIOGRAPHY N/A 09/17/2022   Procedure: RIGHT/LEFT HEART CATH AND CORONARY ANGIOGRAPHY;  Surgeon: Sammy Crisp, MD;  Location: [facility] INVASIVE CV LAB;  Service: Cardiovascular;  Laterality: N/A;     Family History  Problem Relation  Age of Onset   Diabetes Mother    Hypertension Mother    Heart disease Mother    Hyperlipidemia Mother      Social History   Socioeconomic History   Marital status: Divorced    Spouse name: Not on file   Number of children: 2   Years of education: Not on file   Highest education level: Not on file  Occupational History   Occupation: On disability  Tobacco Use   Smoking status: Former    Current packs/day: 0.00    Types: Cigarettes    Quit date: 02/18/2020    Years since quitting: 4.0   Smokeless tobacco: Never  Vaping Use  Vaping status: Some Days  Substance and Sexual Activity   Alcohol  use: Yes    Comment: SOCIAL   Drug use: Never   Sexual activity: Not on file  Other Topics Concern   Not on file  Social History Narrative   Not on file   Social Drivers of Health   Financial Resource Strain: Not on file  Food Insecurity: Not on file  Transportation Needs: Not on file  Physical Activity: Not on file  Stress: Not on file  Social Connections: Not on file  Intimate Partner Violence: Not on file     BP 102/64   Pulse 90   Ht 5\' 2"  (1.575 m)   Wt 162 lb (73.5 kg)   SpO2 97%   BMI 29.63 kg/m   Physical Exam:  Well appearing NAD HEENT: Unremarkable Neck:  No JVD, no thyromegally Lymphatics:  No adenopathy Back:  No CVA tenderness Lungs:  Clear with no wheezes HEART:  Regular rate rhythm, no murmurs, no rubs, no clicks Abd:  soft, positive bowel sounds, no organomegally, no rebound, no guarding Ext:  2 plus pulses, no edema, no cyanosis, no clubbing Skin:  No rashes no nodules Neuro:  CN II through XII intact, motor grossly intact  EKG - nsr  DEVICE  Normal device function.  See PaceArt for details.   Assess/Plan: ICM - she denies anginal symptoms. She has class 2 dyspnea. Continue GDMT.  ICD - her [employer] [employer] ICD is working normally. No change.   Pete Brand Evvie Behrmann,MD

## 2024-03-10 NOTE — Patient Instructions (Signed)
 Medication Instructions:  Your physician recommends that you continue on your current medications as directed. Please refer to the Current Medication list given to you today.  *If you need a refill on your cardiac medications before your next appointment, please call your pharmacy*  Lab Work: None ordered.  You may go to any [facility] Location for your lab work:  [facility] - [address] ([facility]) - [address] [phone] [address]  [place] - [address]   [place]  - [address]   [place] - [address] - [address] [employer]  - [address] [place] - [address] ([facility]   If you have labs (blood work) drawn today and your tests are completely normal, you will receive your results only by: [employer] (if you have MyChart)  If you have any lab test that is abnormal or we need to change your treatment, we will call you or send a MyChart message to review the results.  Testing/Procedures: None ordered.  Follow-Up: At [facility], you and your health needs are our priority.  As part of our continuing mission to provide you with exceptional heart care, we have created designated Provider Care Teams.  These Care Teams include your primary Cardiologist (physician) and Advanced Practice Providers (APPs -  Physician Assistants and Nurse Practitioners) who all work together to provide you with the care you need, when you need it.  Your next appointment:   1 year(s)  The format for your next appointment:   In Person  Provider:   Lewayne Bunting, MD{or one of the following Advanced Practice Providers on your designated Care Team:   Francis Dowse, [place] Casimiro Needle "Mardelle Matte" [place], [place] Earnest Rosier, NP  Note: Remote monitoring is used to monitor your Pacemaker/ ICD from home. This monitoring reduces the number of office visits required to check your  device to one time per year. It allows [place] to keep an eye on the functioning of your device to ensure it is working properly.            Valet parking services will be available as well.

## 2024-03-13 NOTE — Telephone Encounter (Signed)
 Instruction letter has been discarded since patient hasn't picked it from our office.

## 2024-03-30 ENCOUNTER — Telehealth (HOSPITAL_COMMUNITY): Payer: Self-pay

## 2024-03-30 ENCOUNTER — Telehealth (HOSPITAL_COMMUNITY): Payer: Self-pay | Admitting: Cardiology

## 2024-03-30 NOTE — Telephone Encounter (Signed)
 Called to confirm/remind patient of their appointment at the [facility] on 03/31/2024 1:30 .   Appointment:   [x] Confirmed  [] Left mess   [] No answer/No voice mail  [] VM Full/unable to leave message  [] Phone not in service  Patient reminded to bring all medications and/or complete list.  Confirmed patient has transportation. Gave directions, instructed to utilize valet parking.

## 2024-03-30 NOTE — Progress Notes (Incomplete)
 [facility] Note  Date:  03/31/24  ID:  Julie Hernandez, DOB [birth year], MRN [id number]  Location: Home  Type of Visit: Established patient  PCP:  Julie Beady, FNP  EP: Dr. Carolynne Hernandez HF Cardiologist: Dr. Julane Hernandez  Chief Complaint: Heart Failure follow-up   HPI: Julie Hernandez is a 53 y.o. woman with COPD/ongoing tobacco abuse (vaping), premature CAD s/p anterior MI, diet-controlled DM2, chronic back pain, and systolic HF due to [facility] referred for further evaluation of CAD and systolic HF.  Had anterior MI in 2010. LAD stent at [facility]. Went to [facility] that same year and had  a second stent placed. Did well from a cardiac perspective until 3/21.   Cath 02/08/20 at [facility] for NSTEMI. 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 Was told she may need repeat stenting vs CABG. Referred here for second opinion.   We saw her in 5/21 with daily angina. Underwent MRI which showed EF 26% only minimal viability in anterior wall. Decision to proceed with PCI of LCX and LAD (ISR) over CABG. Had successful PCI of LCX and LAD with Dr. Arlester Hernandez on 5/20.   Echo 9/21 EF 60-65%.  Echo 04/06/21 EF 50-55% mild anterior and apical HK  Admitted 10/23 with NSTEMI. Echo EF back down to 20% and RV mildly reduced. Underwent R/LHC which showed elevated right and left filling pressures and low CO;  occluded LAD stent and new CTO of RCA with L>>R collaterals. Underwent staged PCI with DES to mid LAD.   Seen in ED 12/25/22 with SVT  Echo 01/11/23 EF 25-30% with WMA c/w previous LAD infarct Sleep  01/08/23 Moderate OSA 20.6 CSA 1.6  Zio 2/24 50 runs NSVT (longest 11 beats) 8.5% PVCs  cMRI 8/24 showed LVEF 26%, RVEF 39%, large anterior infarct--> referred to EP for ICD.  Zio 12/24:  Mostly NSR, but 25 runs of NSVT and 11.8% PVC burden. Started on Mexilitine  S/p ICD  insertion 1/25.   Today she returns for AHF follow up. Overall not feeling too good. Has been feeling poor for the last 2 weeks. Has little energy. Denies palpitations, or PND/Orthopnea. Noticed swelling in her legs this morning. Has been having chest pressure over the last 2 weeks. Intt dizziness, resolves with rest. SOB with activity. Has a new wheeze. Appetite poor with abd fullness. No fever or chills. Has not been weighing at home. Taking all medications. Denies ETOH, tobacco or drug use. Still vaping. Has been constantly chewing ice.   Cardiac Studies - LHC 12/24: Patent left main with mild nonobstructive plaquing.  Patent LAD with moderate to severe focal in-stent restenosis in the distal stented segment, estimated at 70%. Widely patent circumflex stent. Totally occluded RCA with left-to-right collaterals, unchanged from the previous cath study.  - cMRI (8/24): showed LVEF 26%, RVEF 39%, large anterior infarct - R/LHC (10/23): RA 23 mean, PA 48/35 mean 39, PCWP mean 30, CO/CI (Fick) 3.7/2 Severe 2v CAD with 90% in stent re-stenonsis of mid LAD and CTO of mid RCA with L>>R collaterals - Echo (10/23): EF 20%, RV mildly down. - Echo (5/22): EF 50-55%, mid anterior and apical HK - Echo (9/21): EF 60-65% - Zio (7/21) 1. Sinus rhythm - avg HR of 82 2. 13 Ventricular Tachycardia runs occurred, the run with the fastest interval lasting 12 beats with  a max rate of 139 bpm (avg 110 bpm); the run with the fastest interval was also the longest 3. Very frequents PVCs (32.2%, [id number]), VE Couplets were rare (<1.0%, 2118),  4. Ventricular Bigeminy and Trigeminy were present. 5.. Multiple patient-triggered events associated - cMRI (03/29/20) 1. Subendocardial late gadolinium enhancement consistent with prior infarct in the LAD territory. There is >50% transmural LGE suggesting nonviability in the basal to apical anterior wall and apex. There is <50% transmural LGE suggesting viability in the basal to mid  anteroseptum and apical septum 2.  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 Normal LV size with severe systolic dysfunction (EF 26%). Akinesis of basal to apical anterior/anteroseptal walls and apex 4.  Small RV size with normal systolic function (EF 62%)  Past Medical History:  Diagnosis Date   CHF (congestive heart failure) (HCC)    Coronary artery disease    COVID 12/03/2020   Diabetes mellitus without complication (HCC)    Hypertension    OSA on CPAP    moderate obstructive sleep apnea with an AHI of 20.6/h and he was placed on auto CPAP from 4 to 15 cm H2O   Current Outpatient Medications  Medication Sig Dispense Refill   albuterol  (VENTOLIN  HFA) 108 (90 Base) MCG/ACT inhaler Inhale 2 puffs into the lungs every 4 (four) hours as needed for wheezing or shortness of breath.     ALPRAZolam (XANAX) 0.5 MG tablet Take 0.5 mg by mouth.     aspirin  EC 81 MG tablet Take 1 tablet (81 mg total) by mouth daily. 30 tablet 6   Aspirin -Acetaminophen -Caffeine (GOODY HEADACHE PO) Take 1 packet by mouth daily as needed (headaches).     blood glucose meter kit and supplies Dispense based on patient and insurance preference. Use up to four times daily as directed. (FOR ICD-10 E10.9, E11.9). 1 each 1   clopidogrel  (PLAVIX ) 75 MG tablet Take 1 tablet (75 mg total) by mouth daily. 30 tablet 11   Continuous Glucose Sensor (FREESTYLE LIBRE 3 PLUS SENSOR) MISC Use to monitor glucose continuously as directed. Change sensor every 15 days 6 each 3   escitalopram  (LEXAPRO ) 10 MG tablet Take 10 mg by mouth every evening.     Evolocumab  (REPATHA  SURECLICK) 140 MG/ML SOAJ Inject 140 mg into the skin every 14 (fourteen) days. 6 mL 3   ezetimibe  (ZETIA ) 10 MG tablet Take 1 tablet (10 mg total) by mouth daily at 6pm. 30 tablet 11   famotidine  (PEPCID ) 40 MG tablet Take 40 mg by mouth daily as needed for heartburn.     furosemide  (LASIX ) 40 MG tablet Take 2 tablets (80 mg total) by mouth daily. 60 tablet 5   Insulin   Pen Needle 32G X 4 MM MISC Use with insulin  pen 100 each 0   JARDIANCE  10 MG TABS tablet Take 1 tablet (10 mg total) by mouth daily before breakfast. 90 tablet 3   metFORMIN  (GLUCOPHAGE ) 500 MG tablet Take 2 tablets (1,000 mg total) by mouth 2 (two) times daily with a meal. 360 tablet 3   metoprolol  succinate (TOPROL  XL) 25 MG 24 hr tablet Take 1 tablet (25 mg total) by mouth daily. 30 tablet 11   mexiletine (MEXITIL) 200 MG capsule Take 1 capsule (200 mg total) by mouth 2 (two) times daily. 60 capsule 11   nitroGLYCERIN  (NITROSTAT ) 0.4 MG SL tablet PLACE 1 TABLET UNDER THE TONGUE AND ALLOW TO DISSOLVE SLOWLY, DO NOT CHEW OR SWALLOW. YOU MAY USE ADDITIONAL TABLETS EVERY 5  MINUTES BUT NO MORE THAN 3 TABLETS. 25 tablet 1   potassium chloride  SA (KLOR-CON  M) 20 MEQ tablet Take 2 tablets (40 mEq total) by mouth daily. 60 tablet 6   promethazine (PHENERGAN) 25 MG tablet promethazine 25 mg tablet  TAKE ONE TABLET BY MOUTH EVERY 6 HOURS AS NEEDED     RABEprazole (ACIPHEX) 20 MG tablet Take 20 mg by mouth 2 (two) times daily.     Semaglutide , 1 MG/DOSE, 4 MG/3ML SOPN Inject 1 mg as directed once a week. (Patient taking differently: Inject 1 mg as directed once a week. Takes on Mondays) 9 mL 1   spironolactone  (ALDACTONE ) 25 MG tablet Take 1 tablet (25 mg total) by mouth daily. 30 tablet 11   No current facility-administered medications for this encounter.   Allergies  Allergen Reactions   Esomeprazole Magnesium Anaphylaxis   Ciprofibrate Itching   Ciprofloxacin Itching   Gabapentin Other (See Comments)    Achy - myalgia   Lubiprostone Diarrhea   Statins     Myalgia - flu like symptoms   Amlodipine Rash   Iron Rash and Swelling   Latex Rash and Swelling   Povidone Iodine Rash   Social History   Socioeconomic History   Marital status: Divorced    Spouse name: Not on file   Number of children: 2   Years of education: Not on file   Highest education level: Not on file  Occupational History    Occupation: On disability  Tobacco Use   Smoking status: Former    Current packs/day: 0.00    Types: Cigarettes    Quit date: 02/18/2020    Years since quitting: 4.1   Smokeless tobacco: Never  Vaping Use   Vaping status: Some Days  Substance and Sexual Activity   Alcohol  use: Yes    Comment: SOCIAL   Drug use: Never   Sexual activity: Not on file  Other Topics Concern   Not on file  Social History Narrative   Not on file   Social Drivers of Health   Financial Resource Strain: Not on file  Food Insecurity: Not on file  Transportation Needs: Not on file  Physical Activity: Not on file  Stress: Not on file  Social Connections: Not on file  Intimate Partner Violence: Not on file   FHx: M died at 52 from MI and HF D had stroke No brother and sisters  BP 106/80   Pulse 92   Wt 75.2 kg (165 lb 12.8 oz)   SpO2 98%   BMI 30.33 kg/m   Wt Readings from Last 3 Encounters:  03/31/24 75.2 kg (165 lb 12.8 oz)  03/10/24 73.5 kg (162 lb)  02/17/24 71.7 kg (158 lb)   PHYSICAL EXAM General:  well appearing.  No respiratory difficulty. Walked into clinic Neck: supple. JVD ~14 cm.  Cor: PMI nondisplaced. Regular rate & rhythm. No rubs, gallops or murmurs. Lungs: clear Extremities: no cyanosis, clubbing, rash, +1 BLE edema  Neuro: alert & oriented x 3. Moves all 4 extremities w/o difficulty. Affect pleasant.   ECG NSR with PVCs (Personally reviewed)    ICD interrogation: heartlogic elevated at 37, active 1.4 hr/day, no therapies   ReDs reading: 41 %, abnormal   ASSESSMENT & PLAN: 1. CAD - prior anterior MI w/ h/o PCI to mLAD and mLCX - Echo w/ drop in EF, from 55% >> 20%, RV mildly reduced - Cath (10/23): 90% in-stent restenosis of mLAD and CTO mRCA with left-to-right collaterals. LCx  stent widely patent. -> S/P PCI DES mid LAD. - LHC 12/24: Patent left main with mild nonobstructive plaquing.  Patent LAD with moderate to severe focal in-stent restenosis in the distal  stented segment, estimated at 70%. Widely patent circumflex stent. Totally occluded RCA with left-to-right collaterals, unchanged from the previous cath study. Plan for medical therapy for now.  - Minimal CP, more of a pressure with volume overload.  - Continue DAPT. - Lipids managed by Lipid Clinic. LDL 90 2/25  2. Chronic Systolic Heart Failure - Previous history of ICM following prior anterior MI.  - EF previously 25-30%, improved post LAD and LCx PCI - Echo (9/21): EF 60-65% - Echo (04/06/21): EF 50-55% mild anterior and apical HK - Echo (10/23): EF down 20%, RV mildly reduced in setting of LAD and RCA infarcts, previously placed mLAD stent w/ 99% stenosis, CTO mRCA w/ L>R collaterals  - RHC (10/23):  w/ elevated R+ L filling pressures and low output, RA 23, PCWP 30, CI 2.0  - Echo 01/11/23 EF 25-30% with WMA c/w previous LAD infarct - cMRI (8/24): LVEF 26%, RVEF 39%, large anterior infarct - NYHA IIIa, appears volume overloaded on exam and by device interrogation and ReDS.  - Stop lasix . Start Torsemide 60 mg daily + 40 mEq KDUR. Will do 2.5 metolazone today x1 + additional 40 mEq KDUR. Labs today, repeat BMET in 1 week.  - Continue Toprol  XL 25 mg daily. - Off Entresto  24/26 mg bid due to low BP. BP too soft to add low-dose losartan  today. Reassess post diuresis.  - Continue spironolactone  25 mg daily.   - Continue Jardiance /Ozempic  - Now s/p DDD ICD with Dr. Carolynne Hernandez 11/25/23. - Labs today.  3. SVT/PVCs - Zio (05/2020) showed mostly SR, frequent PVCs (32% burden), 13 runs of VT - Zio 2/24 50 runs NSVT (longest 11 beats) 8.5% PVCs - EF previously improved w/o PVC suppression, so unlikely contributing to CM - Would like to avoid amio with young age and vaping - Zio 12/24:  Mostly NSR, but 25 runs of NSVT and 11.8% PVC burden. Started on Mexilitine - Continue mexiletine 200 mg BID - EKG today with a PVC. Stable.  - Continue CPAP, tries to use it daily  4. DM2 - improving control - Hgb  A1c 13.1 (11/23)--> 8.5 (1/24)-> 6.2 (11/24) - Followed by PCP/ENDO - Continue SGLT2i and Ozempic    5. HL w/ LDL Goal < 55 - Statin intolerant. - Continue Repatha  + Zetia  - Followed by Lipid Clinic   6. H/o Tobacco Use - Quit cigarettes, currently vapes  - Discussed need for cessation  7. Obesity - Body mass index is 30.33 kg/m. - Continue Ozempic   8. OSA - Sleep study 01/08/23 Moderate OSA 20.6 CSA 1.6 - Continue CPAP  - Follows with Dr. Micael Adas  9. Carotid bruit - Carotid u/s with no significant dz  Follow up in 2 weeks with APP to reassess volume status.   Sheryl Donna, NP  2:09 PM

## 2024-03-30 NOTE — Telephone Encounter (Signed)
 Patient called with increased concern for chest tightness (pressure) SOB and fatigue  Reports diuretic was recently increased to release pressure/fluid on chest however symptoms have returned over the past 5-7 days  Denies chest pains just pressure Reports weight is stable  Mild edema   Requests an appt to discuss 5/13 @ 130

## 2024-03-31 ENCOUNTER — Ambulatory Visit (HOSPITAL_COMMUNITY)
Admission: RE | Admit: 2024-03-31 | Discharge: 2024-03-31 | Disposition: A | Discharge status: Home / Self Care | Source: Ambulatory Visit | Attending: Internal Medicine | Admitting: Internal Medicine

## 2024-03-31 ENCOUNTER — Ambulatory Visit (HOSPITAL_COMMUNITY): Payer: Self-pay | Admitting: Internal Medicine

## 2024-03-31 ENCOUNTER — Other Ambulatory Visit (HOSPITAL_COMMUNITY): Payer: Self-pay

## 2024-03-31 ENCOUNTER — Encounter (HOSPITAL_COMMUNITY): Payer: Self-pay

## 2024-03-31 VITALS — BP 106/80 | HR 92 | Wt 165.8 lb

## 2024-03-31 DIAGNOSIS — Z955 Presence of coronary angioplasty implant and graft: Secondary | ICD-10-CM | POA: Diagnosis not present

## 2024-03-31 DIAGNOSIS — I471 Supraventricular tachycardia, unspecified: Secondary | ICD-10-CM | POA: Diagnosis not present

## 2024-03-31 DIAGNOSIS — J449 Chronic obstructive pulmonary disease, unspecified: Secondary | ICD-10-CM | POA: Insufficient documentation

## 2024-03-31 DIAGNOSIS — I493 Ventricular premature depolarization: Secondary | ICD-10-CM | POA: Diagnosis not present

## 2024-03-31 DIAGNOSIS — I252 Old myocardial infarction: Secondary | ICD-10-CM | POA: Insufficient documentation

## 2024-03-31 DIAGNOSIS — Z79899 Other long term (current) drug therapy: Secondary | ICD-10-CM | POA: Diagnosis not present

## 2024-03-31 DIAGNOSIS — E119 Type 2 diabetes mellitus without complications: Secondary | ICD-10-CM | POA: Diagnosis not present

## 2024-03-31 DIAGNOSIS — I5022 Chronic systolic (congestive) heart failure: Secondary | ICD-10-CM

## 2024-03-31 DIAGNOSIS — E669 Obesity, unspecified: Secondary | ICD-10-CM

## 2024-03-31 DIAGNOSIS — E118 Type 2 diabetes mellitus with unspecified complications: Secondary | ICD-10-CM

## 2024-03-31 DIAGNOSIS — E785 Hyperlipidemia, unspecified: Secondary | ICD-10-CM | POA: Diagnosis not present

## 2024-03-31 DIAGNOSIS — F17290 Nicotine dependence, other tobacco product, uncomplicated: Secondary | ICD-10-CM | POA: Diagnosis not present

## 2024-03-31 DIAGNOSIS — R0989 Other specified symptoms and signs involving the circulatory and respiratory systems: Secondary | ICD-10-CM | POA: Diagnosis not present

## 2024-03-31 DIAGNOSIS — G4733 Obstructive sleep apnea (adult) (pediatric): Secondary | ICD-10-CM | POA: Insufficient documentation

## 2024-03-31 DIAGNOSIS — I2510 Atherosclerotic heart disease of native coronary artery without angina pectoris: Secondary | ICD-10-CM | POA: Diagnosis not present

## 2024-03-31 DIAGNOSIS — E782 Mixed hyperlipidemia: Secondary | ICD-10-CM

## 2024-03-31 DIAGNOSIS — I10 Essential (primary) hypertension: Secondary | ICD-10-CM

## 2024-03-31 DIAGNOSIS — Z7985 Long-term (current) use of injectable non-insulin antidiabetic drugs: Secondary | ICD-10-CM | POA: Insufficient documentation

## 2024-03-31 DIAGNOSIS — Z87891 Personal history of nicotine dependence: Secondary | ICD-10-CM

## 2024-03-31 DIAGNOSIS — Z6830 Body mass index (BMI) 30.0-30.9, adult: Secondary | ICD-10-CM | POA: Diagnosis not present

## 2024-03-31 DIAGNOSIS — Z7984 Long term (current) use of oral hypoglycemic drugs: Secondary | ICD-10-CM | POA: Diagnosis not present

## 2024-03-31 LAB — BASIC METABOLIC PANEL WITH GFR
Anion gap: 14 (ref 5–15)
BUN: 19 mg/dL (ref 6–20)
CO2: 25 mmol/L (ref 22–32)
Calcium: 9.4 mg/dL (ref 8.9–10.3)
Chloride: 104 mmol/L (ref 98–111)
Creatinine, Ser: 1.16 mg/dL — ABNORMAL HIGH (ref 0.44–1.00)
GFR, Estimated: 56 mL/min — ABNORMAL LOW (ref >60)
Glucose, Bld: 118 mg/dL — ABNORMAL HIGH (ref 70–99)
Potassium: 4.6 mmol/L (ref 3.5–5.1)
Sodium: 143 mmol/L (ref 135–145)

## 2024-03-31 LAB — BRAIN NATRIURETIC PEPTIDE: B Natriuretic Peptide: 971.8 pg/mL — ABNORMAL HIGH (ref 0.0–100.0)

## 2024-03-31 LAB — FERRITIN: Ferritin: 9 ng/mL — ABNORMAL LOW (ref 11–307)

## 2024-03-31 LAB — IRON AND TIBC
Iron: 28 ug/dL (ref 28–170)
Saturation Ratios: 5 % — ABNORMAL LOW (ref 10.4–31.8)
TIBC: 518 ug/dL — ABNORMAL HIGH (ref 250–450)
UIBC: 490 ug/dL

## 2024-03-31 MED ORDER — TORSEMIDE 20 MG PO TABS: 60.0000 mg | ORAL_TABLET | Freq: Every day | ORAL | 3 refills | Status: DC

## 2024-03-31 MED ORDER — METOLAZONE 2.5 MG PO TABS: 2.5000 mg | ORAL_TABLET | ORAL | 0 refills | Status: AC

## 2024-03-31 NOTE — Patient Instructions (Signed)
 STOP Lasix .  START Torsemide 60 mg daily.  TAKE 2.5 mg of Metolazone today only with an extra 40 mEq of Potassium ( 2 tabs)   Labs done today, your results will be available in MyChart, we will contact you for abnormal readings.  Repeat blood work in 7 days.  Your physician recommends that you schedule a follow-up appointment in: as scheduled.  If you have any questions or concerns before your next appointment please send us  a message through [place] or call our office at [phone].    TO LEAVE A MESSAGE FOR THE NURSE SELECT OPTION 2, PLEASE LEAVE A MESSAGE INCLUDING: YOUR NAME DATE OF BIRTH CALL BACK NUMBER REASON FOR CALL**this is important as we prioritize the call backs  YOU WILL RECEIVE A CALL BACK THE SAME DAY AS LONG AS YOU CALL BEFORE 4:00 PM  At the [facility], you and your health needs are our priority. As part of our continuing mission to provide you with exceptional heart care, we have created designated Provider Care Teams. These Care Teams include your primary Cardiologist (physician) and Advanced Practice Providers (APPs- Physician Assistants and Nurse Practitioners) who all work together to provide you with the care you need, when you need it.   You may see any of the following providers on your designated Care Team at your next follow up: Dr Jules Oar Dr Peder Bourdon Dr. Alwin Baars Dr. Arta Lark Amy Marijane Shoulders, NP Ruddy Corral, [name] [facility] [place], [name] Dennise Fitz, NP [place] Lee, NP Shawnee Dellen, NP Luster Salters, [name] Bevely Brush, [name]   Please be sure to bring in all your medications bottles to every appointment.    Thank you for choosing [facility]

## 2024-03-31 NOTE — Progress Notes (Signed)
 I cannot find in her chart much information about the reaction. All I see documented is "Iron Analogues Rash and Swelling" even in Care Everywhere. I cannot find the product she would have been administered or any information about the reaction to where I would feel safe recommending a different formulation. I am also not aware of an alternative option that would not include iron. She may have tolerated a different formulation, but as above, I cannot find any documentation about this so unlikely to have been given at our institution.

## 2024-03-31 NOTE — Progress Notes (Signed)
 ReDS Vest / Clip - 03/31/24 1400       ReDS Vest / Clip   Station Marker A    Ruler Value 34.5    ReDS Value Range High volume overload    ReDS Actual Value 41

## 2024-03-31 NOTE — Addendum Note (Signed)
 Encounter addended by: Dewey Fordyce, CMA on: 03/31/2024 2:36 PM  Actions taken: Order list changed, Diagnosis association updated, Flowsheet accepted, Clinical Note Signed

## 2024-03-31 NOTE — Progress Notes (Signed)
 error

## 2024-04-01 NOTE — Telephone Encounter (Signed)
 Pt aware, agreeable, and verbalized understanding  Called patient about lab result, she informed me that she had an allergy to iv iron. Informed Dennise Fitz provider and she consulted Luster Salters Pharmacist. Consensus for now is to hold off on an Iv iron replacement.

## 2024-04-03 ENCOUNTER — Telehealth (HOSPITAL_COMMUNITY): Payer: Self-pay | Admitting: Cardiology

## 2024-04-03 NOTE — Telephone Encounter (Signed)
 Pt aware and voiced understanding

## 2024-04-03 NOTE — Telephone Encounter (Signed)
 Pt seen in clinic 5/13 Diuretics changed  (d/c'd lasix  started torsemide 60, metolzone x 1)   Reports increase in lag cramps 5/13 pm 5/14 metolazone and torsemide taken reports leg cramps and fatigue all day  5/15 leg cramps/feet craps increased  chest tightness/palps at rest relieved with NTG x1 HR 166 after 4 aspirin  HR 100  5/16 report no chest pain/palps Denies leg cramps however legs feel as if they could cramp at any moment However she feel SOB and face is puffy (NO TORSEMIDE THIS AM)   K 03/31/24 4.6 -pt reports compliance with 40 KCL daily and scheduled for repeat labs 5/22   PT WOULD LIKE TO KNOW IF SHE SHOULD TAKE FULL DOSE OF DIURETIC? ARE ADDITIONAL CHANGES NEEDED WITH SYMPTOMS ABOVE

## 2024-04-07 ENCOUNTER — Ambulatory Visit (HOSPITAL_COMMUNITY)
Admission: RE | Admit: 2024-04-07 | Discharge: 2024-04-07 | Disposition: A | Discharge status: Home / Self Care | Source: Ambulatory Visit | Attending: Cardiology | Admitting: Cardiology

## 2024-04-07 ENCOUNTER — Inpatient Hospital Stay (HOSPITAL_COMMUNITY): Admission: RE | Admit: 2024-04-07 | Source: Ambulatory Visit

## 2024-04-07 DIAGNOSIS — I5022 Chronic systolic (congestive) heart failure: Secondary | ICD-10-CM | POA: Diagnosis present

## 2024-04-07 LAB — BASIC METABOLIC PANEL WITH GFR
Anion gap: 14 (ref 5–15)
BUN: 13 mg/dL (ref 6–20)
CO2: 21 mmol/L — ABNORMAL LOW (ref 22–32)
Calcium: 9.4 mg/dL (ref 8.9–10.3)
Chloride: 107 mmol/L (ref 98–111)
Creatinine, Ser: 0.89 mg/dL (ref 0.44–1.00)
GFR, Estimated: >60 mL/min (ref >60)
Glucose, Bld: 180 mg/dL — ABNORMAL HIGH (ref 70–99)
Potassium: 4.4 mmol/L (ref 3.5–5.1)
Sodium: 142 mmol/L (ref 135–145)

## 2024-04-08 ENCOUNTER — Telehealth (HOSPITAL_COMMUNITY): Payer: Self-pay | Admitting: Cardiology

## 2024-04-08 NOTE — Telephone Encounter (Signed)
Pt aware.

## 2024-04-08 NOTE — Telephone Encounter (Signed)
 If she is having chest pain would recommend ED evaluation.   Please call.   Brandelyn Henne NP-C  9:31 AM

## 2024-04-08 NOTE — Telephone Encounter (Signed)
 Patient called to request add on appt 5/21  Reports she is again feeling like she did prior to her appt 5/13 Reports overall heaviness, chest feels tight,feels full of fluid   -no weight to report -denies SOB -reports mild abd swelling   Torsemide  60 mg daily  Reports good use/response with metolazone  use last week despite muscle cramps   Currently scheduled for follow up 6/4   Please advise

## 2024-04-14 NOTE — Progress Notes (Signed)
 Remote ICD transmission.

## 2024-04-14 NOTE — Addendum Note (Signed)
 Addended by: Lott Rouleau A on: 04/14/2024 12:43 PM   Modules accepted: Orders

## 2024-04-21 NOTE — Progress Notes (Signed)
 [facility] Note  Date:  04/22/24  ID:  Julie Hernandez, DOB 1971/01/07, MRN [id number]  Location: Home  Type of Visit: Established patient  PCP:  Araceli Beady, FNP  EP: Dr. Carolynne Citron HF Cardiologist: Dr. Julane Ny   HPI: Julie Hernandez is a 53 y.o. woman with COPD/ongoing tobacco abuse (vaping), premature CAD s/p anterior MI, diet-controlled DM2, chronic back pain, and systolic HF due to [facility] referred for further evaluation of CAD and systolic HF.  Had anterior MI in 2010. LAD stent at [facility]. Went to [facility] that same year and had  a second stent placed. Did well from a cardiac perspective until 3/21.   Cath 02/08/20 at [facility] for NSTEMI. 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 Was told she may need repeat stenting vs CABG. Referred here for second opinion.   We saw her in 5/21 with daily angina. Underwent MRI which showed EF 26% only minimal viability in anterior wall. Decision to proceed with PCI of LCX and LAD (ISR) over CABG. Had successful PCI of LCX and LAD with Dr. Arlester Ladd on 5/20.   Echo 9/21 EF 60-65%.  Echo 04/06/21 EF 50-55% mild anterior and apical HK  Admitted 10/23 with NSTEMI. Echo EF back down to 20% and RV mildly reduced. Underwent R/LHC which showed elevated right and left filling pressures and low CO;  occluded LAD stent and new CTO of RCA with L>>R collaterals. Underwent staged PCI with DES to mid LAD.   Seen in ED 12/25/22 with SVT  Echo 01/11/23 EF 25-30% with WMA c/w previous LAD infarct Sleep 01/08/23 Moderate OSA 20.6 CSA 1.6  Zio 2/24 50 runs NSVT (longest 11 beats) 8.5% PVCs  cMRI 8/24 showed LVEF 26%, RVEF 39%, large anterior infarct--> referred to EP for ICD.  Zio 12/24:  Mostly NSR, but 25 runs of NSVT and 11.8% PVC burden. Started on Mexilitine  S/p ICD insertion 1/25.   Acute visit  03/31/24,  volume up. Lasix  switched to torsemide  60 daily, given metolazone  2/5 mg/extra 40 KCL x 1 dose.    Today she returns for HF follow up. Overall feeling fair. Urinated briskly with switch to torsemide , then urination leveled out. Had leg cramping after taking metolazone /extra KCL. She is SOB walking short distances on flat ground. Continues with atypical chest pain, no clear trigger. Took nitroglycerin  last week. Legs, abdomen and hands are swelling.  Occasional palpitations. Denies abnormal bleeding, PND/Orthopnea. Appetite ok. Weight at home 159-160 pounds. Taking all medications. Cuting back on vaping. Had iron transfusion in [place] in 2015, had hives afterwards. Had another transfusion afterwards that was run slower, and no symptoms.   Cardiac Studies - LHC 12/24: Patent left main with mild nonobstructive plaquing.  Patent LAD with moderate to severe focal in-stent restenosis in the distal stented segment, estimated at 70%. Widely patent circumflex stent. Totally occluded RCA with left-to-right collaterals, unchanged from the previous cath study.   - cMRI (8/24): showed LVEF 26%, RVEF 39%, large anterior infarct  - R/LHC (10/23): RA 23 mean, PA 48/35 mean 39, PCWP mean 30, CO/CI (Fick) 3.7/2 Severe 2v CAD with 90% in stent re-stenonsis of mid LAD and CTO of mid RCA with L>>R collaterals  - Echo (10/23): EF 20%, RV mildly down.  - Echo (5/22): EF 50-55%, mid anterior and apical HK  - Echo (  9/21): EF 60-65%  - Zio (7/21) 1. Sinus rhythm - avg HR of 82 2. 13 Ventricular Tachycardia runs occurred, the run with the fastest interval lasting 12 beats with a max rate of 139 bpm (avg 110 bpm); the run with the fastest interval was also the longest 3. Very frequents PVCs (32.2%, [id number]), VE Couplets were rare (<1.0%, 2118),  4. Ventricular Bigeminy and Trigeminy were present. 5.. Multiple patient-triggered events associated  - cMRI (03/29/20) 1. Subendocardial late gadolinium enhancement  consistent with prior infarct in the LAD territory. There is >50% transmural LGE suggesting nonviability in the basal to apical anterior wall and apex. There is <50% transmural LGE suggesting viability in the basal to mid anteroseptum and apical septum 2.  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 Normal LV size with severe systolic dysfunction (EF 26%). Akinesis of basal to apical anterior/anteroseptal walls and apex 4.  Small RV size with normal systolic function (EF 62%)  Past Medical History:  Diagnosis Date   CHF (congestive heart failure) (HCC)    Coronary artery disease    COVID 12/03/2020   Diabetes mellitus without complication (HCC)    Hypertension    OSA on CPAP    moderate obstructive sleep apnea with an AHI of 20.6/h and he was placed on auto CPAP from 4 to 15 cm H2O   Current Outpatient Medications  Medication Sig Dispense Refill   albuterol  (VENTOLIN  HFA) 108 (90 Base) MCG/ACT inhaler Inhale 2 puffs into the lungs every 4 (four) hours as needed for wheezing or shortness of breath.     ALPRAZolam (XANAX) 0.5 MG tablet Take 0.5 mg by mouth.     aspirin  EC 81 MG tablet Take 1 tablet (81 mg total) by mouth daily. 30 tablet 6   Aspirin -Acetaminophen -Caffeine (GOODY HEADACHE PO) Take 1 packet by mouth daily as needed (headaches).     blood glucose meter kit and supplies Dispense based on patient and insurance preference. Use up to four times daily as directed. (FOR ICD-10 E10.9, E11.9). 1 each 1   clopidogrel  (PLAVIX ) 75 MG tablet Take 1 tablet (75 mg total) by mouth daily. 30 tablet 11   Continuous Glucose Sensor (FREESTYLE LIBRE 3 PLUS SENSOR) MISC Use to monitor glucose continuously as directed. Change sensor every 15 days 6 each 3   escitalopram  (LEXAPRO ) 10 MG tablet Take 10 mg by mouth every evening.     Evolocumab  (REPATHA  SURECLICK) 140 MG/ML SOAJ Inject 140 mg into the skin every 14 (fourteen) days. 6 mL 3   ezetimibe  (ZETIA ) 10 MG tablet Take 1 tablet (10 mg total)  by mouth daily at 6pm. 30 tablet 11   famotidine  (PEPCID ) 40 MG tablet Take 40 mg by mouth daily as needed for heartburn.     Insulin  Pen Needle 32G X 4 MM MISC Use with insulin  pen 100 each 0   JARDIANCE  10 MG TABS tablet Take 1 tablet (10 mg total) by mouth daily before breakfast. 90 tablet 3   metFORMIN  (GLUCOPHAGE ) 500 MG tablet Take 2 tablets (1,000 mg total) by mouth 2 (two) times daily with a meal. 360 tablet 3   metolazone  (ZAROXOLYN ) 2.5 MG tablet Take 1 tablet (2.5 mg total) by mouth as directed. Take as directed by the heart failure clinic ONLY 6 tablet 0   metoprolol  succinate (TOPROL  XL) 25 MG 24 hr tablet Take 1 tablet (25 mg total) by mouth daily. 30 tablet 11   mexiletine (MEXITIL) 200 MG capsule Take 1 capsule (200  mg total) by mouth 2 (two) times daily. 60 capsule 11   nitroGLYCERIN  (NITROSTAT ) 0.4 MG SL tablet PLACE 1 TABLET UNDER THE TONGUE AND ALLOW TO DISSOLVE SLOWLY, DO NOT CHEW OR SWALLOW. YOU MAY USE ADDITIONAL TABLETS EVERY 5 MINUTES BUT NO MORE THAN 3 TABLETS. 25 tablet 1   potassium chloride  SA (KLOR-CON  M) 20 MEQ tablet Take 2 tablets (40 mEq total) by mouth daily. 60 tablet 6   promethazine (PHENERGAN) 25 MG tablet promethazine 25 mg tablet  TAKE ONE TABLET BY MOUTH EVERY 6 HOURS AS NEEDED     RABEprazole (ACIPHEX) 20 MG tablet Take 20 mg by mouth 2 (two) times daily.     Semaglutide , 1 MG/DOSE, 4 MG/3ML SOPN Inject 1 mg as directed once a week. (Patient taking differently: Inject 1 mg as directed once a week. Takes on Mondays) 9 mL 1   spironolactone  (ALDACTONE ) 25 MG tablet Take 1 tablet (25 mg total) by mouth daily. 30 tablet 11   torsemide  (DEMADEX ) 20 MG tablet Take 3 tablets (60 mg total) by mouth daily. 200 tablet 3   No current facility-administered medications for this encounter.   Allergies  Allergen Reactions   Esomeprazole Magnesium Anaphylaxis   Ciprofibrate Itching   Ciprofloxacin Itching   Gabapentin Other (See Comments)    Achy - myalgia    Lubiprostone Diarrhea   Statins     Myalgia - flu like symptoms   Amlodipine Rash   Iron Rash and Swelling   Latex Rash and Swelling   Povidone Iodine Rash   Social History   Socioeconomic History   Marital status: Divorced    Spouse name: Not on file   Number of children: 2   Years of education: Not on file   Highest education level: Not on file  Occupational History   Occupation: On disability  Tobacco Use   Smoking status: Former    Current packs/day: 0.00    Types: Cigarettes    Quit date: 02/18/2020    Years since quitting: 4.1   Smokeless tobacco: Never  Vaping Use   Vaping status: Some Days  Substance and Sexual Activity   Alcohol  use: Yes    Comment: SOCIAL   Drug use: Never   Sexual activity: Not on file  Other Topics Concern   Not on file  Social History Narrative   Not on file   Social Drivers of Health   Financial Resource Strain: Not on file  Food Insecurity: Not on file  Transportation Needs: Not on file  Physical Activity: Not on file  Stress: Not on file  Social Connections: Not on file  Intimate Partner Violence: Not on file   FHx: M died at 18 from MI and HF D had stroke No brother and sisters  BP 100/64   Pulse 93   Wt 76.8 kg (169 lb 6.4 oz)   SpO2 99%   BMI 30.98 kg/m   Wt Readings from Last 3 Encounters:  04/22/24 76.8 kg (169 lb 6.4 oz)  03/31/24 75.2 kg (165 lb 12.8 oz)  03/10/24 73.5 kg (162 lb)   PHYSICAL EXAM General:  NAD. No resp difficulty, walked into clinic, fatigued-appearing HEENT: Normal Neck: Supple. No JVD. Cor: Regular rate & rhythm. No rubs, gallops or murmurs. Lungs: Clear Abdomen: Soft, nontender, nondistended.  Extremities: No cyanosis, clubbing, rash, edema Neuro: Alert & oriented x 3, moves all 4 extremities w/o difficulty. Affect pleasant.  ECG (Personally reviewed) : 93 bpm  ICD interrogation (personally reviewed): HL  score 14, 88 bpm average HR, 1 hr/day activity, no VT  ReDs reading: 35%,  abnormal   ASSESSMENT & PLAN: 1. CAD - prior anterior MI w/ h/o PCI to mLAD and mLCX - Echo w/ drop in EF, from 55% >> 20%, RV mildly reduced - Cath (10/23): 90% in-stent restenosis of mLAD and CTO mRCA with left-to-right collaterals. LCx stent widely patent. -> S/P PCI DES mid LAD. - LHC 12/24: Patent left main with mild nonobstructive plaquing.  Patent LAD with moderate to severe focal in-stent restenosis in the distal stented segment, estimated at 70%. Widely patent circumflex stent. Totally occluded RCA with left-to-right collaterals, unchanged from the previous cath study. Plan for medical therapy for now.  - Continues with CP, some typical and atypical features. ECG ok today. ? If iron deficiency contributing (see below). - No BP room to add Imdur. Consider Ranexa. - Continue DAPT. - Lipids managed by Lipid Clinic. LDL 90 2/25  2. Chronic Systolic Heart Failure - Previous history of ICM following prior anterior MI.  - EF previously 25-30%, improved post LAD and LCx PCI - Echo (9/21): EF 60-65% - Echo (04/06/21): EF 50-55% mild anterior and apical HK - Echo (10/23): EF down 20%, RV mildly reduced in setting of LAD and RCA infarcts, previously placed mLAD stent w/ 99% stenosis, CTO mRCA w/ L>R collaterals  - RHC (10/23):  w/ elevated R+ L filling pressures and low output, RA 23, PCWP 30, CI 2.0  - Echo 01/11/23 EF 25-30% with WMA c/w previous LAD infarct - cMRI (8/24): LVEF 26%, RVEF 39%, large anterior infarct - s/ ICD 1/25. - NYHA III-IIIb, does not appear markedly volume overloaded by exam, REDs 35%  - With symptoms, increase torsemide  to 60 mg qam and 40 mg q pm - Increase KCL to 60 daily. - Continue Toprol  XL 25 mg daily. - Continue spironolactone  25 mg daily.   - Continue Jardiance . - No BP to add ARB/ARNi - Labs today. Repeat BMET in 2 weeks. - Update echo next visit  3. SVT/PVCs - Zio (05/2020) showed mostly SR, frequent PVCs (32% burden), 13 runs of VT - Zio 2/24 50 runs  NSVT (longest 11 beats) 8.5% PVCs - EF previously improved w/o PVC suppression, so unlikely contributing to CM - Would like to avoid amio with young age and vaping - Zio 12/24:  Mostly NSR, but 25 runs of NSVT and 11.8% PVC burden. Started on Mexilitine - Continue mexiletine 200 mg bid - None on ECG today - Continue CPAP, tries to use it daily  4. DM2 - improving control - Hgb A1c 13.1 (11/23)--> 8.5 (1/24)-> 6.2 (11/24) - Followed by PCP/ENDO - Continue SGLT2i and Ozempic    5. HL w/ LDL Goal < 55 - Statin intolerant. - Continue Repatha  + Zetia  - Followed by Lipid Clinic   6. H/o Tobacco Use - Quit cigarettes, currently vapes  - Discussed need for cessation - She is cutting down  7. Obesity - Body mass index is 30.98 kg/m. - Continue Ozempic   8. OSA - Sleep study 01/08/23 Moderate OSA 20.6 CSA 1.6 - Continue CPAP  - Follows with Dr. Micael Adas  9. Carotid bruit - Carotid u/s with no significant dz  10. Iron deficiency - Tsat 5, ferritin 9 - ? If contributing to symptoms. - Needs iron infusion. Will need pre-meds as she had reaction in past  Follow up in 3 months with Dr. Julane Ny + echo  Elmarie Hacking, FNP  3:01 PM

## 2024-04-22 ENCOUNTER — Encounter (HOSPITAL_COMMUNITY): Payer: Self-pay

## 2024-04-22 ENCOUNTER — Ambulatory Visit (HOSPITAL_COMMUNITY)
Admission: RE | Admit: 2024-04-22 | Discharge: 2024-04-22 | Disposition: A | Discharge status: Home / Self Care | Source: Ambulatory Visit | Attending: Family Medicine | Admitting: Family Medicine

## 2024-04-22 ENCOUNTER — Ambulatory Visit (HOSPITAL_COMMUNITY): Payer: Self-pay | Admitting: Family Medicine

## 2024-04-22 DIAGNOSIS — I5022 Chronic systolic (congestive) heart failure: Secondary | ICD-10-CM

## 2024-04-22 DIAGNOSIS — E669 Obesity, unspecified: Secondary | ICD-10-CM | POA: Diagnosis not present

## 2024-04-22 DIAGNOSIS — E611 Iron deficiency: Secondary | ICD-10-CM

## 2024-04-22 DIAGNOSIS — I2510 Atherosclerotic heart disease of native coronary artery without angina pectoris: Secondary | ICD-10-CM

## 2024-04-22 DIAGNOSIS — I110 Hypertensive heart disease with heart failure: Secondary | ICD-10-CM | POA: Diagnosis not present

## 2024-04-22 DIAGNOSIS — Z7985 Long-term (current) use of injectable non-insulin antidiabetic drugs: Secondary | ICD-10-CM | POA: Insufficient documentation

## 2024-04-22 DIAGNOSIS — G8929 Other chronic pain: Secondary | ICD-10-CM | POA: Insufficient documentation

## 2024-04-22 DIAGNOSIS — G4733 Obstructive sleep apnea (adult) (pediatric): Secondary | ICD-10-CM | POA: Diagnosis not present

## 2024-04-22 DIAGNOSIS — Z87891 Personal history of nicotine dependence: Secondary | ICD-10-CM

## 2024-04-22 DIAGNOSIS — E119 Type 2 diabetes mellitus without complications: Secondary | ICD-10-CM | POA: Insufficient documentation

## 2024-04-22 DIAGNOSIS — Z6830 Body mass index (BMI) 30.0-30.9, adult: Secondary | ICD-10-CM | POA: Insufficient documentation

## 2024-04-22 DIAGNOSIS — I471 Supraventricular tachycardia, unspecified: Secondary | ICD-10-CM

## 2024-04-22 DIAGNOSIS — Z95810 Presence of automatic (implantable) cardiac defibrillator: Secondary | ICD-10-CM | POA: Insufficient documentation

## 2024-04-22 DIAGNOSIS — R0989 Other specified symptoms and signs involving the circulatory and respiratory systems: Secondary | ICD-10-CM | POA: Insufficient documentation

## 2024-04-22 DIAGNOSIS — I493 Ventricular premature depolarization: Secondary | ICD-10-CM

## 2024-04-22 DIAGNOSIS — Z955 Presence of coronary angioplasty implant and graft: Secondary | ICD-10-CM | POA: Insufficient documentation

## 2024-04-22 DIAGNOSIS — Z7984 Long term (current) use of oral hypoglycemic drugs: Secondary | ICD-10-CM | POA: Insufficient documentation

## 2024-04-22 DIAGNOSIS — Z716 Tobacco abuse counseling: Secondary | ICD-10-CM | POA: Insufficient documentation

## 2024-04-22 DIAGNOSIS — R0789 Other chest pain: Secondary | ICD-10-CM | POA: Insufficient documentation

## 2024-04-22 DIAGNOSIS — J449 Chronic obstructive pulmonary disease, unspecified: Secondary | ICD-10-CM | POA: Insufficient documentation

## 2024-04-22 DIAGNOSIS — I252 Old myocardial infarction: Secondary | ICD-10-CM | POA: Diagnosis not present

## 2024-04-22 DIAGNOSIS — M7989 Other specified soft tissue disorders: Secondary | ICD-10-CM | POA: Insufficient documentation

## 2024-04-22 DIAGNOSIS — Z79899 Other long term (current) drug therapy: Secondary | ICD-10-CM | POA: Insufficient documentation

## 2024-04-22 DIAGNOSIS — E782 Mixed hyperlipidemia: Secondary | ICD-10-CM

## 2024-04-22 DIAGNOSIS — E118 Type 2 diabetes mellitus with unspecified complications: Secondary | ICD-10-CM

## 2024-04-22 DIAGNOSIS — F17290 Nicotine dependence, other tobacco product, uncomplicated: Secondary | ICD-10-CM | POA: Insufficient documentation

## 2024-04-22 DIAGNOSIS — Z794 Long term (current) use of insulin: Secondary | ICD-10-CM | POA: Diagnosis not present

## 2024-04-22 LAB — BRAIN NATRIURETIC PEPTIDE: B Natriuretic Peptide: 1156.9 pg/mL — ABNORMAL HIGH (ref 0.0–100.0)

## 2024-04-22 LAB — BASIC METABOLIC PANEL WITH GFR
Anion gap: 11 (ref 5–15)
BUN: 22 mg/dL — ABNORMAL HIGH (ref 6–20)
CO2: 25 mmol/L (ref 22–32)
Calcium: 9.1 mg/dL (ref 8.9–10.3)
Chloride: 104 mmol/L (ref 98–111)
Creatinine, Ser: 1.02 mg/dL — ABNORMAL HIGH (ref 0.44–1.00)
GFR, Estimated: >60 mL/min (ref >60)
Glucose, Bld: 130 mg/dL — ABNORMAL HIGH (ref 70–99)
Potassium: 4.5 mmol/L (ref 3.5–5.1)
Sodium: 140 mmol/L (ref 135–145)

## 2024-04-22 LAB — CBC
HCT: 37.1 % (ref 36.0–46.0)
Hemoglobin: 10.7 g/dL — ABNORMAL LOW (ref 12.0–15.0)
MCH: 23 pg — ABNORMAL LOW (ref 26.0–34.0)
MCHC: 28.8 g/dL — ABNORMAL LOW (ref 30.0–36.0)
MCV: 79.8 fL — ABNORMAL LOW (ref 80.0–100.0)
Platelets: 378 10*3/uL (ref 150–400)
RBC: 4.65 MIL/uL (ref 3.87–5.11)
RDW: 16 % — ABNORMAL HIGH (ref 11.5–15.5)
WBC: 9.1 10*3/uL (ref 4.0–10.5)
nRBC: 0 % (ref 0.0–0.2)

## 2024-04-22 LAB — MAGNESIUM: Magnesium: 2.3 mg/dL (ref 1.7–2.4)

## 2024-04-22 MED ORDER — TORSEMIDE 20 MG PO TABS: ORAL_TABLET | ORAL | 3 refills | Status: DC

## 2024-04-22 MED ORDER — POTASSIUM CHLORIDE CRYS ER 20 MEQ PO TBCR: 60.0000 meq | EXTENDED_RELEASE_TABLET | Freq: Every day | ORAL | 11 refills | Status: DC

## 2024-04-22 NOTE — Patient Instructions (Addendum)
 Thank you for coming in today  If you had labs drawn today, any labs that are abnormal the clinic will call you No news is good news  Medications: Increase Potassium to 60 meq 3 tablets daily Increase Torsemide  to 60 mg every morning 40 mg every evening You will be contacted by our office regarding your iron infusions  Follow up appointments:  Your physician recommends that you schedule a follow-up appointment in:  3 months With Dr. Bensimhon with echocardiogram Please call our office to schedule the follow-up appointment in July for September 2025.  Your physician has requested that you have an echocardiogram. Echocardiography is a painless test that uses sound waves to create images of your heart. It provides your doctor with information about the size and shape of your heart and how well your heart's chambers and valves are working. This procedure takes approximately one hour. There are no restrictions for this procedure.      Do the following things EVERYDAY: Weigh yourself in the morning before breakfast. Write it down and keep it in a log. Take your medicines as prescribed Eat low salt foods--Limit salt (sodium) to 2000 mg per day.  Stay as active as you can everyday Limit all fluids for the day to less than 2 liters   At the [facility], you and your health needs are our priority. As part of our continuing mission to provide you with exceptional heart care, we have created designated Provider Care Teams. These Care Teams include your primary Cardiologist (physician) and Advanced Practice Providers (APPs- Physician Assistants and Nurse Practitioners) who all work together to provide you with the care you need, when you need it.   You may see any of the following providers on your designated Care Team at your next follow up: Dr Jules Oar Dr Peder Bourdon Dr. Mimi Alt, NP Ruddy Corral, [name] [facility] [place],  [name] Dennise Fitz, NP Luster Salters, [name]   Please be sure to bring in all your medications bottles to every appointment.    Thank you for choosing [facility]  If you have any questions or concerns before your next appointment please send us  a message through [place] or call our office at [phone].    TO LEAVE A MESSAGE FOR THE NURSE SELECT OPTION 2, PLEASE LEAVE A MESSAGE INCLUDING: YOUR NAME DATE OF BIRTH CALL BACK NUMBER REASON FOR CALL**this is important as we prioritize the call backs  YOU WILL RECEIVE A CALL BACK THE SAME DAY AS LONG AS YOU CALL BEFORE 4:00 PM

## 2024-04-22 NOTE — Progress Notes (Signed)
 ReDS Vest / Clip - 04/22/24 1600       ReDS Vest / Clip   Station Marker A    Ruler Value 36    ReDS Value Range Low volume    ReDS Actual Value 35

## 2024-04-22 NOTE — Addendum Note (Signed)
 Encounter addended by: Dewey Fordyce, CMA on: 04/22/2024 4:22 PM  Actions taken: Visit diagnoses modified, Order list changed, Diagnosis association updated, Flowsheet accepted, Clinical Note Signed, Charge Capture section accepted

## 2024-04-23 MED ORDER — TORSEMIDE 20 MG PO TABS: 60.0000 mg | ORAL_TABLET | Freq: Two times a day (BID) | ORAL | Status: DC

## 2024-04-23 MED ORDER — POTASSIUM CHLORIDE CRYS ER 20 MEQ PO TBCR: 40.0000 meq | EXTENDED_RELEASE_TABLET | Freq: Two times a day (BID) | ORAL | Status: AC

## 2024-04-23 NOTE — Telephone Encounter (Addendum)
 Pt aware, agreeable, and verbalized understanding  Med list updated   ----- Message from Elmarie Hacking sent at 04/22/2024  4:48 PM EDT ----- Hgb has dropped some, watch for signs of bleeding. We are awaiting scheduling for iron infusion.

## 2024-05-01 ENCOUNTER — Encounter: Payer: Self-pay | Admitting: Cardiology

## 2024-05-01 ENCOUNTER — Telehealth (HOSPITAL_COMMUNITY): Payer: Self-pay | Admitting: Cardiology

## 2024-05-01 NOTE — Telephone Encounter (Signed)
   Call back and instruct to hold torsemide  and potassium until nausea/vomiting resolved.   Please call.  Dot Splinter NP-C  11:18 AM

## 2024-05-01 NOTE — Telephone Encounter (Signed)
 Patient called to report she was diagnosed with COVID today at urgent care  Symptoms started Tuesday night (congestion/cough etc)  Reports increase N/V x 2 days unable to keep anything down including medication  -would like to know if she should be doing anything special with  cardiac history or HF meds  -reports PO intake is poor    Meds written at urgent care  Promethazine DM Zofran  Molnupiravir 200mg 

## 2024-05-01 NOTE — Telephone Encounter (Signed)
Pt aware.

## 2024-05-06 ENCOUNTER — Other Ambulatory Visit (HOSPITAL_COMMUNITY)

## 2024-05-19 ENCOUNTER — Ambulatory Visit (HOSPITAL_COMMUNITY)
Admission: RE | Admit: 2024-05-19 | Discharge: 2024-05-19 | Disposition: A | Discharge status: Home / Self Care | Source: Ambulatory Visit | Attending: Internal Medicine | Admitting: Internal Medicine

## 2024-05-19 ENCOUNTER — Other Ambulatory Visit (HOSPITAL_COMMUNITY): Payer: Self-pay

## 2024-05-19 DIAGNOSIS — I5022 Chronic systolic (congestive) heart failure: Secondary | ICD-10-CM | POA: Diagnosis present

## 2024-05-19 DIAGNOSIS — D509 Iron deficiency anemia, unspecified: Secondary | ICD-10-CM | POA: Insufficient documentation

## 2024-05-19 MED ORDER — SODIUM CHLORIDE 0.9 % IV SOLN
510.0000 mg | Freq: Once | INTRAVENOUS | Status: AC
  Administered 2024-05-19: 510 mg via INTRAVENOUS
  Filled 2024-05-19: qty 510

## 2024-05-21 ENCOUNTER — Other Ambulatory Visit (HOSPITAL_COMMUNITY): Payer: Self-pay | Admitting: Internal Medicine

## 2024-05-21 DIAGNOSIS — I5022 Chronic systolic (congestive) heart failure: Secondary | ICD-10-CM

## 2024-05-26 ENCOUNTER — Ambulatory Visit: Payer: PPO

## 2024-05-26 DIAGNOSIS — I255 Ischemic cardiomyopathy: Secondary | ICD-10-CM | POA: Diagnosis not present

## 2024-05-27 LAB — CUP PACEART REMOTE DEVICE CHECK
Battery Remaining Longevity: 162 mo
Battery Remaining Percentage: 100 %
Brady Statistic RV Percent Paced: 0 %
Date Time Interrogation Session: 20250708083400
HighPow Impedance: 63 Ohm
Implantable Lead Connection Status: 753985
Implantable Lead Implant Date: 20250106
Implantable Lead Location: 753860
Implantable Lead Model: 137
Implantable Lead Serial Number: 301618
Implantable Pulse Generator Implant Date: 20250106
Lead Channel Impedance Value: 485 Ohm
Lead Channel Pacing Threshold Amplitude: 0.6 V
Lead Channel Pacing Threshold Pulse Width: 0.4 ms
Lead Channel Setting Pacing Amplitude: 2.5 V
Lead Channel Setting Pacing Pulse Width: 0.4 ms
Lead Channel Setting Sensing Sensitivity: 0.5 mV
Pulse Gen Serial Number: 222540

## 2024-05-28 ENCOUNTER — Ambulatory Visit: Payer: Self-pay | Admitting: Internal Medicine

## 2024-06-03 ENCOUNTER — Other Ambulatory Visit (HOSPITAL_COMMUNITY): Payer: Self-pay | Admitting: *Deleted

## 2024-06-03 DIAGNOSIS — I5022 Chronic systolic (congestive) heart failure: Secondary | ICD-10-CM

## 2024-06-03 DIAGNOSIS — D508 Other iron deficiency anemias: Secondary | ICD-10-CM

## 2024-06-05 ENCOUNTER — Other Ambulatory Visit (HOSPITAL_COMMUNITY): Payer: Self-pay

## 2024-06-05 DIAGNOSIS — I5022 Chronic systolic (congestive) heart failure: Secondary | ICD-10-CM

## 2024-06-05 MED ORDER — ASPIRIN 81 MG PO TBEC: 81.0000 mg | DELAYED_RELEASE_TABLET | Freq: Every day | ORAL | 11 refills | Status: AC

## 2024-06-09 ENCOUNTER — Other Ambulatory Visit: Payer: Self-pay | Admitting: Internal Medicine

## 2024-06-09 ENCOUNTER — Ambulatory Visit (HOSPITAL_COMMUNITY)
Admission: RE | Admit: 2024-06-09 | Discharge: 2024-06-09 | Disposition: A | Discharge status: Home / Self Care | Source: Ambulatory Visit | Attending: Cardiology | Admitting: Cardiology

## 2024-06-09 DIAGNOSIS — I5022 Chronic systolic (congestive) heart failure: Secondary | ICD-10-CM | POA: Insufficient documentation

## 2024-06-09 LAB — BASIC METABOLIC PANEL WITH GFR
Anion gap: 15 (ref 5–15)
BUN: 14 mg/dL (ref 6–20)
CO2: 28 mmol/L (ref 22–32)
Calcium: 9.4 mg/dL (ref 8.9–10.3)
Chloride: 103 mmol/L (ref 98–111)
Creatinine, Ser: 1.13 mg/dL — ABNORMAL HIGH (ref 0.44–1.00)
GFR, Estimated: 58 mL/min — ABNORMAL LOW (ref >60)
Glucose, Bld: 149 mg/dL — ABNORMAL HIGH (ref 70–99)
Potassium: 4 mmol/L (ref 3.5–5.1)
Sodium: 146 mmol/L — ABNORMAL HIGH (ref 135–145)

## 2024-06-18 ENCOUNTER — Encounter: Payer: Self-pay | Admitting: Nurse Practitioner

## 2024-06-18 ENCOUNTER — Ambulatory Visit: Admitting: Nurse Practitioner

## 2024-06-18 VITALS — BP 104/60 | HR 83 | Ht 62.0 in | Wt 151.0 lb

## 2024-06-18 DIAGNOSIS — Z7984 Long term (current) use of oral hypoglycemic drugs: Secondary | ICD-10-CM | POA: Diagnosis not present

## 2024-06-18 DIAGNOSIS — Z794 Long term (current) use of insulin: Secondary | ICD-10-CM | POA: Diagnosis not present

## 2024-06-18 DIAGNOSIS — Z7985 Long-term (current) use of injectable non-insulin antidiabetic drugs: Secondary | ICD-10-CM | POA: Diagnosis not present

## 2024-06-18 DIAGNOSIS — E782 Mixed hyperlipidemia: Secondary | ICD-10-CM

## 2024-06-18 DIAGNOSIS — E1165 Type 2 diabetes mellitus with hyperglycemia: Secondary | ICD-10-CM | POA: Diagnosis not present

## 2024-06-18 DIAGNOSIS — E1159 Type 2 diabetes mellitus with other circulatory complications: Secondary | ICD-10-CM

## 2024-06-18 LAB — POCT GLYCOSYLATED HEMOGLOBIN (HGB A1C): Hemoglobin A1C: 6.6 % — AB (ref 4.0–5.6)

## 2024-06-18 MED ORDER — EMPAGLIFLOZIN 10 MG PO TABS: 10.0000 mg | ORAL_TABLET | Freq: Every day | ORAL | 3 refills | Status: DC

## 2024-06-18 MED ORDER — METFORMIN HCL 500 MG PO TABS: 1000.0000 mg | ORAL_TABLET | Freq: Two times a day (BID) | ORAL | 3 refills | Status: DC

## 2024-06-18 NOTE — Progress Notes (Signed)
 Endocrinology Follow Up Note       06/18/2024, 2:55 PM   Subjective:    Patient ID: Julie Hernandez, female    DOB: 10/01/1971.  BERDELL NEVITT is being seen in follow up after being seen in consultation for management of currently uncontrolled symptomatic diabetes requested by  Sofie Lolita DEL, FNP.   Past Medical History:  Diagnosis Date   CHF (congestive heart failure) (HCC)    Coronary artery disease    COVID 12/03/2020   Diabetes mellitus without complication (HCC)    Hypertension    OSA on CPAP    moderate obstructive sleep apnea with an AHI of 20.6/h and he was placed on auto CPAP from 4 to 15 cm H2O    Past Surgical History:  Procedure Laterality Date   CORONARY BALLOON ANGIOPLASTY N/A 04/07/2020   Procedure: CORONARY BALLOON ANGIOPLASTY;  Surgeon: Wonda Sharper, MD;  Location: [facility] INVASIVE CV LAB;  Service: Cardiovascular;  Laterality: N/A;   CORONARY PRESSURE/FFR STUDY N/A 04/07/2020   Procedure: INTRAVASCULAR PRESSURE WIRE/FFR STUDY;  Surgeon: Wonda Sharper, MD;  Location: [facility] INVASIVE CV LAB;  Service: Cardiovascular;  Laterality: N/A;   CORONARY STENT INTERVENTION N/A 04/07/2020   Procedure: CORONARY STENT INTERVENTION;  Surgeon: Wonda Sharper, MD;  Location: [facility] INVASIVE CV LAB;  Service: Cardiovascular;  Laterality: N/A;   CORONARY STENT INTERVENTION N/A 09/18/2022   Procedure: CORONARY STENT INTERVENTION;  Surgeon: Wonda Sharper, MD;  Location: [facility] INVASIVE CV LAB;  Service: Cardiovascular;  Laterality: N/A;   CORONARY ULTRASOUND/IVUS N/A 04/07/2020   Procedure: Intravascular Ultrasound/IVUS;  Surgeon: Wonda Sharper, MD;  Location: [facility] INVASIVE CV LAB;  Service: Cardiovascular;  Laterality: N/A;   ICD IMPLANT N/A 11/25/2023   Procedure: ICD IMPLANT;  Surgeon: Waddell Danelle ORN, MD;  Location: [facility] INVASIVE CV LAB;  Service: Cardiovascular;  Laterality: N/A;   LEFT HEART CATH AND CORONARY  ANGIOGRAPHY N/A 04/07/2020   Procedure: LEFT HEART CATH AND CORONARY ANGIOGRAPHY;  Surgeon: Wonda Sharper, MD;  Location: [facility] INVASIVE CV LAB;  Service: Cardiovascular;  Laterality: N/A;   LEFT HEART CATH AND CORONARY ANGIOGRAPHY N/A 04/14/2021   Procedure: LEFT HEART CATH AND CORONARY ANGIOGRAPHY;  Surgeon: Cherrie Toribio SAUNDERS, MD;  Location: [facility] INVASIVE CV LAB;  Service: Cardiovascular;  Laterality: N/A;   LEFT HEART CATH AND CORONARY ANGIOGRAPHY N/A 11/18/2023   Procedure: LEFT HEART CATH AND CORONARY ANGIOGRAPHY;  Surgeon: Wonda Sharper, MD;  Location: [facility] INVASIVE CV LAB;  Service: Cardiovascular;  Laterality: N/A;   RIGHT/LEFT HEART CATH AND CORONARY ANGIOGRAPHY N/A 09/17/2022   Procedure: RIGHT/LEFT HEART CATH AND CORONARY ANGIOGRAPHY;  Surgeon: Mady Bruckner, MD;  Location: [facility] INVASIVE CV LAB;  Service: Cardiovascular;  Laterality: N/A;    Social History   Socioeconomic History   Marital status: Divorced    Spouse name: Not on file   Number of children: 2   Years of education: Not on file   Highest education level: Not on file  Occupational History   Occupation: On disability  Tobacco Use   Smoking status: Former    Current packs/day: 0.00    Types: Cigarettes    Quit date: 02/18/2020    Years since quitting: 4.3  Smokeless tobacco: Never  Vaping Use   Vaping status: Some Days  Substance and Sexual Activity   Alcohol  use: Yes    Comment: SOCIAL   Drug use: Never   Sexual activity: Not on file  Other Topics Concern   Not on file  Social History Narrative   Not on file   Social Drivers of Health   Financial Resource Strain: Not on file  Food Insecurity: Not on file  Transportation Needs: Not on file  Physical Activity: Not on file  Stress: Not on file  Social Connections: Not on file    Family History  Problem Relation Age of Onset   Diabetes Mother    Hypertension Mother    Heart disease Mother    Hyperlipidemia Mother     Outpatient Encounter  Medications as of 06/18/2024  Medication Sig   albuterol  (VENTOLIN  HFA) 108 (90 Base) MCG/ACT inhaler Inhale 2 puffs into the lungs every 4 (four) hours as needed for wheezing or shortness of breath.   ALPRAZolam (XANAX) 0.5 MG tablet Take 0.5 mg by mouth.   aspirin  EC 81 MG tablet Take 1 tablet (81 mg total) by mouth daily.   Aspirin -Acetaminophen -Caffeine (GOODY HEADACHE PO) Take 1 packet by mouth daily as needed (headaches).   blood glucose meter kit and supplies Dispense based on patient and insurance preference. Use up to four times daily as directed. (FOR ICD-10 E10.9, E11.9).   clopidogrel  (PLAVIX ) 75 MG tablet Take 1 tablet (75 mg total) by mouth daily.   Continuous Glucose Sensor (FREESTYLE LIBRE 3 PLUS SENSOR) MISC Use to monitor glucose continuously as directed. Change sensor every 15 days   escitalopram  (LEXAPRO ) 10 MG tablet Take 10 mg by mouth every evening.   Evolocumab  (REPATHA  SURECLICK) 140 MG/ML SOAJ Inject 140 mg into the skin every 14 (fourteen) days.   ezetimibe  (ZETIA ) 10 MG tablet Take 1 tablet (10 mg total) by mouth daily at 6pm.   famotidine  (PEPCID ) 40 MG tablet Take 40 mg by mouth daily as needed for heartburn.   Insulin  Pen Needle 32G X 4 MM MISC Use with insulin  pen   metolazone  (ZAROXOLYN ) 2.5 MG tablet Take 1 tablet (2.5 mg total) by mouth as directed. Take as directed by the heart failure clinic ONLY   metoprolol  succinate (TOPROL  XL) 25 MG 24 hr tablet Take 1 tablet (25 mg total) by mouth daily.   mexiletine (MEXITIL) 200 MG capsule Take 1 capsule (200 mg total) by mouth 2 (two) times daily.   nitroGLYCERIN  (NITROSTAT ) 0.4 MG SL tablet PLACE 1 TABLET UNDER THE TONGUE AND ALLOW TO DISSOLVE SLOWLY, DO NOT CHEW OR SWALLOW. YOU MAY USE ADDITIONAL TABLETS EVERY 5 MINUTES BUT NO MORE THAN 3 TABLETS.   potassium chloride  SA (KLOR-CON  M) 20 MEQ tablet Take 2 tablets (40 mEq total) by mouth 2 (two) times daily.   promethazine (PHENERGAN) 25 MG tablet promethazine 25 mg  tablet  TAKE ONE TABLET BY MOUTH EVERY 6 HOURS AS NEEDED   RABEprazole (ACIPHEX) 20 MG tablet Take 20 mg by mouth 2 (two) times daily.   Semaglutide , 1 MG/DOSE, 4 MG/3ML SOPN Inject 1 mg as directed once a week.   spironolactone  (ALDACTONE ) 25 MG tablet Take 1 tablet (25 mg total) by mouth daily.   torsemide  (DEMADEX ) 20 MG tablet Take 3 tablets (60 mg total) by mouth 2 (two) times daily.   [DISCONTINUED] JARDIANCE  10 MG TABS tablet Take 1 tablet (10 mg total) by mouth daily before breakfast.   [DISCONTINUED]  metFORMIN  (GLUCOPHAGE ) 500 MG tablet Take 2 tablets (1,000 mg total) by mouth 2 (two) times daily with a meal.   empagliflozin  (JARDIANCE ) 10 MG TABS tablet Take 1 tablet (10 mg total) by mouth daily before breakfast.   metFORMIN  (GLUCOPHAGE ) 500 MG tablet Take 2 tablets (1,000 mg total) by mouth 2 (two) times daily with a meal.   No facility-administered encounter medications on file as of 06/18/2024.    ALLERGIES: Allergies  Allergen Reactions   Esomeprazole Magnesium Anaphylaxis   Ciprofibrate Itching   Ciprofloxacin Itching   Gabapentin Other (See Comments)    Achy - myalgia   Lubiprostone Diarrhea   Statins     Myalgia - flu like symptoms   Amlodipine Rash   Iron Rash and Swelling   Latex Rash and Swelling   Povidone Iodine Rash    VACCINATION STATUS:  There is no immunization history on file for this patient.  Diabetes She presents for her follow-up diabetic visit. She has type 2 diabetes mellitus. Onset time: diagnosed with GD at ate 27 then diabetes at age 60. Her disease course has been stable. There are no hypoglycemic associated symptoms. Associated symptoms include fatigue. There are no hypoglycemic complications. Symptoms are stable. 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 Risk factors for coronary artery disease include tobacco exposure, diabetes mellitus, dyslipidemia, family history, obesity and hypertension. Current diabetic  treatment includes oral agent (dual therapy) (And Ozempic ). She is compliant with treatment most of the time. Her weight is decreasing steadily. She is following a generally healthy diet. Meal planning includes avoidance of concentrated sweets. She has not had a previous visit with a dietitian. She participates in exercise three times a week. Her home blood glucose trend is decreasing steadily. (She presents today with her CGM showing at goal glycemic profile overall.  Her POCT A1c today is 6.6%, improving from last visit of 6.9%. Analysis of her CGM shows TIR 100%, TAR 0%, TBR 0% with a GMI of 6%.   She restarted her Ozempic  1 mg after being off for about a month or so and noted significant nausea but that has resolved.) An ACE inhibitor/angiotensin II receptor blocker is not being taken. She sees a podiatrist.Eye exam is not current (due now).     Review of systems  Constitutional: + steadily decreasing body weight, current Body mass index is 27.62 kg/m., no fatigue, no subjective hyperthermia, no subjective hypothermia Eyes: no blurry vision, no xerophthalmia ENT: no sore throat, no nodules palpated in throat, no dysphagia/odynophagia, no hoarseness Cardiovascular: no chest pain, no shortness of breath, no palpitations, no leg swelling Respiratory: no cough, no shortness of breath Gastrointestinal: no nausea/vomiting/diarrhea, + intermittent constipation(Hx IBS-C) Musculoskeletal: no muscle/joint aches Skin: no rashes, no hyperemia Neurological: no tremors, no numbness, no tingling, no dizziness Psychiatric: no depression, no anxiety  Objective:     BP 104/60 (BP Location: Left Arm, Patient Position: Sitting, Cuff Size: Large)   Pulse 83   Ht 5' 2 (1.575 m)   Wt 151 lb (68.5 kg)   BMI 27.62 kg/m   Wt Readings from Last 3 Encounters:  06/18/24 151 lb (68.5 kg)  05/19/24 150 lb (68 kg)  04/22/24 169 lb 6.4 oz (76.8 kg)     BP Readings from Last 3 Encounters:  06/18/24 104/60   05/19/24 97/76  04/22/24 100/64      Physical Exam- Limited  Constitutional:  Body mass index is 27.62 kg/m. , not in acute distress, normal state  of mind Eyes:  EOMI, no exophthalmos Musculoskeletal: no gross deformities, strength intact in all four extremities, no gross restriction of joint movements Skin:  no rashes, no hyperemia Neurological: no tremor with outstretched hands   Diabetic Foot Exam - Simple   Simple Foot Form Diabetic Foot exam was performed with the following findings: Yes 06/18/2024  2:53 PM  Visual Inspection No deformities, no ulcerations, no other skin breakdown bilaterally: Yes Sensation Testing Intact to touch and monofilament testing bilaterally: Yes Pulse Check Posterior Tibialis and Dorsalis pulse intact bilaterally: Yes Comments     CMP ( most recent) CMP     Component Value Date/Time   NA 146 (H) 06/09/2024 1552   NA 142 11/11/2023 1426   K 4.0 06/09/2024 1552   CL 103 06/09/2024 1552   CO2 28 06/09/2024 1552   GLUCOSE 149 (H) 06/09/2024 1552   BUN 14 06/09/2024 1552   BUN 24 11/11/2023 1426   CREATININE 1.13 (H) 06/09/2024 1552   CALCIUM 9.4 06/09/2024 1552   PROT 7.0 12/28/2022 1154   PROT 6.2 06/21/2020 0922   ALBUMIN 4.2 12/28/2022 1154   ALBUMIN 4.2 06/21/2020 0922   AST 19 12/28/2022 1154   ALT 16 12/28/2022 1154   ALKPHOS 68 12/28/2022 1154   BILITOT 0.7 12/28/2022 1154   BILITOT <0.2 06/21/2020 0922   GFRNONAA 58 (L) 06/09/2024 1552   GFRAA >60 07/26/2020 1208     Diabetic Labs (most recent): Lab Results  Component Value Date   HGBA1C 6.6 (A) 06/18/2024   HGBA1C 6.9 (A) 02/17/2024   HGBA1C 6.2 (A) 09/30/2023     Lipid Panel ( most recent) Lipid Panel     Component Value Date/Time   CHOL 150 12/23/2023 1447   CHOL 113 06/21/2020 0922   TRIG 127 12/23/2023 1447   HDL 35 (L) 12/23/2023 1447   HDL 33 (L) 06/21/2020 0922   CHOLHDL 4.3 12/23/2023 1447   VLDL 25 12/23/2023 1447   LDLCALC 90 12/23/2023 1447    LDLCALC 54 06/21/2020 0922   LABVLDL 26 06/21/2020 0922      Lab Results  Component Value Date   TSH 1.461 11/05/2023   TSH 1.125 12/28/2022           Assessment & Plan:   1) Type 2 diabetes mellitus with hyperglycemia, with long-term current use of insulin  (HCC)  She presents today with her CGM showing at goal glycemic profile overall.  Her POCT A1c today is 6.6%, improving from last visit of 6.9%. Analysis of her CGM shows TIR 100%, TAR 0%, TBR 0% with a GMI of 6%.   She restarted her Ozempic  1 mg after being off for about a month or so and noted significant nausea but that has resolved.  - SHAVANNA FURNARI has currently uncontrolled symptomatic type 2 DM since 53 years of age.   -Recent labs reviewed.  - I had a long discussion with her about the progressive nature of diabetes and the pathology behind its complications. 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 These are all discussed in detail with her.  The following Lifestyle Medicine recommendations according to American College of Lifestyle Medicine [facility]) were discussed and offered to patient and she agrees to start the journey:  A. Whole Foods, Plant-based plate comprising of fruits and vegetables, plant-based proteins, whole-grain carbohydrates was  discussed in detail with the patient.   A list for source of those nutrients were also provided to the patient.  Patient will use only water or unsweetened tea for hydration. B.  The need to stay away from risky substances including alcohol , smoking; obtaining 7 to 9 hours of restorative sleep, at least 150 minutes of moderate intensity exercise weekly, the importance of healthy social connections,  and stress reduction techniques were discussed. C.  A full color page of  Calorie density of various food groups per pound showing  examples of each food groups was provided to the patient.  - Nutritional counseling repeated at each appointment due to patients tendency to fall back in to old habits.  - The patient admits there is a room for improvement in their diet and drink choices. -  Suggestion is made for the patient to avoid simple carbohydrates from their diet including Cakes, Sweet Desserts / Pastries, Ice Cream, Soda (diet and regular), Sweet Tea, Candies, Chips, Cookies, Sweet Pastries, Store Bought Juices, Alcohol  in Excess of 1-2 drinks a day, Artificial Sweeteners, Coffee Creamer, and Sugar-free Products. This will help patient to have stable blood glucose profile and potentially avoid unintended weight gain.   - I encouraged the patient to switch to unprocessed or minimally processed complex starch and increased protein intake (animal or plant source), fruits, and vegetables.   - Patient is advised to stick to a routine mealtimes to eat 3 meals a day and avoid unnecessary snacks (to snack only to correct hypoglycemia).  - I have approached her with the following individualized plan to manage her diabetes and patient agrees:   -She is advised continue Ozempic  1 mg SQ weekly, continue Metformin  1000 mg po twice daily with meals, and continue Jardiance  10 mg po daily (per cardiology recommendations).  I encouraged her to reach out to [employer] patient assistance program to inquire about re-enrollment for the financial assistance for her GLP1.  -she is encouraged to continue monitoring glucose 4 times daily (using her CGM), before meals and before bed, and to call the clinic if she has readings less than 70 or above 300 for 3 tests in a row.  - she is warned not to take insulin  without proper monitoring per orders. - Adjustment parameters are given to her for hypo and hyperglycemia in writing.  She is excellent candidate for incretin therapy with GLP1 given her MI history.  - Specific targets for  A1c; LDL,  HDL, and Triglycerides were discussed with the patient.  2) Blood Pressure /Hypertension:  her blood pressure is controlled to target.   she is advised to continue her current medications as prescribed by cardiology.  3) Lipids/Hyperlipidemia:    Review of her recent lipid panel from 12/23/23 showed controlled LDL at 90.  she is advised to continue Zetia  10 mg daily at bedtime.  She has intolerance to statins.  4)  Weight/Diet:  her Body mass index is 27.62 kg/m.  -  clearly complicating her diabetes care.   she is a candidate for weight loss. I discussed with her the fact that loss of 5 - 10% of her  current body weight will have the most impact on her diabetes management.  Exercise, and detailed carbohydrates information provided  -  detailed on discharge instructions.  5) Chronic Care/Health Maintenance: -she is not on ACEI/ARB or Statin medications and is encouraged to initiate and continue to follow up with Ophthalmology, Dentist, Podiatrist at least yearly or according to recommendations,  and advised to QUIT SMOKING/VAPING altogether . I have recommended yearly flu vaccine and pneumonia vaccine at least every 5 years; moderate intensity exercise for up to 150 minutes weekly; and sleep for at least 7 hours a day.  - she is advised to maintain close follow up with Sofie Lolita DEL, FNP for primary care needs, as well as her other providers for optimal and coordinated care.     I spent  34  minutes in the care of the patient today including review of labs from CMP, Lipids, Thyroid  Function, Hematology (current and previous including abstractions from other facilities); face-to-face time discussing  her blood glucose readings/logs, discussing hypoglycemia and hyperglycemia episodes and symptoms, medications doses, her options of short and long term treatment based on the latest standards of care / guidelines;  discussion about incorporating lifestyle medicine;  and documenting the encounter.  Risk reduction counseling performed per USPSTF guidelines to reduce obesity and cardiovascular risk factors.     Please refer to Patient Instructions for Blood Glucose Monitoring and Insulin /Medications Dosing Guide  in media tab for additional information. Please  also refer to  Patient Self Inventory in the Media  tab for reviewed elements of pertinent patient history.  Lonell CHRISTELLA Rattler participated in the discussions, expressed understanding, and voiced agreement with the above plans.  All questions were answered to her satisfaction. she is encouraged to contact clinic should she have any questions or concerns prior to her return visit.     Follow up plan: - Return in about 6 months (around 12/19/2024) for Diabetes F/U with A1c in office, No previsit labs, Bring meter and logs.  Benton Rio, [facility] [address] [place], [place] [address] Phone: [phone] Fax: [phone]  06/18/2024, 2:55 PM

## 2024-06-29 ENCOUNTER — Ambulatory Visit (HOSPITAL_COMMUNITY)
Admission: RE | Admit: 2024-06-29 | Discharge: 2024-06-29 | Disposition: A | Discharge status: Home / Self Care | Source: Ambulatory Visit | Attending: Cardiology | Admitting: Cardiology

## 2024-06-29 DIAGNOSIS — I5022 Chronic systolic (congestive) heart failure: Secondary | ICD-10-CM | POA: Insufficient documentation

## 2024-06-29 DIAGNOSIS — D508 Other iron deficiency anemias: Secondary | ICD-10-CM | POA: Diagnosis present

## 2024-06-29 LAB — IRON AND TIBC
Iron: 70 ug/dL (ref 28–170)
Saturation Ratios: 15 % (ref 10.4–31.8)
TIBC: 480 ug/dL — ABNORMAL HIGH (ref 250–450)
UIBC: 410 ug/dL

## 2024-06-29 LAB — FERRITIN: Ferritin: 50 ng/mL (ref 11–307)

## 2024-06-30 ENCOUNTER — Other Ambulatory Visit (HOSPITAL_COMMUNITY): Payer: Self-pay | Admitting: Family Medicine

## 2024-06-30 ENCOUNTER — Ambulatory Visit (HOSPITAL_COMMUNITY): Payer: Self-pay | Admitting: Family Medicine

## 2024-06-30 ENCOUNTER — Telehealth (HOSPITAL_COMMUNITY): Payer: Self-pay

## 2024-06-30 ENCOUNTER — Other Ambulatory Visit (HOSPITAL_COMMUNITY): Payer: Self-pay

## 2024-06-30 ENCOUNTER — Telehealth (HOSPITAL_COMMUNITY): Payer: Self-pay | Admitting: Family Medicine

## 2024-06-30 DIAGNOSIS — D509 Iron deficiency anemia, unspecified: Secondary | ICD-10-CM

## 2024-06-30 DIAGNOSIS — I5022 Chronic systolic (congestive) heart failure: Secondary | ICD-10-CM

## 2024-06-30 DIAGNOSIS — D508 Other iron deficiency anemias: Secondary | ICD-10-CM

## 2024-06-30 NOTE — Telephone Encounter (Signed)
 Patient referred to infusion pharmacy team for ambulatory infusion of IV iron.  Insurance - TEFL teacher of care - Site of care: [facility] Dx code - D50.9 IV Iron Therapy - Feraheme 510 mg x 1 Infusion appointments - Scheduling team will schedule patient as soon as possible.    Thank you,  Norton Blush, [name] Pharmacist [facility]

## 2024-06-30 NOTE — Telephone Encounter (Signed)
 Auth Submission: NO AUTH NEEDED Site of care: Site of care: [facility] Payer: [employer] Medication & CPT/J Code(s) submitted: Feraheme (ferumoxytol ) [id number] Diagnosis Code: D50.9 Route of submission (phone, fax, portal):  Phone # Fax # Auth type: Buy/Bill HB Units/visits requested: 510mg  x 1 dose Reference number:  Approval from: 06/30/24 to 10/30/24

## 2024-07-06 ENCOUNTER — Ambulatory Visit (HOSPITAL_COMMUNITY)
Admission: RE | Admit: 2024-07-06 | Discharge: 2024-07-06 | Disposition: A | Discharge status: Home / Self Care | Source: Ambulatory Visit | Attending: Family Medicine | Admitting: Family Medicine

## 2024-07-06 DIAGNOSIS — D509 Iron deficiency anemia, unspecified: Secondary | ICD-10-CM | POA: Diagnosis present

## 2024-07-06 MED ORDER — SODIUM CHLORIDE 0.9 % IV SOLN
510.0000 mg | Freq: Once | INTRAVENOUS | Status: AC
  Administered 2024-07-06: 510 mg via INTRAVENOUS
  Filled 2024-07-06: qty 510

## 2024-07-30 ENCOUNTER — Encounter (HOSPITAL_COMMUNITY): Payer: Self-pay | Admitting: Internal Medicine

## 2024-07-30 ENCOUNTER — Ambulatory Visit (HOSPITAL_COMMUNITY)
Admission: RE | Admit: 2024-07-30 | Discharge: 2024-07-30 | Disposition: A | Discharge status: Home / Self Care | Source: Ambulatory Visit | Attending: Internal Medicine | Admitting: Internal Medicine

## 2024-07-30 ENCOUNTER — Ambulatory Visit (HOSPITAL_COMMUNITY)
Admission: RE | Admit: 2024-07-30 | Discharge: 2024-07-30 | Disposition: A | Discharge status: Home / Self Care | Source: Ambulatory Visit | Attending: Family Medicine | Admitting: Family Medicine

## 2024-07-30 VITALS — BP 110/64 | HR 87 | Ht 62.0 in | Wt 151.0 lb

## 2024-07-30 DIAGNOSIS — I493 Ventricular premature depolarization: Secondary | ICD-10-CM | POA: Diagnosis not present

## 2024-07-30 DIAGNOSIS — I5022 Chronic systolic (congestive) heart failure: Secondary | ICD-10-CM | POA: Diagnosis not present

## 2024-07-30 DIAGNOSIS — Z79899 Other long term (current) drug therapy: Secondary | ICD-10-CM | POA: Diagnosis not present

## 2024-07-30 DIAGNOSIS — G8929 Other chronic pain: Secondary | ICD-10-CM | POA: Diagnosis not present

## 2024-07-30 DIAGNOSIS — Z7984 Long term (current) use of oral hypoglycemic drugs: Secondary | ICD-10-CM | POA: Insufficient documentation

## 2024-07-30 DIAGNOSIS — G4733 Obstructive sleep apnea (adult) (pediatric): Secondary | ICD-10-CM | POA: Insufficient documentation

## 2024-07-30 DIAGNOSIS — Z955 Presence of coronary angioplasty implant and graft: Secondary | ICD-10-CM | POA: Diagnosis not present

## 2024-07-30 DIAGNOSIS — E611 Iron deficiency: Secondary | ICD-10-CM | POA: Insufficient documentation

## 2024-07-30 DIAGNOSIS — Z7985 Long-term (current) use of injectable non-insulin antidiabetic drugs: Secondary | ICD-10-CM | POA: Insufficient documentation

## 2024-07-30 DIAGNOSIS — Z716 Tobacco abuse counseling: Secondary | ICD-10-CM | POA: Diagnosis not present

## 2024-07-30 DIAGNOSIS — E119 Type 2 diabetes mellitus without complications: Secondary | ICD-10-CM | POA: Diagnosis not present

## 2024-07-30 DIAGNOSIS — Z87891 Personal history of nicotine dependence: Secondary | ICD-10-CM

## 2024-07-30 DIAGNOSIS — F17290 Nicotine dependence, other tobacco product, uncomplicated: Secondary | ICD-10-CM | POA: Insufficient documentation

## 2024-07-30 DIAGNOSIS — I110 Hypertensive heart disease with heart failure: Secondary | ICD-10-CM | POA: Diagnosis not present

## 2024-07-30 DIAGNOSIS — I252 Old myocardial infarction: Secondary | ICD-10-CM | POA: Diagnosis not present

## 2024-07-30 DIAGNOSIS — J449 Chronic obstructive pulmonary disease, unspecified: Secondary | ICD-10-CM | POA: Diagnosis not present

## 2024-07-30 DIAGNOSIS — I471 Supraventricular tachycardia, unspecified: Secondary | ICD-10-CM | POA: Insufficient documentation

## 2024-07-30 DIAGNOSIS — E669 Obesity, unspecified: Secondary | ICD-10-CM | POA: Insufficient documentation

## 2024-07-30 DIAGNOSIS — I2510 Atherosclerotic heart disease of native coronary artery without angina pectoris: Secondary | ICD-10-CM | POA: Diagnosis present

## 2024-07-30 DIAGNOSIS — Z6827 Body mass index (BMI) 27.0-27.9, adult: Secondary | ICD-10-CM | POA: Diagnosis not present

## 2024-07-30 DIAGNOSIS — Z794 Long term (current) use of insulin: Secondary | ICD-10-CM | POA: Insufficient documentation

## 2024-07-30 DIAGNOSIS — R0989 Other specified symptoms and signs involving the circulatory and respiratory systems: Secondary | ICD-10-CM | POA: Insufficient documentation

## 2024-07-30 LAB — BRAIN NATRIURETIC PEPTIDE: B Natriuretic Peptide: 302.4 pg/mL — ABNORMAL HIGH (ref 0.0–100.0)

## 2024-07-30 LAB — ECHOCARDIOGRAM COMPLETE
AR max vel: 2.13 cm2
AV Area VTI: 1.94 cm2
AV Area mean vel: 1.88 cm2
AV Mean grad: 3 mmHg
AV Peak grad: 4.2 mmHg
Ao pk vel: 1.02 m/s
Area-P 1/2: 4.06 cm2
Calc EF: 18.9 %
S' Lateral: 6.2 cm
Single Plane A2C EF: 14.4 %
Single Plane A4C EF: 23.7 %

## 2024-07-30 LAB — BASIC METABOLIC PANEL WITH GFR
Anion gap: 12 (ref 5–15)
BUN: 18 mg/dL (ref 6–20)
CO2: 26 mmol/L (ref 22–32)
Calcium: 10.4 mg/dL — ABNORMAL HIGH (ref 8.9–10.3)
Chloride: 103 mmol/L (ref 98–111)
Creatinine, Ser: 1.12 mg/dL — ABNORMAL HIGH (ref 0.44–1.00)
GFR, Estimated: 59 mL/min — ABNORMAL LOW (ref >60)
Glucose, Bld: 109 mg/dL — ABNORMAL HIGH (ref 70–99)
Potassium: 4.1 mmol/L (ref 3.5–5.1)
Sodium: 141 mmol/L (ref 135–145)

## 2024-07-30 LAB — CBC
HCT: 48 % — ABNORMAL HIGH (ref 36.0–46.0)
Hemoglobin: 14.9 g/dL (ref 12.0–15.0)
MCH: 26.8 pg (ref 26.0–34.0)
MCHC: 31 g/dL (ref 30.0–36.0)
MCV: 86.2 fL (ref 80.0–100.0)
Platelets: 258 K/uL (ref 150–400)
RBC: 5.57 MIL/uL — ABNORMAL HIGH (ref 3.87–5.11)
RDW: 21.1 % — ABNORMAL HIGH (ref 11.5–15.5)
WBC: 10.2 K/uL (ref 4.0–10.5)
nRBC: 0 % (ref 0.0–0.2)

## 2024-07-30 MED ORDER — PERFLUTREN LIPID MICROSPHERE
1.0000 mL | INTRAVENOUS | Status: DC | PRN
  Administered 2024-07-30: 2 mL via INTRAVENOUS

## 2024-07-30 MED ORDER — LOSARTAN POTASSIUM 25 MG PO TABS: 12.5000 mg | ORAL_TABLET | Freq: Every day | ORAL | 3 refills | Status: DC

## 2024-07-30 NOTE — Patient Instructions (Signed)
 START Losartan  12.5 mg ( 1/2 Tab) daily.  Labs done today, your results will be available in MyChart, we will contact you for abnormal readings.  You have been referred to Cardiac Rehab at [facility].  Your physician recommends that you schedule a follow-up appointment as scheduled.  If you have any questions or concerns before your next appointment please send us  a message through [place] or call our office at [phone].    TO LEAVE A MESSAGE FOR THE NURSE SELECT OPTION 2, PLEASE LEAVE A MESSAGE INCLUDING: YOUR NAME DATE OF BIRTH CALL BACK NUMBER REASON FOR CALL**this is important as we prioritize the call backs  YOU WILL RECEIVE A CALL BACK THE SAME DAY AS LONG AS YOU CALL BEFORE 4:00 PM  At the [facility], you and your health needs are our priority. As part of our continuing mission to provide you with exceptional heart care, we have created designated Provider Care Teams. These Care Teams include your primary Cardiologist (physician) and Advanced Practice Providers (APPs- Physician Assistants and Nurse Practitioners) who all work together to provide you with the care you need, when you need it.   You may see any of the following providers on your designated Care Team at your next follow up: Dr Toribio Fuel Dr Ezra Shuck Dr. Ria Commander Dr. Morene Brownie Amy Lenetta, NP [name], [name] [facility] [place], [name] [name], NP [place] Lee, NP Ellouise Class, NP [name], [name] Jaun Bash, [name]   Please be sure to bring in all your medications bottles to every appointment.    Thank you for choosing [facility]

## 2024-07-30 NOTE — Progress Notes (Signed)
 [facility] Note  Date:  07/30/24  ID:  Julie Hernandez, DOB 05-24-1971, MRN [id number]  Location: Home  Type of Visit: Established patient  PCP:  Julie Lolita DEL, FNP  EP: Dr. Waddell HF Cardiologist: Dr. Cherrie   HPI: Julie Hernandez is a 53 y.o. woman with COPD/ongoing tobacco abuse (vaping), premature CAD s/p anterior MI, diet-controlled DM2, chronic back pain, and systolic HF due to [facility] referred for further evaluation of CAD and systolic HF.  Had anterior MI in 2010. LAD stent at [facility]. Went to [facility] that same year and had  a second stent placed. Did well from a cardiac perspective until 3/21.   Cath 02/08/20 at [facility] for NSTEMI. 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 Was told she may need repeat stenting vs CABG. Referred here for second opinion.   We saw her in 5/21 with daily angina. Underwent MRI which showed EF 26% only minimal viability in anterior wall. Decision to proceed with PCI of LCX and LAD (ISR) over CABG. Had successful PCI of LCX and LAD with Dr. Wonda.  Echo 9/21 EF 60-65%.  Echo 04/06/21 EF 50-55% mild anterior and apical HK  Admitted 10/23 with NSTEMI. Echo EF back down to 20% and RV mildly reduced. Underwent R/LHC which showed elevated right and left filling pressures and low CO;  occluded LAD stent and new CTO of RCA with L>>R collaterals. Underwent staged PCI with DES to mid LAD.   Seen in ED 12/25/22 with SVT  Echo 01/11/23 EF 25-30% with WMA c/w previous LAD infarct Sleep 01/08/23 Moderate OSA 20.6 CSA 1.6  cMRI 8/24 showed LVEF 26%, RVEF 39%, large anterior infarct--> referred to EP for ICD.  Zio 12/24:  Mostly NSR, but 25 runs of NSVT and 11.8% PVC burden. Started on Mexilitine  S/p ICD insertion 1/25.    Today she returns for HF follow up. Feels better after getting 2 doses of iron. Not  overly active but can do ADLs without too much problem. Rare CP. No edema, orthopnea or PND. Compliant with meds. Continues to vape a few times per day (cutting down)   Echo today 07/30/24:  EF 25% markedly dilated. Mild RV dysfunction   ICD interrogation: HL score 1 volume ok. No VT/AF. Activity 1.3hr/day Personally reviewed     Cardiac Studies - LHC 12/24: Patent left main with mild nonobstructive plaquing.  Patent LAD with moderate to severe focal in-stent restenosis in the distal stented segment, estimated at 70%. Widely patent circumflex stent. Totally occluded RCA with left-to-right collaterals, unchanged from the previous cath study.   - cMRI (8/24): showed LVEF 26%, RVEF 39%, large anterior infarct  - R/LHC (10/23): RA 23 mean, PA 48/35 mean 39, PCWP mean 30, CO/CI (Fick) 3.7/2 Severe 2v CAD with 90% in stent re-stenonsis of mid LAD and CTO of mid RCA with L>>R collaterals  - Echo (10/23): EF 20%, RV mildly down.  - Echo (5/22): EF 50-55%, mid anterior and apical HK  - Echo (9/21): EF 60-65%  - Zio (7/21) 1. Sinus rhythm - avg HR of 82 2. 13 Ventricular Tachycardia runs occurred, the run with the fastest interval lasting 12 beats with a max rate of 139 bpm (avg 110 bpm); the run with the fastest interval was also the longest 3. Very frequents PVCs (32.2%, [id number]), VE  Couplets were rare (<1.0%, 2118),  4. Ventricular Bigeminy and Trigeminy were present. 5.. Multiple patient-triggered events associated  - cMRI (03/29/20) 1. Subendocardial late gadolinium enhancement consistent with prior infarct in the LAD territory. There is >50% transmural LGE suggesting nonviability in the basal to apical anterior wall and apex. There is <50% transmural LGE suggesting viability in the basal to mid anteroseptum and apical septum 2.  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 Normal LV size with severe systolic dysfunction (EF 26%). Akinesis of basal to apical anterior/anteroseptal walls and  apex 4.  Small RV size with normal systolic function (EF 62%)  Past Medical History:  Diagnosis Date   CHF (congestive heart failure) (HCC)    Coronary artery disease    COVID 12/03/2020   Diabetes mellitus without complication (HCC)    Hypertension    OSA on CPAP    moderate obstructive sleep apnea with an AHI of 20.6/h and he was placed on auto CPAP from 4 to 15 cm H2O   Current Outpatient Medications  Medication Sig Dispense Refill   albuterol  (VENTOLIN  HFA) 108 (90 Base) MCG/ACT inhaler Inhale 2 puffs into the lungs every 4 (four) hours as needed for wheezing or shortness of breath.     ALPRAZolam (XANAX) 0.5 MG tablet Take 0.5 mg by mouth.     aspirin  EC 81 MG tablet Take 1 tablet (81 mg total) by mouth daily. 30 tablet 11   Aspirin -Acetaminophen -Caffeine (GOODY HEADACHE PO) Take 1 packet by mouth daily as needed (headaches).     blood glucose meter kit and supplies Dispense based on patient and insurance preference. Use up to four times daily as directed. (FOR ICD-10 E10.9, E11.9). 1 each 1   clopidogrel  (PLAVIX ) 75 MG tablet Take 1 tablet (75 mg total) by mouth daily. 30 tablet 11   Continuous Glucose Sensor (FREESTYLE LIBRE 3 PLUS SENSOR) MISC Use to monitor glucose continuously as directed. Change sensor every 15 days 6 each 3   empagliflozin  (JARDIANCE ) 10 MG TABS tablet Take 1 tablet (10 mg total) by mouth daily before breakfast. 90 tablet 3   escitalopram  (LEXAPRO ) 10 MG tablet Take 10 mg by mouth every evening.     Evolocumab  (REPATHA  SURECLICK) 140 MG/ML SOAJ Inject 140 mg into the skin every 14 (fourteen) days. 6 mL 3   ezetimibe  (ZETIA ) 10 MG tablet Take 1 tablet (10 mg total) by mouth daily at 6pm. 30 tablet 11   famotidine  (PEPCID ) 40 MG tablet Take 40 mg by mouth daily as needed for heartburn.     Insulin  Pen Needle 32G X 4 MM MISC Use with insulin  pen 100 each 0   metFORMIN  (GLUCOPHAGE ) 500 MG tablet Take 2 tablets (1,000 mg total) by mouth 2 (two) times daily with a  meal. 360 tablet 3   metolazone  (ZAROXOLYN ) 2.5 MG tablet Take 1 tablet (2.5 mg total) by mouth as directed. Take as directed by the heart failure clinic ONLY 6 tablet 0   metoprolol  succinate (TOPROL  XL) 25 MG 24 hr tablet Take 1 tablet (25 mg total) by mouth daily. 30 tablet 11   mexiletine (MEXITIL) 200 MG capsule Take 1 capsule (200 mg total) by mouth 2 (two) times daily. 60 capsule 11   nitroGLYCERIN  (NITROSTAT ) 0.4 MG SL tablet PLACE 1 TABLET UNDER THE TONGUE AND ALLOW TO DISSOLVE SLOWLY, DO NOT CHEW OR SWALLOW. YOU MAY USE ADDITIONAL TABLETS EVERY 5 MINUTES BUT NO MORE THAN 3 TABLETS. 25 tablet 1   potassium chloride   SA (KLOR-CON  M) 20 MEQ tablet Take 2 tablets (40 mEq total) by mouth 2 (two) times daily.     promethazine (PHENERGAN) 25 MG tablet promethazine 25 mg tablet  TAKE ONE TABLET BY MOUTH EVERY 6 HOURS AS NEEDED     RABEprazole (ACIPHEX) 20 MG tablet Take 20 mg by mouth 2 (two) times daily.     Semaglutide , 1 MG/DOSE, 4 MG/3ML SOPN Inject 1 mg as directed once a week. 9 mL 1   spironolactone  (ALDACTONE ) 25 MG tablet Take 1 tablet (25 mg total) by mouth daily. 30 tablet 11   torsemide  (DEMADEX ) 20 MG tablet Take 3 tablets (60 mg total) by mouth 2 (two) times daily.     No current facility-administered medications for this encounter.   Facility-Administered Medications Ordered in Other Encounters  Medication Dose Route Frequency Provider Last Rate Last Admin   perflutren  lipid microspheres (DEFINITY ) IV suspension  1-10 mL Intravenous PRN Adhya Cocco, Toribio SAUNDERS, MD   2 mL at 07/30/24 1355   Allergies  Allergen Reactions   Esomeprazole Magnesium Anaphylaxis   Ciprofibrate Itching   Ciprofloxacin Itching   Gabapentin Other (See Comments)    Achy - myalgia   Lubiprostone Diarrhea   Statins     Myalgia - flu like symptoms   Amlodipine Rash   Iron Rash and Swelling   Latex Rash and Swelling   Povidone Iodine Rash   Social History   Socioeconomic History   Marital status:  Divorced    Spouse name: Not on file   Number of children: 2   Years of education: Not on file   Highest education level: Not on file  Occupational History   Occupation: On disability  Tobacco Use   Smoking status: Former    Current packs/day: 0.00    Types: Cigarettes    Quit date: 02/18/2020    Years since quitting: 4.4   Smokeless tobacco: Never  Vaping Use   Vaping status: Some Days  Substance and Sexual Activity   Alcohol  use: Yes    Comment: SOCIAL   Drug use: Never   Sexual activity: Not on file  Other Topics Concern   Not on file  Social History Narrative   Not on file   Social Drivers of Health   Financial Resource Strain: Not on file  Food Insecurity: Not on file  Transportation Needs: Not on file  Physical Activity: Not on file  Stress: Not on file  Social Connections: Not on file  Intimate Partner Violence: Not on file   FHx: M died at 71 from MI and HF D had stroke No brother and sisters  BP 110/64   Pulse 87   Ht 5' 2 (1.575 m)   Wt 68.5 kg (151 lb)   SpO2 99%   BMI 27.62 kg/m   Wt Readings from Last 3 Encounters:  07/30/24 68.5 kg (151 lb)  06/18/24 68.5 kg (151 lb)  05/19/24 68 kg (150 lb)   PHYSICAL EXAM General:  Well appearing. No resp difficulty HEENT: normal Neck: supple. no JVD. Carotids 2+ bilat; no bruits. No lymphadenopathy or thryomegaly appreciated. Cor: PMI nondisplaced. Regular rate & rhythm. No rubs, gallops or murmurs. Lungs: clear but decreased Abdomen: soft, nontender, nondistended. No hepatosplenomegaly. No bruits or masses. Good bowel sounds. Extremities: no cyanosis, clubbing, rash, edema Neuro: alert & orientedx3, cranial nerves grossly intact. moves all 4 extremities w/o difficulty. Affect pleasant  ICD interrogation (personally reviewed): as per HPI  ReDs reading: n/a  ASSESSMENT & PLAN: 1. CAD - prior anterior MI w/ h/o PCI to mLAD and mLCX - Cath (10/23): 90% in-stent restenosis of mLAD and CTO mRCA with  left-to-right collaterals. LCx stent widely patent. -> S/P PCI DES mid LAD. - LHC 12/24: Patent left main with mild nonobstructive plaquing.  Patent LAD with moderate to severe focal in-stent restenosis in the distal stented segment, estimated at 70%. Widely patent circumflex stent. Totally occluded RCA with left-to-right collaterals, unchanged from the previous cath study. Plan for medical therapy for now.  - No significant s/s angina  - Lipids managed by [facility] Clinic. LDL 90 2/25  2. 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 - Echo today 07/30/24:  EF 25% markedly dilated. Mild RV dysfunction  - NYHA III volume ok on exam and ICD - Continue Toprol  XL 25 mg daily. - Continue spironolactone  25 mg daily.   - Continue Jardiance . - Stat losartan  12.5 - CPX test next visit to risk stratify for advanced thrapies.  - Refer to CR  3. SVT/PVCs - Zio 12/24:  Mostly NSR, but 25 runs of NSVT and 11.8% PVC burden. Started on Mexilitine - Continue mexiletine 200 mg bid - None on ECG today - Continue CPAP, tries to use it daily  4. DM2 - improving control - Hgb A1c 13.1 (11/23)--> 8.5 (1/24)-> 6.2 (11/24) - Followed by PCP/ENDO - Continue SGLT2i and Ozempic    5. HL w/ LDL Goal < 55 - Statin intolerant. - Continue Repatha  + Zetia  - Followed by Lipid Clinic - No change   6. H/o Tobacco Use - Quit cigarettes, currently vapes  - She is cutting back - Discussed need for complete cessation  7. Obesity - Body mass index is 27.62 kg/m. - Continue Ozempic   8. OSA - Sleep study 01/08/23 Moderate OSA 20.6 CSA 1.6 -  Continue CPAP  - Follows with Dr. Shlomo - no change  9. Carotid bruit - Carotid u/s with no significant dz  10. Iron deficiency - s/p IV iron  - check labs today    Patriciann Becht, MD  3:35 PM

## 2024-08-03 ENCOUNTER — Telehealth (HOSPITAL_COMMUNITY): Payer: Self-pay

## 2024-08-03 NOTE — Telephone Encounter (Signed)
 Referral for cardiac rehab faxed to [facility] on 08/03/2022

## 2024-08-20 ENCOUNTER — Other Ambulatory Visit (HOSPITAL_COMMUNITY): Payer: Self-pay

## 2024-08-20 DIAGNOSIS — I5022 Chronic systolic (congestive) heart failure: Secondary | ICD-10-CM

## 2024-08-25 ENCOUNTER — Ambulatory Visit: Payer: PPO

## 2024-08-25 DIAGNOSIS — I5022 Chronic systolic (congestive) heart failure: Secondary | ICD-10-CM | POA: Diagnosis not present

## 2024-08-26 LAB — CUP PACEART REMOTE DEVICE CHECK
Battery Remaining Longevity: 162 mo
Battery Remaining Percentage: 100 %
Brady Statistic RV Percent Paced: 0 %
Date Time Interrogation Session: 20251008084800
HighPow Impedance: 73 Ohm
Implantable Lead Connection Status: 753985
Implantable Lead Implant Date: 20250106
Implantable Lead Location: 753860
Implantable Lead Model: 137
Implantable Lead Serial Number: 301618
Implantable Pulse Generator Implant Date: 20250106
Lead Channel Impedance Value: 474 Ohm
Lead Channel Pacing Threshold Amplitude: 0.8 V
Lead Channel Pacing Threshold Pulse Width: 0.4 ms
Lead Channel Setting Pacing Amplitude: 2.5 V
Lead Channel Setting Pacing Pulse Width: 0.4 ms
Lead Channel Setting Sensing Sensitivity: 0.5 mV
Pulse Gen Serial Number: 222540

## 2024-08-27 ENCOUNTER — Other Ambulatory Visit (HOSPITAL_COMMUNITY): Payer: Self-pay | Admitting: Cardiology

## 2024-08-27 ENCOUNTER — Ambulatory Visit: Payer: Self-pay | Admitting: Internal Medicine

## 2024-08-27 MED ORDER — TORSEMIDE 20 MG PO TABS: 60.0000 mg | ORAL_TABLET | Freq: Two times a day (BID) | ORAL | 2 refills | Status: DC

## 2024-08-28 NOTE — Progress Notes (Signed)
 Remote ICD Transmission

## 2024-08-31 ENCOUNTER — Other Ambulatory Visit (HOSPITAL_COMMUNITY): Payer: Self-pay

## 2024-09-15 ENCOUNTER — Telehealth (HOSPITAL_COMMUNITY): Payer: Self-pay | Admitting: Cardiology

## 2024-09-15 DIAGNOSIS — I5022 Chronic systolic (congestive) heart failure: Secondary | ICD-10-CM

## 2024-09-15 NOTE — Telephone Encounter (Signed)
 Patient called to report she has been taking 25 mg of losartan  vs 12.5 mg as ordered.    Ok to continue current dose?  Reports b/p as stable, doing well in cardiac rehab, no complaints

## 2024-09-16 MED ORDER — LOSARTAN POTASSIUM 25 MG PO TABS: 25.0000 mg | ORAL_TABLET | Freq: Every day | ORAL | 3 refills | Status: AC

## 2024-09-16 NOTE — Telephone Encounter (Signed)
 Pt aware.

## 2024-09-23 ENCOUNTER — Ambulatory Visit (HOSPITAL_COMMUNITY): Payer: Self-pay | Admitting: Family Medicine

## 2024-09-23 ENCOUNTER — Ambulatory Visit (HOSPITAL_COMMUNITY)
Admission: RE | Admit: 2024-09-23 | Discharge: 2024-09-23 | Disposition: A | Discharge status: Home / Self Care | Source: Ambulatory Visit | Attending: Cardiology | Admitting: Cardiology

## 2024-09-23 DIAGNOSIS — I5022 Chronic systolic (congestive) heart failure: Secondary | ICD-10-CM | POA: Diagnosis present

## 2024-09-23 LAB — BASIC METABOLIC PANEL WITH GFR
Anion gap: 17 — ABNORMAL HIGH (ref 5–15)
BUN: 21 mg/dL — ABNORMAL HIGH (ref 6–20)
CO2: 26 mmol/L (ref 22–32)
Calcium: 9 mg/dL (ref 8.9–10.3)
Chloride: 94 mmol/L — ABNORMAL LOW (ref 98–111)
Creatinine, Ser: 1.06 mg/dL — ABNORMAL HIGH (ref 0.44–1.00)
GFR, Estimated: >60 mL/min (ref >60)
Glucose, Bld: 155 mg/dL — ABNORMAL HIGH (ref 70–99)
Potassium: 3.5 mmol/L (ref 3.5–5.1)
Sodium: 137 mmol/L (ref 135–145)

## 2024-09-29 ENCOUNTER — Other Ambulatory Visit (HOSPITAL_COMMUNITY): Payer: Self-pay | Admitting: Internal Medicine

## 2024-10-29 ENCOUNTER — Ambulatory Visit (HOSPITAL_COMMUNITY)
Admission: RE | Admit: 2024-10-29 | Discharge: 2024-10-29 | Disposition: A | Discharge status: Home / Self Care | Source: Ambulatory Visit | Attending: Cardiology | Admitting: Cardiology

## 2024-10-29 VITALS — BP 90/70 | HR 86 | Ht 62.0 in | Wt 151.8 lb

## 2024-10-29 DIAGNOSIS — I5022 Chronic systolic (congestive) heart failure: Secondary | ICD-10-CM | POA: Diagnosis not present

## 2024-10-29 LAB — BASIC METABOLIC PANEL WITH GFR
Anion gap: 17 — ABNORMAL HIGH (ref 5–15)
BUN: 13 mg/dL (ref 6–20)
CO2: 26 mmol/L (ref 22–32)
Calcium: 9.8 mg/dL (ref 8.9–10.3)
Chloride: 100 mmol/L (ref 98–111)
Creatinine, Ser: 1.16 mg/dL — ABNORMAL HIGH (ref 0.44–1.00)
GFR, Estimated: 56 mL/min — ABNORMAL LOW (ref >60)
Glucose, Bld: 117 mg/dL — ABNORMAL HIGH (ref 70–99)
Potassium: 4.1 mmol/L (ref 3.5–5.1)
Sodium: 143 mmol/L (ref 135–145)

## 2024-10-29 LAB — CBC
HCT: 47.8 % — ABNORMAL HIGH (ref 36.0–46.0)
Hemoglobin: 15.5 g/dL — ABNORMAL HIGH (ref 12.0–15.0)
MCH: 30 pg (ref 26.0–34.0)
MCHC: 32.4 g/dL (ref 30.0–36.0)
MCV: 92.6 fL (ref 80.0–100.0)
Platelets: 308 K/uL (ref 150–400)
RBC: 5.16 MIL/uL — ABNORMAL HIGH (ref 3.87–5.11)
RDW: 12.9 % (ref 11.5–15.5)
WBC: 9.6 K/uL (ref 4.0–10.5)
nRBC: 0 % (ref 0.0–0.2)

## 2024-10-29 LAB — IRON AND TIBC
Iron: 82 ug/dL (ref 28–170)
Saturation Ratios: 22 % (ref 10.4–31.8)
TIBC: 381 ug/dL (ref 250–450)
UIBC: 299 ug/dL

## 2024-10-29 LAB — FERRITIN: Ferritin: 81 ng/mL (ref 11–307)

## 2024-10-29 NOTE — Patient Instructions (Addendum)
 No change in medications. Labs today - will call you if abnormal. CPX has been ordered for you in one month - see below.  Return to [facility] Failure [facility] in 6 weeks - see below. Please call us  at [phone] if any questions or concerns prior to your next appointment.    Your physician has recommended that you have a cardiopulmonary stress test (CPX). CPX testing is a non-invasive measurement of heart and lung function. It replaces a traditional treadmill stress test. This type of test provides a tremendous amount of information that relates not only to your present condition but also for future outcomes. This test combines measurements of you ventilation, respiratory gas exchange in the lungs, electrocardiogram (EKG), blood pressure and physical response before, during, and following an exercise protocol.

## 2024-10-29 NOTE — Progress Notes (Addendum)
 [facility] Note  Date:  10/29/2024  ID:  Julie Hernandez, DOB 07-Nov-1971, MRN [id number]  Location: Home  Type of Visit: Established patient  PCP:  Sofie Lolita DEL, FNP  EP: Dr. Waddell HF Cardiologist: Dr. Cherrie   HPI: Julie Hernandez is a 53 y.o. 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  Had anterior MI in 2010. LAD stent at [facility]. Went to [facility] that same year and had  a second stent placed. Did well from a cardiac perspective until 3/21.   Cath 02/08/20 at [facility] for NSTEMI. 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 Was told she may need repeat stenting vs CABG. Referred here for second opinion.   We saw her in 5/21 with daily angina. Underwent MRI which showed EF 26% only minimal viability in anterior wall. Decision to proceed with PCI of LCX and LAD (ISR) over CABG. Had successful PCI of LCX and LAD with Dr. Wonda.  Echo 9/21 EF 60-65%.  Echo 04/06/21 EF 50-55% mild anterior and apical HK  Admitted 10/23 with NSTEMI. Echo EF back down to 20% and RV mildly reduced. Underwent R/LHC which showed elevated right and left filling pressures and low CO;  occluded LAD stent and new CTO of RCA with L>>R collaterals. Underwent staged PCI with DES to mid LAD.   Seen in ED 12/25/22 with SVT  Echo 01/11/23 EF 25-30% with WMA c/w previous LAD infarct Sleep 01/08/23 Moderate OSA 20.6 CSA 1.6  cMRI 8/24 showed LVEF 26%, RVEF 39%, large anterior infarct--> referred to EP for ICD.  Zio 12/24:  Mostly NSR, but 25 runs of NSVT and 11.8% PVC burden. Started on Mexilitine  S/p ICD insertion 1/25.    Echo 07/30/24:  EF 25% markedly dilated. Mild RV dysfunction   She presents today  for f/u. She complains of exertional fatigue and dyspnea, NYHA III. Device interrogation shows normal impedence. HL Score 3. No VT. Activity level 1.1 hr/day. She did not tolerate losartan . BP too low and had positional dizziness. This resolved after stopping. No chest pain. BP today 90/70. She also feels her iron is low. Has prior h/o IDA.   ICD interrogation: HL score 6 volume ok. No VT/AF. Activity 1.1hr/day Personally reviewed   Cardiac Studies - LHC 12/24: Patent left main with mild nonobstructive plaquing.  Patent LAD with moderate to severe focal in-stent restenosis in the distal stented segment, estimated at 70%. Widely patent circumflex stent. Totally occluded RCA with left-to-right collaterals, unchanged from the previous cath study.   - cMRI (8/24): showed LVEF 26%, RVEF 39%, large anterior infarct  - R/LHC (10/23): RA 23 mean, PA 48/35 mean 39, PCWP mean 30, CO/CI (Fick) 3.7/2 Severe 2v CAD with 90% in stent re-stenonsis of mid LAD and CTO of mid RCA with L>>R collaterals  - Echo (10/23): EF 20%, RV mildly down.  - Echo (5/22): EF 50-55%, mid anterior and apical HK  - Echo (9/21): EF 60-65%  - Zio (7/21) 1. Sinus rhythm - avg HR of 82 2. 13 Ventricular Tachycardia runs occurred, the run with the fastest interval lasting 12 beats with a max rate of 139 bpm (avg 110 bpm); the run with the fastest  interval was also the longest 3. Very frequents PVCs (32.2%, [id number]), VE Couplets were rare (<1.0%, 2118),  4. Ventricular Bigeminy and Trigeminy were present. 5.. Multiple patient-triggered events associated  - cMRI (03/29/20) 1. Subendocardial late gadolinium enhancement consistent with prior infarct in the LAD territory. There is >50% transmural LGE suggesting nonviability in the basal to apical anterior wall and apex. There is <50% transmural LGE suggesting viability in the basal to mid anteroseptum and apical septum 2.  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 Normal LV size  with severe systolic dysfunction (EF 26%). Akinesis of basal to apical anterior/anteroseptal walls and apex 4.  Small RV size with normal systolic function (EF 62%)  Past Medical History:  Diagnosis Date   CHF (congestive heart failure) (HCC)    Coronary artery disease    COVID 12/03/2020   Diabetes mellitus without complication (HCC)    Hypertension    OSA on CPAP    moderate obstructive sleep apnea with an AHI of 20.6/h and he was placed on auto CPAP from 4 to 15 cm H2O   Current Outpatient Medications  Medication Sig Dispense Refill   albuterol  (VENTOLIN  HFA) 108 (90 Base) MCG/ACT inhaler Inhale 2 puffs into the lungs every 4 (four) hours as needed for wheezing or shortness of breath.     aspirin  EC 81 MG tablet Take 1 tablet (81 mg total) by mouth daily. 30 tablet 11   Aspirin -Acetaminophen -Caffeine (GOODY HEADACHE PO) Take 1 packet by mouth daily as needed (headaches).     blood glucose meter kit and supplies Dispense based on patient and insurance preference. Use up to four times daily as directed. (FOR ICD-10 E10.9, E11.9). 1 each 1   clopidogrel  (PLAVIX ) 75 MG tablet Take 1 tablet (75 mg total) by mouth daily. 30 tablet 11   Continuous Glucose Sensor (FREESTYLE LIBRE 3 PLUS SENSOR) MISC Use to monitor glucose continuously as directed. Change sensor every 15 days 6 each 3   empagliflozin  (JARDIANCE ) 10 MG TABS tablet Take 1 tablet (10 mg total) by mouth daily before breakfast. 90 tablet 3   escitalopram  (LEXAPRO ) 10 MG tablet Take 10 mg by mouth every evening.     Evolocumab  (REPATHA  SURECLICK) 140 MG/ML SOAJ Inject 140 mg into the skin every 14 (fourteen) days. 6 mL 3   ezetimibe  (ZETIA ) 10 MG tablet Take 1 tablet (10 mg total) by mouth daily at 6pm. 30 tablet 11   famotidine  (PEPCID ) 40 MG tablet Take 40 mg by mouth daily as needed for heartburn.     Insulin  Pen Needle 32G X 4 MM MISC Use with insulin  pen 100 each 0   losartan  (COZAAR ) 25 MG tablet Take 1 tablet (25 mg total) by  mouth daily. 90 tablet 3   metFORMIN  (GLUCOPHAGE ) 500 MG tablet Take 2 tablets (1,000 mg total) by mouth 2 (two) times daily with a meal. 360 tablet 3   metolazone  (ZAROXOLYN ) 2.5 MG tablet Take 1 tablet (2.5 mg total) by mouth as directed. Take as directed by the heart failure clinic ONLY 6 tablet 0   metoprolol  succinate (TOPROL  XL) 25 MG 24 hr tablet Take 1 tablet (25 mg total) by mouth daily. 30 tablet 11   mexiletine (MEXITIL) 200 MG capsule Take 1 capsule (200 mg total) by mouth 2 (two) times daily. 60 capsule 11   nitroGLYCERIN  (NITROSTAT ) 0.4 MG SL tablet PLACE 1 TABLET UNDER THE TONGUE AND ALLOW TO DISSOLVE SLOWLY, DO NOT CHEW OR SWALLOW. MAY TAKE AN ADDITIONAL  TABLET EVERY 5 MINUTES **MAX 3 TABLETS** 25 tablet 1   potassium chloride  SA (KLOR-CON  M) 20 MEQ tablet Take 2 tablets (40 mEq total) by mouth 2 (two) times daily.     promethazine (PHENERGAN) 25 MG tablet promethazine 25 mg tablet  TAKE ONE TABLET BY MOUTH EVERY 6 HOURS AS NEEDED     RABEprazole (ACIPHEX) 20 MG tablet Take 20 mg by mouth 2 (two) times daily.     Semaglutide , 1 MG/DOSE, 4 MG/3ML SOPN Inject 1 mg as directed once a week. 9 mL 1   spironolactone  (ALDACTONE ) 25 MG tablet Take 1 tablet (25 mg total) by mouth daily. 30 tablet 11   torsemide  (DEMADEX ) 20 MG tablet Take 3 tablets (60 mg total) by mouth 2 (two) times daily. 180 tablet 2   No current facility-administered medications for this visit.   Allergies  Allergen Reactions   Esomeprazole Magnesium Anaphylaxis   Ciprofibrate Itching   Ciprofloxacin Itching   Gabapentin Other (See Comments)    Achy - myalgia   Lubiprostone Diarrhea   Statins     Myalgia - flu like symptoms   Amlodipine Rash   Iron Rash and Swelling   Latex Rash and Swelling   Povidone Iodine Rash   Social History   Socioeconomic History   Marital status: Divorced    Spouse name: Not on file   Number of children: 2   Years of education: Not on file   Highest education level: Not on  file  Occupational History   Occupation: On disability  Tobacco Use   Smoking status: Former    Current packs/day: 0.00    Types: Cigarettes    Quit date: 02/18/2020    Years since quitting: 4.6   Smokeless tobacco: Never  Vaping Use   Vaping status: Some Days  Substance and Sexual Activity   Alcohol  use: Yes    Comment: SOCIAL   Drug use: Never   Sexual activity: Not on file  Other Topics Concern   Not on file  Social History Narrative   Not on file   Social Drivers of Health   Tobacco Use: Medium Risk (07/30/2024)   Patient History    Smoking Tobacco Use: Former    Smokeless Tobacco Use: Never    Passive Exposure: Not on Actuary Strain: Not on file  Food Insecurity: Not on file  Transportation Needs: Not on file  Physical Activity: Not on file  Stress: Not on file  Social Connections: Not on file  Intimate Partner Violence: Not on file  Depression (PHQ2-9): Low Risk (10/31/2022)   Depression (PHQ2-9)    PHQ-2 Score: 0  Alcohol  Screen: Not on file  Housing: Not on file  Utilities: Not on file  Health Literacy: Not on file   FHx: M died at 49 from MI and HF D had stroke No brother and sisters  There were no vitals taken for this visit.  Wt Readings from Last 3 Encounters:  07/30/24 68.5 kg (151 lb)  06/18/24 68.5 kg (151 lb)  05/19/24 68 kg (150 lb)   Physical Exam  GENERAL: NAD Lungs: clear  CARDIAC:  JVP not elevated           Normal rate with regular rhythm. No MRG.no LEE  ABDOMEN: Soft, non-tender, non-distended.  EXTREMITIES: Warm and well perfused.  NEUROLOGIC: No obvious FND  ICD interrogation (personally reviewed): as per HPI  ReDs reading: n/a    ASSESSMENT & PLAN: 1. CAD - prior anterior  MI w/ h/o PCI to mLAD and mLCX - Cath (10/23): 90% in-stent restenosis of mLAD and CTO mRCA with left-to-right collaterals. LCx stent widely patent. -> S/P PCI DES mid LAD. - LHC 12/24: Patent left main with mild nonobstructive  plaquing.  Patent LAD with moderate to severe focal in-stent restenosis in the distal stented segment, estimated at 70%. Widely patent circumflex stent. Totally occluded RCA with left-to-right collaterals, unchanged from the previous cath study. Plan for medical therapy for now.  - stable w/o angina  - Lipids managed by [facility]. LDL 90 2/25  2. 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 - Echo 07/30/24:  EF 25% markedly dilated. Mild RV dysfunction  - NYHA III w/ both exertional dyspnea and fatigue. Euvolemic on exam and device interrogation  - continue torsemide  60 mg bid  - Continue Toprol  XL 25 mg daily - Continue spironolactone  25 mg daily   - Unable to tolerate low dose losartan  d/t low BP and dizziness  - concern she may be nearing need for advanced therapies. Will arrange for CPX to risk stratify   3. SVT/PVCs - Zio 12/24:  Mostly NSR, but 25 runs of NSVT and 11.8% PVC burden. Started on Mexilitine - Continue mexiletine 200 mg bid - reports recent poor compliance w/ CPAP. She has been advised to improve  4. DM2 - improving control - Hgb A1c 13.1 (11/23)--> 8.5 (1/24)-> 6.2 (11/24) - Followed by PCP/ENDO - Continue SGLT2i and Ozempic    5. HL w/ LDL Goal < 55 - Statin intolerant. - Continue Repatha  + Zetia  - Followed by Lipid Clinic - No change   6. H/o Tobacco Use - Quit cigarettes, currently vapes  - She is cutting back - Discussed need for complete cessation  7. Obesity - There is no height or weight on file to calculate BMI. - Continue Ozempic   8. OSA - Sleep study 01/08/23 Moderate OSA  20.6 CSA 1.6 - Follows with Dr. Shlomo - needs to improve CPAP compliance  - no change  9. Carotid bruit - Carotid u/s with no significant dz  10. Iron deficiency - check CBC and Iron studies today, if deficient, will refer for IV Fe  F/u w/ Dr. Cherrie or APP post CPX testing in 4 wks     [name], PA-C  11:49 AM

## 2024-10-30 ENCOUNTER — Ambulatory Visit (HOSPITAL_COMMUNITY): Payer: Self-pay | Admitting: Cardiology

## 2024-11-24 ENCOUNTER — Other Ambulatory Visit (HOSPITAL_COMMUNITY): Payer: Self-pay

## 2024-11-24 ENCOUNTER — Ambulatory Visit: Payer: PPO

## 2024-11-24 DIAGNOSIS — I5022 Chronic systolic (congestive) heart failure: Secondary | ICD-10-CM

## 2024-11-24 MED ORDER — CLOPIDOGREL BISULFATE 75 MG PO TABS: 75.0000 mg | ORAL_TABLET | Freq: Every day | ORAL | 11 refills | Status: AC

## 2024-11-26 ENCOUNTER — Ambulatory Visit: Payer: Self-pay | Admitting: Cardiology

## 2024-11-26 ENCOUNTER — Ambulatory Visit (HOSPITAL_COMMUNITY): Attending: Internal Medicine

## 2024-11-26 DIAGNOSIS — I5022 Chronic systolic (congestive) heart failure: Secondary | ICD-10-CM | POA: Diagnosis present

## 2024-11-26 LAB — CUP PACEART REMOTE DEVICE CHECK
Battery Remaining Longevity: 156 mo
Battery Remaining Percentage: 100 %
Brady Statistic RV Percent Paced: 0 %
Date Time Interrogation Session: 20260106085000
HighPow Impedance: 76 Ohm
Implantable Lead Connection Status: 753985
Implantable Lead Implant Date: 20250106
Implantable Lead Location: 753860
Implantable Lead Model: 137
Implantable Lead Serial Number: 301618
Implantable Pulse Generator Implant Date: 20250106
Lead Channel Impedance Value: 443 Ohm
Lead Channel Pacing Threshold Amplitude: 0.6 V
Lead Channel Pacing Threshold Pulse Width: 0.4 ms
Lead Channel Setting Pacing Amplitude: 2.5 V
Lead Channel Setting Pacing Pulse Width: 0.4 ms
Lead Channel Setting Sensing Sensitivity: 0.5 mV
Pulse Gen Serial Number: 222540

## 2024-11-26 NOTE — Progress Notes (Signed)
 Remote ICD Transmission

## 2024-12-09 ENCOUNTER — Telehealth (HOSPITAL_COMMUNITY): Payer: Self-pay

## 2024-12-09 NOTE — Telephone Encounter (Signed)
 Called to confirm/remind patient of their appointment at the [facility] on 12/10/24.   Appointment:   [] Confirmed  [x] Left mess   [] No answer/No voice mail  [] VM Full/unable to leave message  [] Phone not in service  And to bring in all medications and/or complete list.

## 2024-12-10 ENCOUNTER — Other Ambulatory Visit (HOSPITAL_COMMUNITY): Payer: Self-pay | Admitting: Internal Medicine

## 2024-12-10 ENCOUNTER — Encounter (HOSPITAL_COMMUNITY): Payer: Self-pay

## 2024-12-10 ENCOUNTER — Other Ambulatory Visit (HOSPITAL_COMMUNITY): Payer: Self-pay

## 2024-12-10 ENCOUNTER — Ambulatory Visit (HOSPITAL_COMMUNITY)
Admission: RE | Admit: 2024-12-10 | Discharge: 2024-12-10 | Disposition: A | Discharge status: Home / Self Care | Source: Ambulatory Visit | Attending: Cardiology | Admitting: Cardiology

## 2024-12-10 ENCOUNTER — Ambulatory Visit (HOSPITAL_COMMUNITY)
Admission: RE | Admit: 2024-12-10 | Discharge: 2024-12-10 | Disposition: A | Discharge status: Home / Self Care | Source: Ambulatory Visit | Attending: Internal Medicine | Admitting: Internal Medicine

## 2024-12-10 VITALS — BP 102/64 | HR 93 | Wt 155.0 lb

## 2024-12-10 DIAGNOSIS — R002 Palpitations: Secondary | ICD-10-CM

## 2024-12-10 DIAGNOSIS — I493 Ventricular premature depolarization: Secondary | ICD-10-CM | POA: Insufficient documentation

## 2024-12-10 DIAGNOSIS — I471 Supraventricular tachycardia, unspecified: Secondary | ICD-10-CM | POA: Diagnosis not present

## 2024-12-10 DIAGNOSIS — I110 Hypertensive heart disease with heart failure: Secondary | ICD-10-CM | POA: Insufficient documentation

## 2024-12-10 DIAGNOSIS — Z91040 Latex allergy status: Secondary | ICD-10-CM | POA: Insufficient documentation

## 2024-12-10 DIAGNOSIS — Z7984 Long term (current) use of oral hypoglycemic drugs: Secondary | ICD-10-CM | POA: Diagnosis not present

## 2024-12-10 DIAGNOSIS — I25119 Atherosclerotic heart disease of native coronary artery with unspecified angina pectoris: Secondary | ICD-10-CM | POA: Diagnosis not present

## 2024-12-10 DIAGNOSIS — I2582 Chronic total occlusion of coronary artery: Secondary | ICD-10-CM | POA: Insufficient documentation

## 2024-12-10 DIAGNOSIS — F17290 Nicotine dependence, other tobacco product, uncomplicated: Secondary | ICD-10-CM | POA: Insufficient documentation

## 2024-12-10 DIAGNOSIS — Z794 Long term (current) use of insulin: Secondary | ICD-10-CM | POA: Diagnosis not present

## 2024-12-10 DIAGNOSIS — R0989 Other specified symptoms and signs involving the circulatory and respiratory systems: Secondary | ICD-10-CM | POA: Insufficient documentation

## 2024-12-10 DIAGNOSIS — I472 Ventricular tachycardia, unspecified: Secondary | ICD-10-CM | POA: Diagnosis not present

## 2024-12-10 DIAGNOSIS — J449 Chronic obstructive pulmonary disease, unspecified: Secondary | ICD-10-CM | POA: Insufficient documentation

## 2024-12-10 DIAGNOSIS — E669 Obesity, unspecified: Secondary | ICD-10-CM | POA: Insufficient documentation

## 2024-12-10 DIAGNOSIS — Z955 Presence of coronary angioplasty implant and graft: Secondary | ICD-10-CM | POA: Diagnosis not present

## 2024-12-10 DIAGNOSIS — I252 Old myocardial infarction: Secondary | ICD-10-CM | POA: Insufficient documentation

## 2024-12-10 DIAGNOSIS — E1151 Type 2 diabetes mellitus with diabetic peripheral angiopathy without gangrene: Secondary | ICD-10-CM | POA: Insufficient documentation

## 2024-12-10 DIAGNOSIS — G4733 Obstructive sleep apnea (adult) (pediatric): Secondary | ICD-10-CM | POA: Insufficient documentation

## 2024-12-10 DIAGNOSIS — R008 Other abnormalities of heart beat: Secondary | ICD-10-CM | POA: Insufficient documentation

## 2024-12-10 DIAGNOSIS — I739 Peripheral vascular disease, unspecified: Secondary | ICD-10-CM | POA: Diagnosis not present

## 2024-12-10 DIAGNOSIS — Z6828 Body mass index (BMI) 28.0-28.9, adult: Secondary | ICD-10-CM | POA: Insufficient documentation

## 2024-12-10 DIAGNOSIS — I5022 Chronic systolic (congestive) heart failure: Secondary | ICD-10-CM

## 2024-12-10 DIAGNOSIS — Z7985 Long-term (current) use of injectable non-insulin antidiabetic drugs: Secondary | ICD-10-CM | POA: Diagnosis not present

## 2024-12-10 DIAGNOSIS — G8929 Other chronic pain: Secondary | ICD-10-CM | POA: Diagnosis not present

## 2024-12-10 DIAGNOSIS — Z95810 Presence of automatic (implantable) cardiac defibrillator: Secondary | ICD-10-CM | POA: Diagnosis not present

## 2024-12-10 NOTE — Progress Notes (Signed)
 Orders placed for RHC with Dr. Bensimhon on 2/10/

## 2024-12-10 NOTE — Patient Instructions (Addendum)
 Medication Changes:  No Changes In Medications at this time.   Lab Work:  Labs done today, your results will be available in MyChart, we will contact you for abnormal readings.  Testing/Procedures:  Your physician has requested that you have a lower extremity arterial exercise duplex. During this test, exercise and ultrasound are used to evaluate arterial blood flow in the legs. Allow one hour for this exam. There are no restrictions or special instructions. SCHEDULING WILL REACH OUT TO YOU TO GET THIS SCHEDULED FOR YOU  Your provider has recommended that  you wear a Zio Patch for 14 days.  This monitor will record your heart rhythm for our review.  IF you have any symptoms while wearing the monitor please press the button.  If you have any issues with the patch or you notice a red or orange light on it please call the company at [phone].  Once you remove the patch please mail it back to the company as soon as possible so we can get the results.  Special Instructions // Education:  [facility] [address] [place] [address] Dept: [phone] Loc: [phone]  Julie Hernandez  12/10/2024  You are scheduled for a Cardiac Catheterization on Tuesday, February 10 with Dr. Toribio Fuel.  1. Please arrive at the [facility] (Main Entrance A) at [facility]: [address] [place], [place] [address] at 5:30 AM (This time is 2 hour(s) before your procedure to ensure your preparation).   Free valet parking service is available. You will check in at ADMITTING. The support person will be asked to wait in the waiting room.  It is OK to have someone drop you off and come back when you are ready to be discharged.    Special note: Every effort is made to have your procedure done on time. Please understand that emergencies sometimes delay scheduled procedures.  2. Diet: Nothing to eat after midnight.  NOTHING TO DRINK AFTER MIDNIGHT  4. Labs: You will need to have blood drawn on TODAY   5. Medication instructions in preparation for your procedure:   Contrast Allergy: No  DO NOT TAKE SPIRONOLACTONE  THE MORNING OF PROCEDURE   DO NOT TAKE TORSEMIDE  THE MORNING OF PROCEDURE   DO NOT TAKE JARDIANCE  THE MORNING OF PROCEDURE   DO NOT TAKE ANY METFORMIN  THE MORNING OF PROCEDURE  Do not take Diabetes Med Glucophage  (Metformin ) on the day of the procedure and HOLD 48 HOURS AFTER THE PROCEDURE.  6. Plan to go home the same day, you will only stay overnight if medically necessary. 7. Bring a current list of your medications and current insurance cards. 8. You MUST have a responsible person to drive you home. 9. Someone MUST be with you the first 24 hours after you arrive home or your discharge will be delayed. 10. Please wear clothes that are easy to get on and off and wear slip-on shoes.  Thank you for allowing us  to care for you!   -- [facility]   Follow-Up in: 4 WEEKS AS SCHEDULED   At the [facility], you and your health needs are our priority. We have a designated team specialized in the treatment of Heart Failure. This Care Team includes your primary Heart Failure Specialized Cardiologist (physician), Advanced Practice Providers (APPs- Physician Assistants and Nurse Practitioners), and Pharmacist who all work together to provide you with the care you  need, when you need it.   You may see any of the following providers on your designated Care Team at your next follow up:  Dr. Toribio Fuel Dr. Ezra Shuck Dr. Odis Brownie [name], NP [name], [name] [facility] [name], [name] [name], NP [name], NP [name], [name]   Please be sure to bring in all your medications bottles to every appointment.   Need to Contact Us :  If you have any questions or concerns before your next appointment please  send us  a message through [place] or call our office at [phone].    TO LEAVE A MESSAGE FOR THE NURSE SELECT OPTION 2, PLEASE LEAVE A MESSAGE INCLUDING: YOUR NAME DATE OF BIRTH CALL BACK NUMBER REASON FOR CALL**this is important as we prioritize the call backs  YOU WILL RECEIVE A CALL BACK THE SAME DAY AS LONG AS YOU CALL BEFORE 4:00 PM

## 2024-12-10 NOTE — Progress Notes (Signed)
 Zio patch placed onto patient.  All instructions and information reviewed with patient, they verbalize understanding with no questions.

## 2024-12-10 NOTE — Progress Notes (Signed)
 "   Advanced Heart Failure Clinic Note  Date:  12/10/24  ID:  KARRA PINK, DOB [birth year], MRN [id number]  Location: Home  Type of Visit: Established patient  PCP:  Sofie Lolita DEL, FNP  EP: Dr. Waddell HF Cardiologist: Dr. Cherrie   HPI: Hannelore is a 54 y.o. 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  Had anterior MI in 2010. LAD stent at [facility]. Went to [facility] that same year and had  a second stent placed. Did well from a cardiac perspective until 3/21.   Cath 02/08/20 at [facility] for NSTEMI. 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 Was told she may need repeat stenting vs CABG. Referred here for second opinion.   We saw her in 5/21 with daily angina. Underwent MRI which showed EF 26% only minimal viability in anterior wall. Decision to proceed with PCI of LCX and LAD (ISR) over CABG. Had successful PCI of LCX and LAD with Dr. Wonda.  Echo 9/21 EF 60-65%.  Echo 04/06/21 EF 50-55% mild anterior and apical HK  Admitted 10/23 with NSTEMI. Echo EF back down to 20% and RV mildly reduced. Underwent R/LHC which showed elevated right and left filling pressures and low CO;  occluded LAD stent and new CTO of RCA with L>>R collaterals. Underwent staged PCI with DES to mid LAD.   Seen in ED 12/25/22 with SVT  Echo 01/11/23 EF 25-30% with WMA c/w previous LAD infarct Sleep 01/08/23 Moderate OSA 20.6 CSA 1.6  cMRI 8/24 showed LVEF 26%, RVEF 39%, large anterior infarct--> referred to EP for ICD.  Zio 12/24:  Mostly NSR, but 25 runs of NSVT and 11.8% PVC burden. Started on Mexilitine  S/p ICD insertion 1/25.    Echo 07/30/24:  EF 25% markedly dilated. Mild RV dysfunction   CPX 1/26 demonstrated  moderate to severe functional limitation due to HF and obesity. The markedly elevated Ve/VCO2 slope (40) and blunted BP response to exercise suggested increased risk.   She returns today for f/u. Denies resting dyspnea but c/w exertional symptoms, NYHA Class III. Little improvement in exercise capacity despite being enrolled in cardiac rehab over the last several months. She denies CP but worried about PVCs. She was noted to have run of PVCs, bigeminy and trigeminy at the end of her CXP test. She still notes occasional palpitations despite full compliance w/ mexiletine. She has been unable to tolerate CPAP.   She quit smoking but still vapes.   Device interrogation shows HL Score 14. No VT. Activity level 0.8 hr/day, personally reviewed    Cardiac Studies CPX 1/26: Exercise testing with gas exchange demonstrates a moderate functional impairment with a peak VO2 of 13.6 ml/kg/min (57% of the age/gender/weight matched sedentary norms). The RER of 1.12 indicates a maximal effort. When adjusted to the patient's ideal body weight of 131.3 lb (59.6 kg) the peak VO2 is 16.5 ml/kg (ibw)/min (64% of the ibw-adjusted predicted). The VE/VCO2 slope is elevated. The oxygen uptake efficiency slope (OUES) is below predicted values (53% of predicted). The VO2 at the ventilatory threshold was low-normal/below normal at 40% of the predicted peak VO2. At peak exercise, the ventilation reached 42% of the measured MVV indicating ventilatory reserve  remained. The O2pulse (a surrogate for stroke volume) increased at the onset of exercise and quickly plateaus below threshold, peaking at 7.49ml/beat (70% of predicted).  - Moderate to severe  functional limitation due to HF and obesity. The markedly elevated Ve/VCO2 slope and blunted BP response to exercise suggest increased risk.   - LHC 12/24: Patent left main with mild nonobstructive plaquing.  Patent LAD with moderate to severe focal in-stent restenosis in the distal stented  segment, estimated at 70%. Widely patent circumflex stent. Totally occluded RCA with left-to-right collaterals, unchanged from the previous cath study.   - cMRI (8/24): showed LVEF 26%, RVEF 39%, large anterior infarct  - R/LHC (10/23): RA 23 mean, PA 48/35 mean 39, PCWP mean 30, CO/CI (Fick) 3.7/2 Severe 2v CAD with 90% in stent re-stenonsis of mid LAD and CTO of mid RCA with L>>R collaterals  - Echo (10/23): EF 20%, RV mildly down.  - Echo (5/22): EF 50-55%, mid anterior and apical HK  - Echo (9/21): EF 60-65%  - Zio (7/21) 1. Sinus rhythm - avg HR of 82 2. 13 Ventricular Tachycardia runs occurred, the run with the fastest interval lasting 12 beats with a max rate of 139 bpm (avg 110 bpm); the run with the fastest interval was also the longest 3. Very frequents PVCs (32.2%, [id number]), VE Couplets were rare (<1.0%, 2118),  4. Ventricular Bigeminy and Trigeminy were present. 5.. Multiple patient-triggered events associated  - cMRI (03/29/20) 1. Subendocardial late gadolinium enhancement consistent with prior infarct in the LAD territory. There is >50% transmural LGE suggesting nonviability in the basal to apical anterior wall and apex. There is <50% transmural LGE suggesting viability in the basal to mid anteroseptum and apical septum 2.  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 Normal LV size with severe systolic dysfunction (EF 26%). Akinesis of basal to apical anterior/anteroseptal walls and apex 4.  Small RV size with normal systolic function (EF 62%)     Past Medical History:  Diagnosis Date   CHF (congestive heart failure) (HCC)    Coronary artery disease    COVID 12/03/2020   Diabetes mellitus without complication (HCC)    Hypertension    OSA on CPAP    moderate obstructive sleep apnea with an AHI of 20.6/h and he was placed on auto CPAP from 4 to 15 cm H2O   Current Outpatient Medications  Medication Sig Dispense Refill   albuterol  (VENTOLIN  HFA) 108 (90 Base)  MCG/ACT inhaler Inhale 2 puffs into the lungs every 4 (four) hours as needed for wheezing or shortness of breath.     aspirin  EC 81 MG tablet Take 1 tablet (81 mg total) by mouth daily. 30 tablet 11   Aspirin -Acetaminophen -Caffeine (GOODY HEADACHE PO) Take 1 packet by mouth daily as needed (headaches).     blood glucose meter kit and supplies Dispense based on patient and insurance preference. Use up to four times daily as directed. (FOR ICD-10 E10.9, E11.9). 1 each 1   clopidogrel  (PLAVIX ) 75 MG tablet Take 1 tablet (75 mg total) by mouth daily. 30 tablet 11   Continuous Glucose Sensor (FREESTYLE LIBRE 3 PLUS SENSOR) MISC Use to monitor glucose continuously as directed. Change sensor every 15 days 6 each 3   empagliflozin  (JARDIANCE ) 10 MG TABS tablet Take 1 tablet (10 mg total) by mouth daily before breakfast. 90 tablet 3   escitalopram  (LEXAPRO ) 10 MG tablet Take 10 mg by mouth every evening.     Evolocumab  (REPATHA  SURECLICK) 140  MG/ML SOAJ Inject 140 mg into the skin every 14 (fourteen) days. 6 mL 3   ezetimibe  (ZETIA ) 10 MG tablet Take 1 tablet (10 mg total) by mouth daily at 6pm. 30 tablet 11   famotidine  (PEPCID ) 40 MG tablet Take 40 mg by mouth daily as needed for heartburn.     Insulin  Pen Needle 32G X 4 MM MISC Use with insulin  pen 100 each 0   metFORMIN  (GLUCOPHAGE ) 500 MG tablet Take 2 tablets (1,000 mg total) by mouth 2 (two) times daily with a meal. 360 tablet 3   metoprolol  succinate (TOPROL  XL) 25 MG 24 hr tablet Take 1 tablet (25 mg total) by mouth daily. 30 tablet 11   mexiletine (MEXITIL) 200 MG capsule Take 1 capsule (200 mg total) by mouth 2 (two) times daily. 60 capsule 11   nitroGLYCERIN  (NITROSTAT ) 0.4 MG SL tablet PLACE 1 TABLET UNDER THE TONGUE AND ALLOW TO DISSOLVE SLOWLY, DO NOT CHEW OR SWALLOW. MAY TAKE AN ADDITIONAL TABLET EVERY 5 MINUTES **MAX 3 TABLETS** 25 tablet 1   potassium chloride  SA (KLOR-CON  M) 20 MEQ tablet Take 2 tablets (40 mEq total) by mouth 2 (two) times  daily.     promethazine (PHENERGAN) 25 MG tablet promethazine 25 mg tablet  TAKE ONE TABLET BY MOUTH EVERY 6 HOURS AS NEEDED     RABEprazole (ACIPHEX) 20 MG tablet Take 20 mg by mouth 2 (two) times daily.     Semaglutide , 1 MG/DOSE, 4 MG/3ML SOPN Inject 1 mg as directed once a week. 9 mL 1   spironolactone  (ALDACTONE ) 25 MG tablet Take 1 tablet (25 mg total) by mouth daily. 30 tablet 11   torsemide  (DEMADEX ) 20 MG tablet Take 3 tablets (60 mg total) by mouth 2 (two) times daily. 180 tablet 2   losartan  (COZAAR ) 25 MG tablet Take 1 tablet (25 mg total) by mouth daily. (Patient not taking: Reported on 12/10/2024) 90 tablet 3   metolazone  (ZAROXOLYN ) 2.5 MG tablet Take 1 tablet (2.5 mg total) by mouth as directed. Take as directed by the heart failure clinic ONLY (Patient not taking: Reported on 12/10/2024) 6 tablet 0   No current facility-administered medications for this encounter.   Allergies  Allergen Reactions   Esomeprazole Magnesium Anaphylaxis   Ciprofibrate Itching   Ciprofloxacin Itching   Gabapentin Other (See Comments)    Achy - myalgia   Lubiprostone Diarrhea   Statins     Myalgia - flu like symptoms   Amlodipine Rash   Iron Rash and Swelling   Latex Rash and Swelling   Povidone Iodine Rash   Social History   Socioeconomic History   Marital status: Divorced    Spouse name: Not on file   Number of children: 2   Years of education: Not on file   Highest education level: Not on file  Occupational History   Occupation: On disability  Tobacco Use   Smoking status: Former    Current packs/day: 0.00    Types: Cigarettes    Quit date: 02/18/2020    Years since quitting: 4.8   Smokeless tobacco: Never  Vaping Use   Vaping status: Some Days  Substance and Sexual Activity   Alcohol  use: Not Currently    Comment: SOCIAL   Drug use: Never   Sexual activity: Not on file  Other Topics Concern   Not on file  Social History Narrative   Not on file   Social Drivers of  Health   Tobacco Use: Medium Risk (  12/10/2024)   Patient History    Smoking Tobacco Use: Former    Smokeless Tobacco Use: Never    Passive Exposure: Not on Actuary Strain: Not on file  Food Insecurity: Not on file  Transportation Needs: Not on file  Physical Activity: Not on file  Stress: Not on file  Social Connections: Not on file  Intimate Partner Violence: Not on file  Depression (PHQ2-9): Low Risk (10/31/2022)   Depression (PHQ2-9)    PHQ-2 Score: 0  Alcohol  Screen: Not on file  Housing: Not on file  Utilities: Not on file  Health Literacy: Not on file   FHx: M died at 67 from MI and HF D had stroke No brother and sisters  BP 102/64   Pulse 93   Wt 70.3 kg (155 lb)   SpO2 97%   BMI 28.35 kg/m   Wt Readings from Last 3 Encounters:  12/10/24 70.3 kg (155 lb)  10/29/24 68.9 kg (151 lb 12.8 oz)  07/30/24 68.5 kg (151 lb)   Physical Exam  GENERAL: NAD Lungs- clear  CARDIAC:  JVP not elevated         Normal rate with regular rhythm. No MRG. No LEE  ABDOMEN: Soft, non-tender, non-distended.  EXTREMITIES: Warm and well perfused.  NEUROLOGIC: No obvious FND   ICD interrogation (personally reviewed): as per HPI  ReDs reading: n/a    ASSESSMENT & PLAN: 1. CAD - prior anterior MI w/ h/o PCI to mLAD and mLCX - Cath (10/23): 90% in-stent restenosis of mLAD and CTO mRCA with left-to-right collaterals. LCx stent widely patent. -> S/P PCI DES mid LAD. - LHC 12/24: Patent left main with mild nonobstructive plaquing.  Patent LAD with moderate to severe focal in-stent restenosis in the distal stented segment, estimated at 70%. Widely patent circumflex stent. Totally occluded RCA with left-to-right collaterals, unchanged from the previous cath study. Plan for medical therapy for now.  - stable w/o CP  - statin intolerant. On Repatha  and Zetia , check LP and HFTs   2. Chronic Systolic Heart Failure due to iCM - Echo (10/23): EF down 20%, RV mildly reduced  in setting of LAD and RCA infarcts, previously placed mLAD stent w/ 99% stenosis, CTO mRCA w/ L>R collaterals  - RHC (10/23):  w/ elevated R+ L filling pressures and low output, RA 23, PCWP 30, CI 2.0  - Echo 01/11/23 EF 25-30% with WMA c/w previous LAD infarct - cMRI (8/24): LVEF 26%, RVEF 39%, large anterior infarct - s/p ICD 1/25. - Echo 07/30/24:  EF 25% markedly dilated. Mild RV dysfunction  - CPX 1/26 demonstrated moderate to severe functional limitation due to HF and obesity, peak VO2 of 13.6,  VE/VCO2 slope 40  - She continues w/ NYHA Class III symptoms, minimal improvement in exercise tolerance despite cardiac rehab.  - Plan RHC to assess hemodynamics. If low output, will began w/u for transplant vs VAD. Pt still vaping. She was informed that she will need to be nicotine free x 6 months to be considered for transplant. Encouraged to quit.   - Euvolemic on exam, continue torsemide  60 mg bid  - Continue Toprol  XL 25 mg daily - Continue spironolactone  25 mg daily   - Unable to tolerate low dose losartan  d/t low BP and dizziness  - Check BMP and proBNP today.   3. SVT/PVCs - Zio 12/24:  Mostly NSR, but 25 runs of NSVT and 11.8% PVC burden. Started on Mexilitine - On mexiletine 200 mg bid  but had runs of bigeminy/trigeminy during CPX  - repeat Zio to reassess burden  4. DM2 - improving control - Hgb A1c 13.1 (11/23)--> 8.5 (1/24)-> 6.2 (11/24) - Followed by PCP/ENDO - Continue SGLT2i and Ozempic    5. HL w/ LDL Goal < 55 - Statin intolerant. - Continue Repatha  + Zetia  - Check LP and HFTs today    6. H/o Tobacco Use - Quit cigarettes, currently vapes  - She is cutting back - Discussed need for complete cessation  7. Obesity - Body mass index is 28.35 kg/m. - Continue Ozempic   8. OSA - Sleep study 01/08/23 Moderate OSA 20.6 CSA 1.6 - Follows with Dr. Shlomo - needs to improve CPAP compliance  - no change  9. Carotid bruit - Carotid u/s with no significant dz  10. B/l  leg claudication  - risk for PAD given tobacco history, DM and known CAD - refer for b/l LE arterial dopplers w/ ABIs   F/u in 4 wks w/ APP or Dr. Cherrie [name], PA-C 12/10/2024    "

## 2024-12-11 ENCOUNTER — Other Ambulatory Visit (HOSPITAL_COMMUNITY): Payer: Self-pay | Admitting: Internal Medicine

## 2024-12-11 ENCOUNTER — Ambulatory Visit (HOSPITAL_COMMUNITY): Payer: Self-pay | Admitting: Cardiology

## 2024-12-11 LAB — PRO BRAIN NATRIURETIC PEPTIDE: Pro Brain Natriuretic Peptide: 2687 pg/mL — ABNORMAL HIGH

## 2024-12-11 LAB — COMPREHENSIVE METABOLIC PANEL WITH GFR
ALT: 19 U/L (ref 0–44)
AST: 25 U/L (ref 15–41)
Albumin: 4.8 g/dL (ref 3.5–5.0)
Alkaline Phosphatase: 76 U/L (ref 38–126)
Anion gap: 20 — ABNORMAL HIGH (ref 5–15)
BUN: 18 mg/dL (ref 6–20)
CO2: 26 mmol/L (ref 22–32)
Calcium: 10.8 mg/dL — ABNORMAL HIGH (ref 8.9–10.3)
Chloride: 95 mmol/L — ABNORMAL LOW (ref 98–111)
Creatinine, Ser: 1.11 mg/dL — ABNORMAL HIGH (ref 0.44–1.00)
GFR, Estimated: 59 mL/min — ABNORMAL LOW
Glucose, Bld: 50 mg/dL — ABNORMAL LOW (ref 70–99)
Potassium: 4.2 mmol/L (ref 3.5–5.1)
Sodium: 141 mmol/L (ref 135–145)
Total Bilirubin: 0.2 mg/dL (ref 0.0–1.2)
Total Protein: 7.4 g/dL (ref 6.5–8.1)

## 2024-12-11 LAB — LIPID PANEL
Cholesterol: 161 mg/dL (ref 0–200)
HDL: 40 mg/dL — ABNORMAL LOW
LDL Cholesterol: 96 mg/dL (ref 0–99)
Total CHOL/HDL Ratio: 4 ratio
Triglycerides: 125 mg/dL
VLDL: 25 mg/dL (ref 0–40)

## 2024-12-11 MED ORDER — TORSEMIDE 20 MG PO TABS: ORAL_TABLET | ORAL | 2 refills | Status: AC

## 2024-12-11 NOTE — Addendum Note (Signed)
 Encounter addended by: Constantino Starace B, RN on: 12/11/2024 9:23 AM  Actions taken: Order list changed, Diagnosis association updated

## 2024-12-16 ENCOUNTER — Other Ambulatory Visit (HOSPITAL_COMMUNITY): Payer: Self-pay | Admitting: Cardiology

## 2024-12-16 MED ORDER — MEXILETINE HCL 200 MG PO CAPS: 200.0000 mg | ORAL_CAPSULE | Freq: Two times a day (BID) | ORAL | 11 refills | Status: AC

## 2024-12-21 ENCOUNTER — Telehealth: Admitting: Nurse Practitioner

## 2024-12-25 ENCOUNTER — Ambulatory Visit (HOSPITAL_COMMUNITY): Admission: RE | Admit: 2024-12-25 | Source: Ambulatory Visit

## 2024-12-25 ENCOUNTER — Telehealth: Admitting: Nurse Practitioner

## 2024-12-25 ENCOUNTER — Encounter (HOSPITAL_COMMUNITY): Payer: Self-pay

## 2024-12-25 ENCOUNTER — Encounter: Payer: Self-pay | Admitting: Nurse Practitioner

## 2024-12-25 DIAGNOSIS — E782 Mixed hyperlipidemia: Secondary | ICD-10-CM

## 2024-12-25 DIAGNOSIS — I739 Peripheral vascular disease, unspecified: Secondary | ICD-10-CM

## 2024-12-25 DIAGNOSIS — Z7985 Long-term (current) use of injectable non-insulin antidiabetic drugs: Secondary | ICD-10-CM

## 2024-12-25 DIAGNOSIS — Z7984 Long term (current) use of oral hypoglycemic drugs: Secondary | ICD-10-CM

## 2024-12-25 DIAGNOSIS — E1165 Type 2 diabetes mellitus with hyperglycemia: Secondary | ICD-10-CM

## 2024-12-25 DIAGNOSIS — E1159 Type 2 diabetes mellitus with other circulatory complications: Secondary | ICD-10-CM

## 2024-12-25 LAB — VAS US ABI WITH/WO TBI
Left ABI: 0.96
Right ABI: 1.01

## 2024-12-25 MED ORDER — ACCU-CHEK SOFTCLIX LANCETS MISC: 12 refills | Status: AC

## 2024-12-25 MED ORDER — ACCU-CHEK GUIDE TEST VI STRP: ORAL_STRIP | 12 refills | Status: AC

## 2024-12-25 MED ORDER — METFORMIN HCL 500 MG PO TABS: 1000.0000 mg | ORAL_TABLET | Freq: Two times a day (BID) | ORAL | 3 refills | Status: AC

## 2024-12-25 MED ORDER — EMPAGLIFLOZIN 10 MG PO TABS: 10.0000 mg | ORAL_TABLET | Freq: Every day | ORAL | 3 refills | Status: AC

## 2024-12-25 MED ORDER — TIRZEPATIDE 7.5 MG/0.5ML ~~LOC~~ SOAJ: 7.5000 mg | SUBCUTANEOUS | 1 refills | Status: AC

## 2024-12-25 MED ORDER — ACCU-CHEK GUIDE ME W/DEVICE KIT: PACK | 0 refills | Status: AC

## 2024-12-25 NOTE — Progress Notes (Signed)
 "                                                                        Endocrinology Follow Up Note       12/25/2024, 12:02 PM   TELEHEALTH VISIT: The patient is being engaged in telehealth visit.  This type of visit limits physical examination significantly, and thus is not preferable over face-to-face encounters.  I connected with  Julie Hernandez on 12/25/24 by a video enabled telemedicine application and verified that I am speaking with the correct person using two identifiers.   I discussed the limitations of evaluation and management by telemedicine. The patient expressed understanding and agreed to proceed.    The participants involved in this visit include: Benton JINNY Rio, NP located at [facility] and Julie Hernandez  located at their personal residence listed.   Subjective:    Patient ID: Julie Hernandez, female    DOB: 1971-04-19.  Julie Hernandez is being seen in follow up after being seen in consultation for management of currently uncontrolled symptomatic diabetes requested by  Sofie Lolita DEL, FNP.   Past Medical History:  Diagnosis Date   CHF (congestive heart failure) (HCC)    Coronary artery disease    COVID 12/03/2020   Diabetes mellitus without complication (HCC)    Hypertension    OSA on CPAP    moderate obstructive sleep apnea with an AHI of 20.6/h and he was placed on auto CPAP from 4 to 15 cm H2O    Past Surgical History:  Procedure Laterality Date   CORONARY BALLOON ANGIOPLASTY N/A 04/07/2020   Procedure: CORONARY BALLOON ANGIOPLASTY;  Surgeon: Wonda Sharper, MD;  Location: [facility] INVASIVE CV LAB;  Service: Cardiovascular;  Laterality: N/A;   CORONARY PRESSURE/FFR STUDY N/A 04/07/2020   Procedure: INTRAVASCULAR PRESSURE WIRE/FFR STUDY;  Surgeon: Wonda Sharper, MD;  Location: [facility] INVASIVE CV LAB;  Service: Cardiovascular;  Laterality: N/A;   CORONARY STENT INTERVENTION N/A 04/07/2020   Procedure: CORONARY STENT INTERVENTION;   Surgeon: Wonda Sharper, MD;  Location: [facility] INVASIVE CV LAB;  Service: Cardiovascular;  Laterality: N/A;   CORONARY STENT INTERVENTION N/A 09/18/2022   Procedure: CORONARY STENT INTERVENTION;  Surgeon: Wonda Sharper, MD;  Location: [facility] INVASIVE CV LAB;  Service: Cardiovascular;  Laterality: N/A;   CORONARY ULTRASOUND/IVUS N/A 04/07/2020   Procedure: Intravascular Ultrasound/IVUS;  Surgeon: Wonda Sharper, MD;  Location: [facility] INVASIVE CV LAB;  Service: Cardiovascular;  Laterality: N/A;   ICD IMPLANT N/A 11/25/2023   Procedure: ICD IMPLANT;  Surgeon: Waddell Danelle ORN, MD;  Location: [facility] INVASIVE CV LAB;  Service: Cardiovascular;  Laterality: N/A;   LEFT HEART CATH AND CORONARY ANGIOGRAPHY N/A 04/07/2020   Procedure: LEFT HEART CATH AND CORONARY ANGIOGRAPHY;  Surgeon: Wonda Sharper, MD;  Location: [facility] INVASIVE CV LAB;  Service: Cardiovascular;  Laterality: N/A;   LEFT HEART CATH AND CORONARY ANGIOGRAPHY N/A 04/14/2021   Procedure: LEFT HEART CATH AND CORONARY ANGIOGRAPHY;  Surgeon: Cherrie Toribio SAUNDERS, MD;  Location: [facility] INVASIVE CV LAB;  Service: Cardiovascular;  Laterality: N/A;   LEFT HEART CATH AND CORONARY ANGIOGRAPHY N/A 11/18/2023   Procedure: LEFT HEART CATH AND CORONARY ANGIOGRAPHY;  Surgeon: Wonda Sharper, MD;  Location: [facility] INVASIVE CV LAB;  Service: Cardiovascular;  Laterality: N/A;   RIGHT/LEFT HEART CATH AND CORONARY ANGIOGRAPHY N/A 09/17/2022   Procedure: RIGHT/LEFT HEART CATH AND CORONARY ANGIOGRAPHY;  Surgeon: Mady Bruckner, MD;  Location: [facility] INVASIVE CV LAB;  Service: Cardiovascular;  Laterality: N/A;    Social History   Socioeconomic History   Marital status: Divorced    Spouse name: Not on file   Number of children: 2   Years of education: Not on file   Highest education level: Not on file  Occupational History   Occupation: On disability  Tobacco Use   Smoking status: Former    Current packs/day: 0.00    Types: Cigarettes    Quit date: 02/18/2020    Years since quitting:  4.8   Smokeless tobacco: Never  Vaping Use   Vaping status: Some Days  Substance and Sexual Activity   Alcohol  use: Not Currently    Comment: SOCIAL   Drug use: Never   Sexual activity: Not on file  Other Topics Concern   Not on file  Social History Narrative   Not on file   Social Drivers of Health   Tobacco Use: Medium Risk (12/25/2024)   Patient History    Smoking Tobacco Use: Former    Smokeless Tobacco Use: Never    Passive Exposure: Not on Actuary Strain: Not on file  Food Insecurity: Not on file  Transportation Needs: Not on file  Physical Activity: Not on file  Stress: Not on file  Social Connections: Not on file  Depression (PHQ2-9): Low Risk (10/31/2022)   Depression (PHQ2-9)    PHQ-2 Score: 0  Alcohol  Screen: Not on file  Housing: Not on file  Utilities: Not on file  Health Literacy: Not on file    Family History  Problem Relation Age of Onset   Diabetes Mother    Hypertension Mother    Heart disease Mother    Hyperlipidemia Mother     Outpatient Encounter Medications as of 12/25/2024  Medication Sig   Accu-Chek Softclix Lancets lancets Use as instructed to monitor glucose twice daily   ALPRAZolam (XANAX) 0.5 MG tablet Take 1 mg by mouth 2 (two) times daily as needed for anxiety.   aspirin  EC 81 MG tablet Take 1 tablet (81 mg total) by mouth daily.   Aspirin -Acetaminophen -Caffeine (GOODY HEADACHE PO) Take 1 packet by mouth daily as needed (headaches).   blood glucose meter kit and supplies Dispense based on patient and insurance preference. Use up to four times daily as directed. (FOR ICD-10 E10.9, E11.9).   Blood Glucose Monitoring Suppl (ACCU-CHEK GUIDE ME) w/Device KIT Use to check glucose twice daily   clopidogrel  (PLAVIX ) 75 MG tablet Take 1 tablet (75 mg total) by mouth daily.   Continuous Glucose Sensor (FREESTYLE LIBRE 3 PLUS SENSOR) MISC Use to monitor glucose continuously as directed. Change sensor every 15 days   escitalopram   (LEXAPRO ) 10 MG tablet Take 10 mg by mouth every evening.   ezetimibe  (ZETIA ) 10 MG tablet Take 1 tablet (10 mg total) by mouth daily at 6pm.   famotidine  (PEPCID ) 40 MG tablet Take 40 mg by mouth daily as needed for heartburn.   glucose blood (ACCU-CHEK GUIDE TEST) test strip Use as instructed to monitor glucose twice daily   Insulin  Pen Needle 32G X 4 MM MISC Use with insulin  pen   metoprolol  succinate (TOPROL  XL) 25 MG 24 hr tablet Take 1 tablet (25 mg total) by mouth daily.   mexiletine (MEXITIL) 200 MG capsule Take  1 capsule (200 mg total) by mouth 2 (two) times daily.   nitroGLYCERIN  (NITROSTAT ) 0.4 MG SL tablet PLACE 1 TABLET UNDER THE TONGUE AND ALLOW TO DISSOLVE SLOWLY, DO NOT CHEW OR SWALLOW. MAY TAKE AN ADDITIONAL TABLET EVERY 5 MINUTES **MAX 3 TABLETS**   potassium chloride  SA (KLOR-CON  M) 20 MEQ tablet Take 2 tablets (40 mEq total) by mouth 2 (two) times daily.   promethazine (PHENERGAN) 25 MG tablet Take 25 mg by mouth every 6 (six) hours as needed for vomiting or nausea.   RABEprazole (ACIPHEX) 20 MG tablet Take 20 mg by mouth 2 (two) times daily.   spironolactone  (ALDACTONE ) 25 MG tablet Take 1 tablet (25 mg total) by mouth daily.   tirzepatide  (MOUNJARO ) 7.5 MG/0.5ML Pen Inject 7.5 mg into the skin once a week.   torsemide  (DEMADEX ) 20 MG tablet Take 4 tablets (80 mg total) by mouth every morning for 3 days, THEN 3 tablets (60 mg total) every evening for 3 days, THEN 3 tablets (60 mg total) 2 (two) times daily.   [DISCONTINUED] empagliflozin  (JARDIANCE ) 10 MG TABS tablet Take 1 tablet (10 mg total) by mouth daily before breakfast.   [DISCONTINUED] metFORMIN  (GLUCOPHAGE ) 500 MG tablet Take 2 tablets (1,000 mg total) by mouth 2 (two) times daily with a meal.   [DISCONTINUED] Semaglutide , 1 MG/DOSE, 4 MG/3ML SOPN Inject 1 mg as directed once a week.   empagliflozin  (JARDIANCE ) 10 MG TABS tablet Take 1 tablet (10 mg total) by mouth daily before breakfast.   Evolocumab  (REPATHA   SURECLICK) 140 MG/ML SOAJ Inject 140 mg into the skin every 14 (fourteen) days. (Patient not taking: Reported on 12/25/2024)   losartan  (COZAAR ) 25 MG tablet Take 1 tablet (25 mg total) by mouth daily. (Patient not taking: Reported on 12/25/2024)   metFORMIN  (GLUCOPHAGE ) 500 MG tablet Take 2 tablets (1,000 mg total) by mouth 2 (two) times daily with a meal.   metolazone  (ZAROXOLYN ) 2.5 MG tablet Take 1 tablet (2.5 mg total) by mouth as directed. Take as directed by the heart failure clinic ONLY (Patient not taking: Reported on 12/25/2024)   No facility-administered encounter medications on file as of 12/25/2024.    ALLERGIES: Allergies  Allergen Reactions   Esomeprazole Magnesium Anaphylaxis   Ciprofibrate Itching   Ciprofloxacin Itching   Gabapentin Other (See Comments)    Achy - myalgia   Lubiprostone Diarrhea   Statins     Myalgia - flu like symptoms   Amlodipine Rash   Iron Rash and Swelling   Latex Rash and Swelling   Povidone Iodine Rash    VACCINATION STATUS:  There is no immunization history on file for this patient.  Diabetes She presents for her follow-up diabetic visit. She has type 2 diabetes mellitus. Onset time: diagnosed with GD at ate 27 then diabetes at age 67. Her disease course has been worsening. There are no hypoglycemic associated symptoms. Associated symptoms include fatigue. There are no hypoglycemic complications. Symptoms are stable. 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 Risk factors for coronary artery disease include tobacco exposure, diabetes mellitus, dyslipidemia, family history, obesity and hypertension. Current diabetic treatment includes oral agent (dual therapy) (And Ozempic ). She is compliant with treatment most of the time. Her weight is decreasing steadily. She is following a generally healthy diet. Meal planning includes avoidance of concentrated sweets. She has not had a previous visit with a dietitian. She participates in  exercise three times a week. Her home blood glucose trend is increasing  steadily. Her overall blood glucose range is 140-180 mg/dl. (She presents today for her virtual visit with her CGM showing at goal glycemic profile overall- however slowly trending upwards.  She notes she was sick around Christmas and was eventually prescribed steroids to help improve her symptoms.  Since then she has not been able to get back on track.  Was unable to perform A1c today due to nature of visit.  Analysis of her CGM shows TIR 72%, TAR 28%, TBR 0% with a GMI of 7.2%.   She is being prepared for her LVAD coming up.) An ACE inhibitor/angiotensin II receptor blocker is not being taken. She sees a podiatrist.Eye exam is not current (due now).     Review of systems  Constitutional: + increasing body weight, current Body mass index is 28.9 kg/m., no fatigue, no subjective hyperthermia, no subjective hypothermia Eyes: no blurry vision, no xerophthalmia ENT: no sore throat, no nodules palpated in throat, no dysphagia/odynophagia, no hoarseness Cardiovascular: no chest pain, no shortness of breath, no palpitations, no leg swelling Respiratory: no cough, no shortness of breath Gastrointestinal: no nausea/vomiting/diarrhea, + intermittent constipation(Hx IBS-C) Musculoskeletal: no muscle/joint aches Skin: no rashes, no hyperemia Neurological: no tremors, no numbness, no tingling, no dizziness Psychiatric: no depression, no anxiety  Objective:     BP 100/60 (BP Location: Left Arm, Patient Position: Sitting, Cuff Size: Large) Comment: this morning at [facility]  Ht 5' 2 (1.575 m)   Wt 158 lb (71.7 kg) Comment: per patient  BMI 28.90 kg/m   Wt Readings from Last 3 Encounters:  12/25/24 158 lb (71.7 kg)  12/10/24 155 lb (70.3 kg)  10/29/24 151 lb 12.8 oz (68.9 kg)     BP Readings from Last 3 Encounters:  12/25/24 100/60  12/10/24 102/64  10/29/24 90/70     Physical Exam- Telehealth- significantly limited  due to nature of visit  Constitutional: Body mass index is 28.9 kg/m. , not in acute distress, normal state of mind Respiratory: Adequate breathing efforts   Diabetic Foot Exam - Simple   No data filed     CMP ( most recent) CMP     Component Value Date/Time   NA 141 12/10/2024 1524   NA 142 11/11/2023 1426   K 4.2 12/10/2024 1524   CL 95 (L) 12/10/2024 1524   CO2 26 12/10/2024 1524   GLUCOSE 50 (L) 12/10/2024 1524   BUN 18 12/10/2024 1524   BUN 24 11/11/2023 1426   CREATININE 1.11 (H) 12/10/2024 1524   CALCIUM 10.8 (H) 12/10/2024 1524   PROT 7.4 12/10/2024 1524   PROT 6.2 06/21/2020 0922   ALBUMIN 4.8 12/10/2024 1524   ALBUMIN 4.2 06/21/2020 0922   AST 25 12/10/2024 1524   ALT 19 12/10/2024 1524   ALKPHOS 76 12/10/2024 1524   BILITOT 0.2 12/10/2024 1524   BILITOT <0.2 06/21/2020 0922   GFRNONAA 59 (L) 12/10/2024 1524   GFRAA >60 07/26/2020 1208     Diabetic Labs (most recent): Lab Results  Component Value Date   HGBA1C 6.6 (A) 06/18/2024   HGBA1C 6.9 (A) 02/17/2024   HGBA1C 6.2 (A) 09/30/2023     Lipid Panel ( most recent) Lipid Panel     Component Value Date/Time   CHOL 161 12/10/2024 1524   CHOL 113 06/21/2020 0922   TRIG 125 12/10/2024 1524   HDL 40 (L) 12/10/2024 1524   HDL 33 (L) 06/21/2020 0922   CHOLHDL 4.0 12/10/2024 1524   VLDL 25 12/10/2024 1524   LDLCALC  96 12/10/2024 1524   LDLCALC 54 06/21/2020 0922   LABVLDL 26 06/21/2020 0922      Lab Results  Component Value Date   TSH 1.461 11/05/2023   TSH 1.125 12/28/2022           Assessment & Plan:   1) Type 2 diabetes mellitus with hyperglycemia, with long-term current use of insulin  (HCC)  She presents today for her virtual visit with her CGM showing at goal glycemic profile overall- however slowly trending upwards.  She notes she was sick around Christmas and was eventually prescribed steroids to help improve her symptoms.  Since then she has not been able to get back on track.   Was unable to perform A1c today due to nature of visit.  Analysis of her CGM shows TIR 72%, TAR 28%, TBR 0% with a GMI of 7.2%.   She is being prepared for her LVAD coming up.  - Julie Hernandez has currently uncontrolled symptomatic type 2 DM since 54 years of age.   -Recent labs reviewed.  - I had a long discussion with her about the progressive nature of diabetes and the pathology behind its complications. 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 These are all discussed in detail with her.  The following Lifestyle Medicine recommendations according to American College of Lifestyle Medicine [facility]) were discussed and offered to patient and she agrees to start the journey:  A. Whole Foods, Plant-based plate comprising of fruits and vegetables, plant-based proteins, whole-grain carbohydrates was discussed in detail with the patient.   A list for source of those nutrients were also provided to the patient.  Patient will use only water or unsweetened tea for hydration. B.  The need to stay away from risky substances including alcohol , smoking; obtaining 7 to 9 hours of restorative sleep, at least 150 minutes of moderate intensity exercise weekly, the importance of healthy social connections,  and stress reduction techniques were discussed. C.  A full color page of  Calorie density of various food groups per pound showing examples of each food groups was provided to the patient.  - Nutritional counseling repeated at each appointment due to patients tendency to fall back in to old habits.  - The patient admits there is a room for improvement in their diet and drink choices. -  Suggestion is made for the patient to avoid simple carbohydrates from their diet including Cakes, Sweet Desserts / Pastries, Ice Cream, Soda (diet and regular), Sweet Tea,  Candies, Chips, Cookies, Sweet Pastries, Store Bought Juices, Alcohol  in Excess of 1-2 drinks a day, Artificial Sweeteners, Coffee Creamer, and Sugar-free Products. This will help patient to have stable blood glucose profile and potentially avoid unintended weight gain.   - I encouraged the patient to switch to unprocessed or minimally processed complex starch and increased protein intake (animal or plant source), fruits, and vegetables.   - Patient is advised to stick to a routine mealtimes to eat 3 meals a day and avoid unnecessary snacks (to snack only to correct hypoglycemia).  - I have approached her with the following individualized plan to manage her diabetes and patient agrees:   -She is advised continue Metformin  1000 mg po twice daily with meals and continue Jardiance  10 mg po daily (per cardiology recommendations).   Will try switching her to Mounjaro  7.5  mg SQ weekly.  Novo[employer]ordisk removed Ozempic  from patient assistance, thus will try something new since it has to go through pharmacy.  -she is encouraged to continue monitoring glucose 4 times daily (using her CGM), before meals and before bed, and to call the clinic if she has readings less than 70 or above 300 for 3 tests in a row.  I also sent in back up meter for her today as well.  - Adjustment parameters are given to her for hypo and hyperglycemia in writing.  She is excellent candidate for incretin therapy with GLP1 given her MI history.  - Specific targets for  A1c; LDL, HDL, and Triglycerides were discussed with the patient.  2) Blood Pressure /Hypertension:  her blood pressure is controlled to target.   she is advised to continue her current medications as prescribed by cardiology.  3) Lipids/Hyperlipidemia:    Review of her recent lipid panel from 12/23/23 showed controlled LDL at 90.  she is advised to continue Zetia  10 mg daily at bedtime.  She has intolerance to statins.  4)  Weight/Diet:  her Body mass index is 28.9  kg/m.  -  clearly complicating her diabetes care.   she is a candidate for weight loss. I discussed with her the fact that loss of 5 - 10% of her  current body weight will have the most impact on her diabetes management.  Exercise, and detailed carbohydrates information provided  -  detailed on discharge instructions.  5) Chronic Care/Health Maintenance: -she is not on ACEI/ARB or Statin medications and is encouraged to initiate and continue to follow up with Ophthalmology, Dentist, Podiatrist at least yearly or according to recommendations, and advised to [facility] altogether . I have recommended yearly flu vaccine and pneumonia vaccine at least every 5 years; moderate intensity exercise for up to 150 minutes weekly; and sleep for at least 7 hours a day.  - she is advised to maintain close follow up with Sofie Lolita DEL, FNP for primary care needs, as well as her other providers for optimal and coordinated care.     I spent 32 minutes dedicated to the care of this patient on the date of this encounter to include pre-visit review of records, face-to-face time with the patient, and post visit ordering of testing.    Please refer to Patient Instructions for Blood Glucose Monitoring and Insulin /Medications Dosing Guide  in media tab for additional information. Please  also refer to  Patient Self Inventory in the Media  tab for reviewed elements of pertinent patient history.  Julie Hernandez participated in the discussions, expressed understanding, and voiced agreement with the above plans.  All questions were answered to her satisfaction. she is encouraged to contact clinic should she have any questions or concerns prior to her return visit.     Follow up plan: - Return in about 4 months (around 04/24/2025) for Diabetes F/U with A1c in office, No previsit labs, Bring meter and logs.  Benton Rio, [facility] [address] [place], [place]  [address] Phone: [phone] Fax: [phone]  12/25/2024, 12:02 PM    "

## 2024-12-29 ENCOUNTER — Ambulatory Visit (HOSPITAL_COMMUNITY): Admit: 2024-12-29 | Admitting: Internal Medicine

## 2024-12-29 ENCOUNTER — Encounter (HOSPITAL_COMMUNITY): Payer: Self-pay

## 2024-12-29 SURGERY — RIGHT HEART CATH
Anesthesia: LOCAL

## 2025-01-07 ENCOUNTER — Ambulatory Visit (HOSPITAL_COMMUNITY): Admitting: Internal Medicine

## 2025-04-26 ENCOUNTER — Ambulatory Visit: Admitting: Nurse Practitioner
